# Patient Record
Sex: Female | Born: 1985
Health system: Southern US, Community
[De-identification: ages and names within clinical notes are randomized; demographics above are authoritative.]

## PROBLEM LIST (undated history)

## (undated) DIAGNOSIS — E039 Hypothyroidism, unspecified: Secondary | ICD-10-CM

## (undated) DIAGNOSIS — N946 Dysmenorrhea, unspecified: Secondary | ICD-10-CM

## (undated) DIAGNOSIS — D649 Anemia, unspecified: Secondary | ICD-10-CM

## (undated) DIAGNOSIS — F32A Depression, unspecified: Secondary | ICD-10-CM

## (undated) DIAGNOSIS — G43909 Migraine, unspecified, not intractable, without status migrainosus: Secondary | ICD-10-CM

## (undated) DIAGNOSIS — D069 Carcinoma in situ of cervix, unspecified: Secondary | ICD-10-CM

## (undated) DIAGNOSIS — N879 Dysplasia of cervix uteri, unspecified: Secondary | ICD-10-CM

## (undated) DIAGNOSIS — F419 Anxiety disorder, unspecified: Secondary | ICD-10-CM

## (undated) DIAGNOSIS — R87619 Unspecified abnormal cytological findings in specimens from cervix uteri: Secondary | ICD-10-CM

## (undated) HISTORY — PX: COLPOSCOPY: SHX161

## (undated) HISTORY — DX: Dysplasia of cervix uteri, unspecified: N87.9

## (undated) HISTORY — DX: Unspecified abnormal cytological findings in specimens from cervix uteri: R87.619

## (undated) HISTORY — DX: Dysmenorrhea, unspecified: N94.6

## (undated) HISTORY — DX: Carcinoma in situ of cervix, unspecified: D06.9

---
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
## 2005-02-05 ENCOUNTER — Emergency Department: Payer: Self-pay | Admitting: Unknown Physician Specialty

## 2005-04-19 ENCOUNTER — Observation Stay: Payer: Self-pay | Admitting: Obstetrics & Gynecology

## 2005-07-05 ENCOUNTER — Inpatient Hospital Stay: Payer: Self-pay

## 2009-06-06 ENCOUNTER — Ambulatory Visit: Payer: Self-pay

## 2010-01-01 ENCOUNTER — Emergency Department: Payer: Self-pay | Admitting: Unknown Physician Specialty

## 2010-12-20 DIAGNOSIS — D069 Carcinoma in situ of cervix, unspecified: Secondary | ICD-10-CM

## 2010-12-20 HISTORY — PX: LEEP: SHX91

## 2010-12-20 HISTORY — DX: Carcinoma in situ of cervix, unspecified: D06.9

## 2013-05-26 HISTORY — PX: CHOLECYSTECTOMY: SHX55

## 2014-07-18 ENCOUNTER — Ambulatory Visit: Payer: Self-pay | Admitting: Emergency Medicine

## 2014-07-18 IMAGING — US ABDOMEN ULTRASOUND LIMITED
1 series · 14 of 25 positions shown · non-contrast
Comparison: None.

CLINICAL DATA: Epigastric pain for 3 day

EXAM:
US ABDOMEN LIMITED - RIGHT UPPER QUADRANT

[Series 1: abdomen ultrasound limited · 0.20mm/px · 14 of 64 slices shown]
[im 1/64]
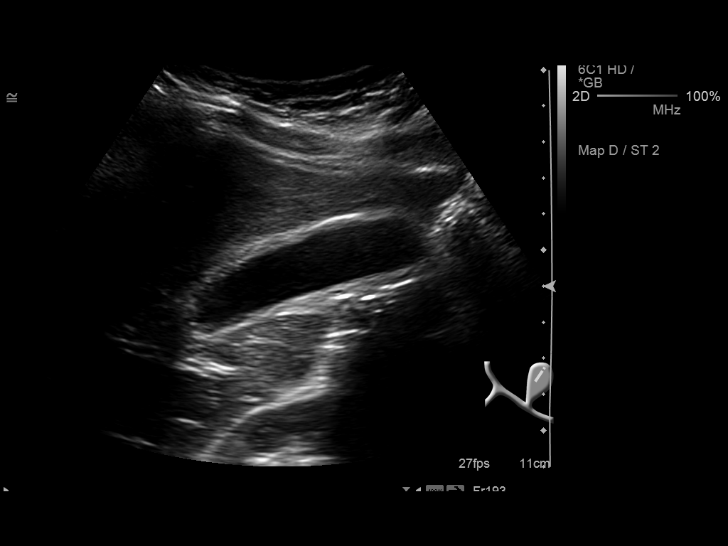
[im 6/64]
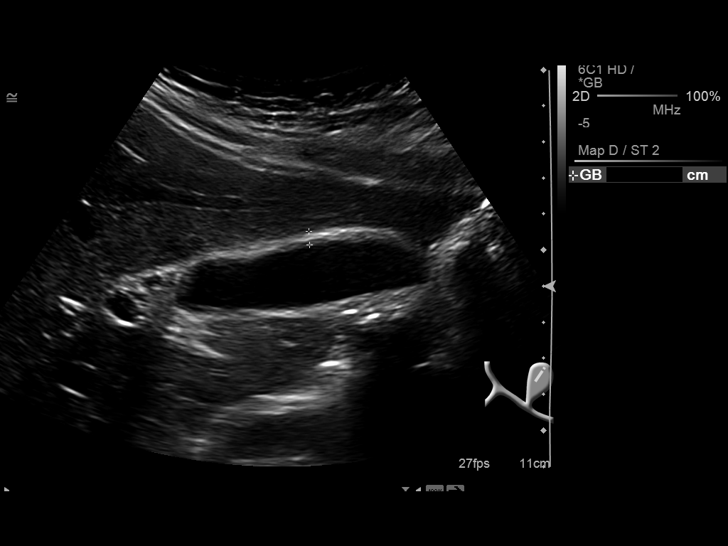
[im 11/64]
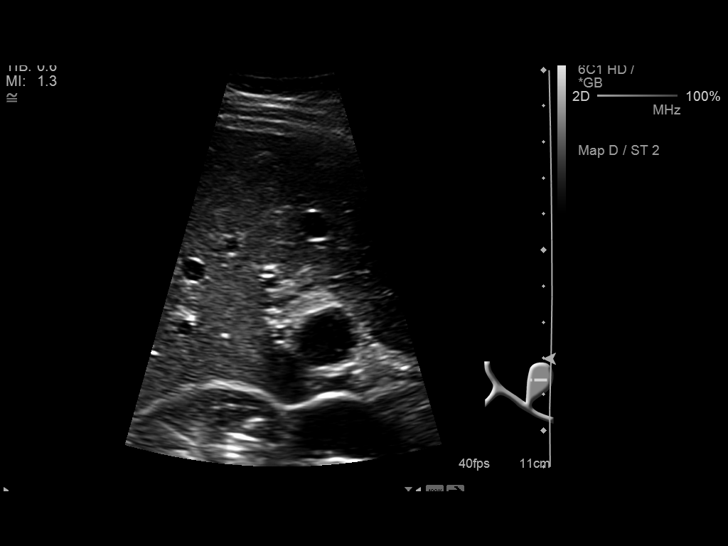
[im 16/64]
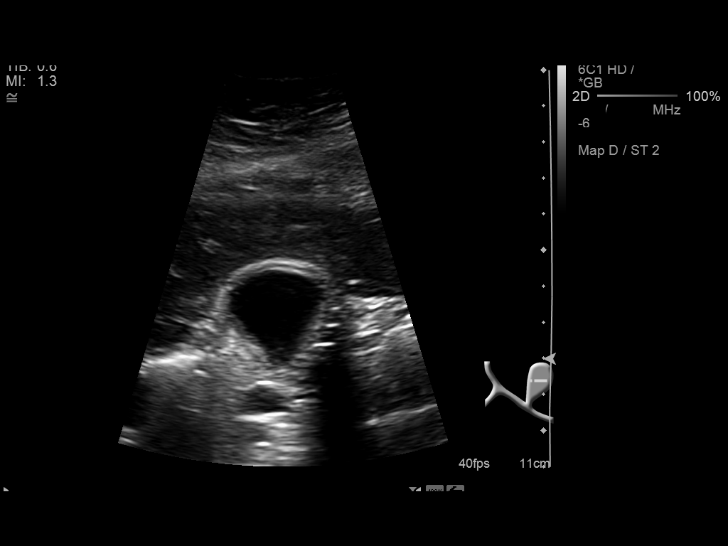
[im 22/64]
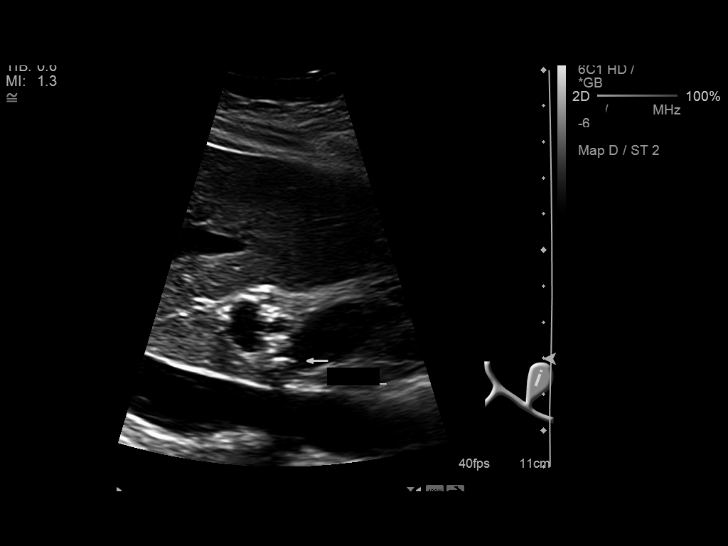
[im 24/64]
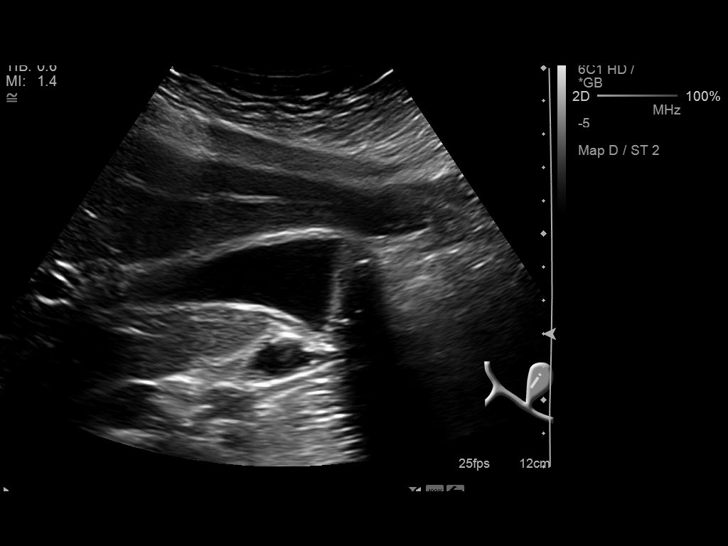
[im 29/64]
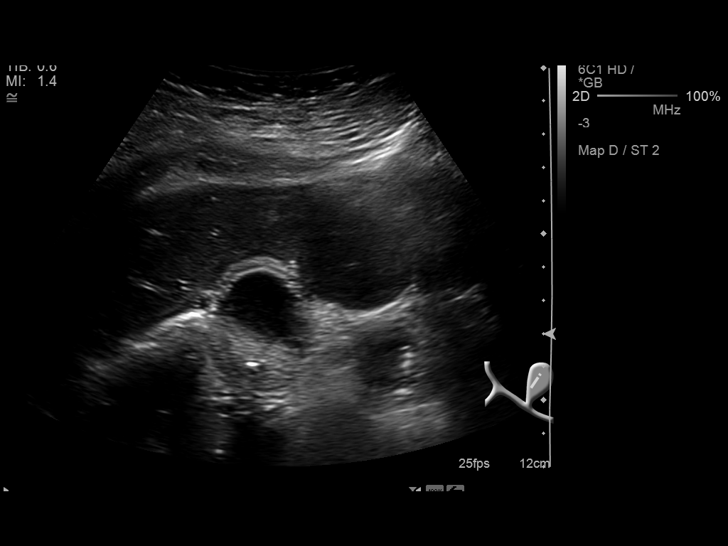
[im 35/64]
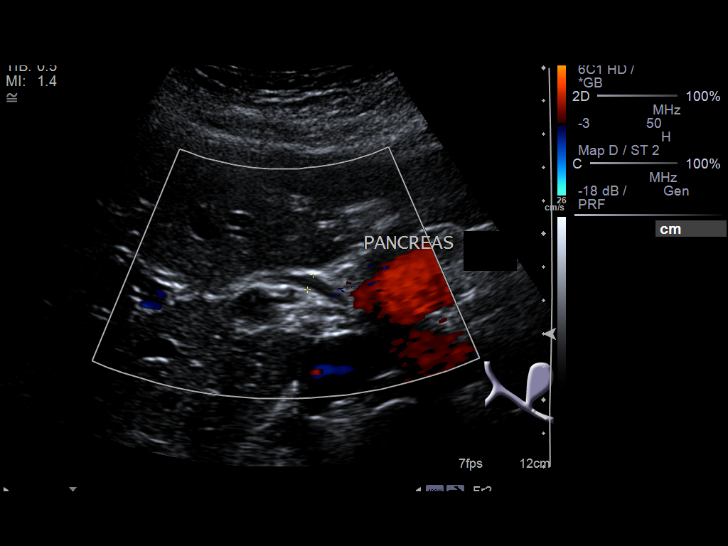
[im 40/64]
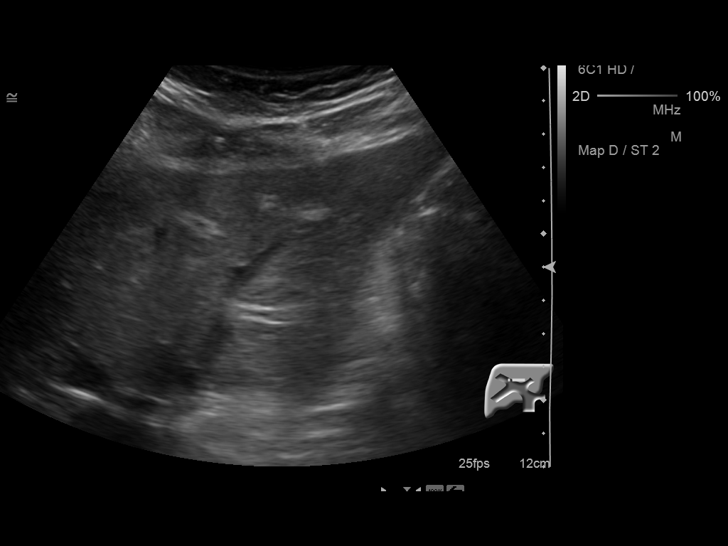
[im 43/64]
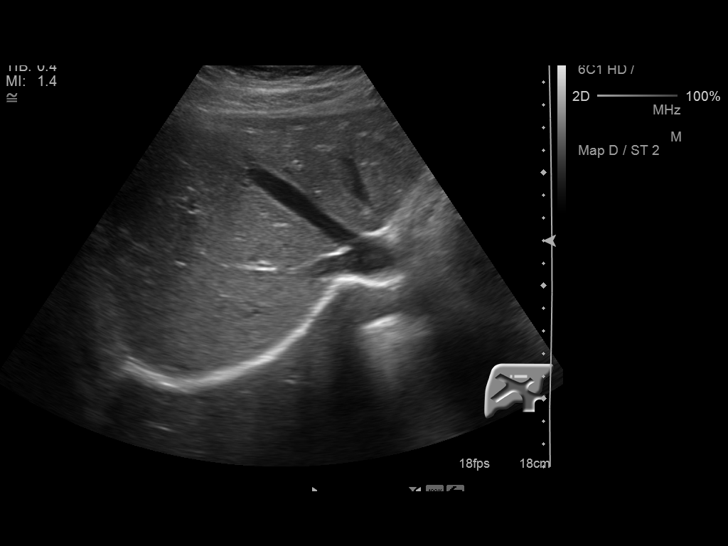
[im 48/64]
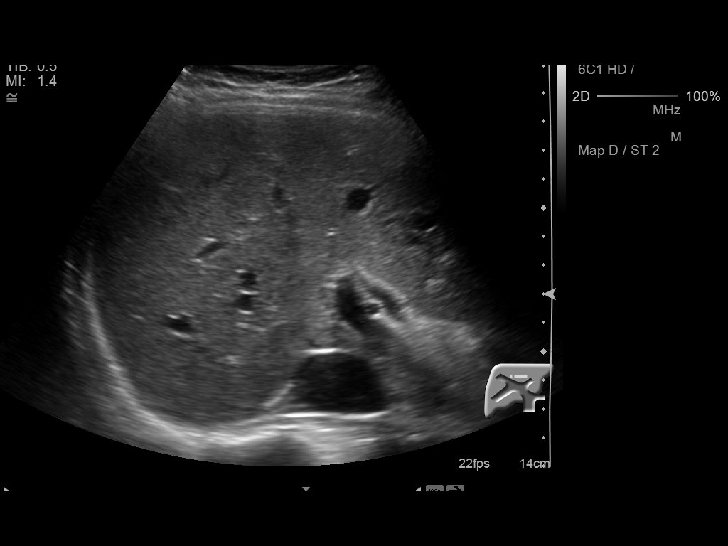
[im 53/64]
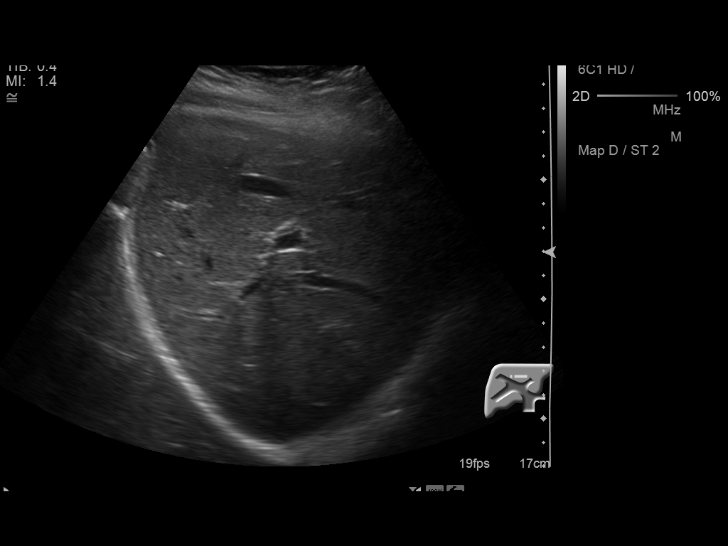
[im 58/64]
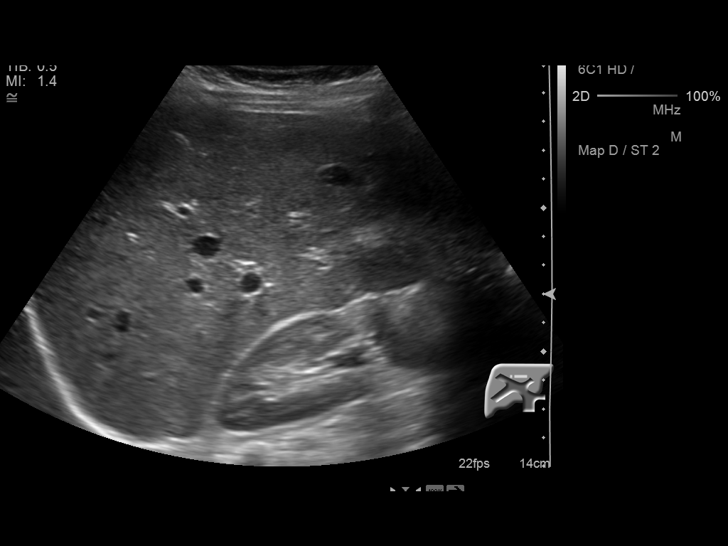
[im 64/64]
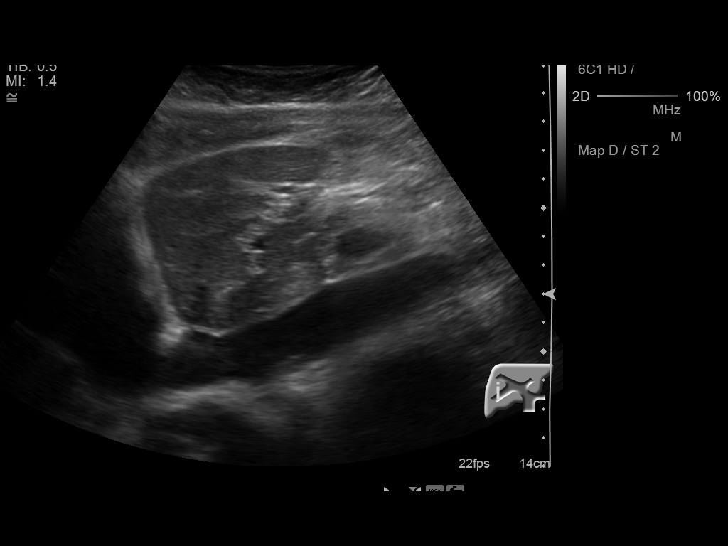

[14 of 25 positions shown; findings below may reference images not displayed]

FINDINGS: Gallbladder:

Several mobile small gallstones are present. There is a nonmobile
gallstone in the neck. The wall of the gallbladder is thickened
measuring 4 mm. A small amount of pericholecystic fluid is present.
Murphy sign was negative but the patient was given pain medication.

Common bile duct:

Diameter: 7 mm

Liver:

No focal lesion identified. Within normal limits in parenchymal
echogenicity.
IMPRESSION: Cholelithiasis.

The gallbladder is thickened with some pericholecystic fluid.
Correlate clinically as for the need for nuclear medicine imaging to
evaluate for cystic duct patency.

The common bile duct is mildly dilated. A common bile duct stone is
not excluded. Correlate with liver function tests as for the need
for MRCP.

## 2014-07-20 ENCOUNTER — Ambulatory Visit: Payer: Self-pay | Admitting: Surgery

## 2014-09-18 LAB — SURGICAL PATHOLOGY

## 2014-09-24 NOTE — Op Note (Signed)
PATIENT NAME:  Christy Whitehead, Trang M MR#:  [id number] DATE OF BIRTH:  Jul 01, 1985  DATE OF PROCEDURE:  07/20/2014  ATTENDING PHYSICIAN: Cristal Deerhristopher A. Hilman Kissling, MD   PREOPERATIVE DIAGNOSIS: Acute cholecystitis.   POSTOPERATIVE DIAGNOSIS: Acute cholecystitis.  PROCEDURE PERFORMED: Laparoscopic cholecystectomy.   ANESTHESIA: General.   ESTIMATED BLOOD LOSS: 25 mL.   COMPLICATIONS: None.   SPECIMENS: Gallbladder.   INDICATION FOR SURGERY: Ms. [name] is a 29 year old female who presents with 4 days of abdominal pain and ultrasound that shows an impacted stone at the neck of her gallbladder as well as an elevated white blood cell count. She was brought to the operating room for laparoscopic cholecystectomy.   DETAILS OF PROCEDURE: Informed consent was obtained. Ms. [name] was brought to the operating room suite. She was induced. Endotracheal tube was placed, general anesthesia was administered. Her abdomen was then prepped and draped in standard surgical fashion. A timeout was then performed correctly identifying the patient name, operative site and procedure to be performed.   A supraumbilical incision was made deepened down to the fascia. The fascia was incised. The peritoneum was entered. Two stay sutures were placed through the fasciotomy. Hassan trocar was placed in the abdomen. The abdomen was insufflated. An 11 mm epigastric and two 5 mm subcostal trocars were placed. The colon was then lifted up over the gallbladder fossa. It did have moderate inflammation. The cystic duct and cystic artery were dissected out. They were clipped and ligated, after the critical view was obtained. Gallbladder was then taken off the gallbladder fossa. It was inadvertently entered and there was creamy what looked like purulence that came out of the gallbladder. It was then brought out through an Endo Catch bag. The abdomen was then irrigated and hemostasis was obtained. Following this, the abdomen was  desufflated. All trocars were taken out under direct visualization. The supraumbilical fascia was closed with figure-of-eight 0 Vicryl. All skin sites were then closed with 4-0 Monocryl deep dermal. The wounds were dressed with Steri-Strips, Telfa gauze and Tegaderm. The patient was then awoken, extubated and brought to the postanesthesia care unit. There were no immediate complications. Needle, sponge, and instrument counts were correct at the end of the procedure.     ____________________________ Si Raiderhristopher A. Aryahna Spagna, MD cal:TM D: 07/20/2014 13:37:21 ET T: 07/21/2014 00:16:13 ET JOB#: [id number]  cc: Cristal Deerhristopher A. Neysha Criado, MD, <Dictator> Jarvis NewcomerHRISTOPHER A Angle Karel MD ELECTRONICALLY SIGNED 08/01/2014 11:25

## 2015-09-22 ENCOUNTER — Emergency Department (HOSPITAL_COMMUNITY)
Admission: EM | Admit: 2015-09-22 | Discharge: 2015-09-22 | Disposition: A | Discharge status: Home / Self Care | Payer: 59 | Attending: Emergency Medicine | Admitting: Emergency Medicine

## 2015-09-22 ENCOUNTER — Encounter (HOSPITAL_COMMUNITY): Payer: Self-pay | Admitting: *Deleted

## 2015-09-22 DIAGNOSIS — R21 Rash and other nonspecific skin eruption: Secondary | ICD-10-CM | POA: Diagnosis present

## 2015-09-22 DIAGNOSIS — L509 Urticaria, unspecified: Secondary | ICD-10-CM | POA: Diagnosis not present

## 2015-09-22 DIAGNOSIS — R110 Nausea: Secondary | ICD-10-CM | POA: Insufficient documentation

## 2015-09-22 DIAGNOSIS — G43909 Migraine, unspecified, not intractable, without status migrainosus: Secondary | ICD-10-CM | POA: Insufficient documentation

## 2015-09-22 HISTORY — DX: Migraine, unspecified, not intractable, without status migrainosus: G43.909

## 2015-09-22 MED ORDER — HYDROXYZINE HCL 25 MG PO TABS
25.0000 mg | ORAL_TABLET | Freq: Once | ORAL | Status: AC
  Administered 2015-09-22: 25 mg via ORAL
  Filled 2015-09-22: qty 1

## 2015-09-22 MED ORDER — HYDROXYZINE HCL 25 MG PO TABS: 25.0000 mg | ORAL_TABLET | Freq: Four times a day (QID) | ORAL | Status: DC | PRN

## 2015-09-22 MED ORDER — PREDNISONE 20 MG PO TABS
60.0000 mg | ORAL_TABLET | Freq: Once | ORAL | Status: AC
  Administered 2015-09-22: 60 mg via ORAL
  Filled 2015-09-22: qty 3

## 2015-09-22 MED ORDER — PREDNISONE 20 MG PO TABS: 40.0000 mg | ORAL_TABLET | Freq: Every day | ORAL | Status: DC

## 2015-09-22 NOTE — ED Notes (Signed)
PT reports she was spraying the yard with bug spray . Pt reports an allergic reaction to spray . Pt reports hives all over.

## 2015-09-22 NOTE — ED Notes (Signed)
Pt verbalized understanding of d/c instructions. Pt in NAD. Airway intact. Pt d/c home with family to follow up with pcp. Pt educated on prescription use.

## 2015-09-22 NOTE — ED Provider Notes (Signed)
CSN: [id number]     Arrival date & time 09/22/15  [id number] History  By signing my name below, I, Christy Whitehead, attest that this documentation has been prepared under the direction and in the presence of Roxy Horsemanobert Adah Stoneberg, PA-C. Electronically Signed: Angelene GiovanniEmmanuella Whitehead, ED Scribe. 09/22/2015. 7:12 PM.    No chief complaint on file.  The history is provided by the patient. No language interpreter was used.   HPI Comments: Christy Whitehead is a 30 y.o. female who presents to the Emergency Department complaining of gradually spreading itchy hives to her upper extremities s/p possible allergic reaction that occurred PTA. She reports associated nausea. She explains that she was working in the yard prior to onset. She states that she took Benadryl and Pepcid with mild relief. Pt denies any fever, chills, wheezing, SOB, or vomiting.    No past medical history on file. No past surgical history on file. No family history on file. Social History  Substance Use Topics  . Smoking status: Not on file  . Smokeless tobacco: Not on file  . Alcohol Use: Not on file   OB History    No data available     Review of Systems  Constitutional: Negative for fever and chills.  Respiratory: Negative for shortness of breath and wheezing.   Gastrointestinal: Positive for nausea. Negative for vomiting.  Skin: Positive for rash.      Allergies  Review of patient's allergies indicates not on file.  Home Medications   Prior to Admission medications   Not on File   BP 130/78 mmHg  Pulse 102  Temp(Src) 98 F (36.7 C) (Oral)  Resp 18  SpO2 100% Physical Exam  Constitutional: She is oriented to person, place, and time. She appears well-developed and well-nourished.  HENT:  Head: Normocephalic and atraumatic.  Oropharynx is clear, nonedematous, no stridor, airway intact  Eyes: Conjunctivae and EOM are normal. Pupils are equal, round, and reactive to light.  Neck: Normal range of motion. Neck  supple.  Cardiovascular: Normal rate and regular rhythm.  Exam reveals no gallop and no friction rub.   No murmur heard. Pulmonary/Chest: Effort normal and breath sounds normal. No respiratory distress. She has no wheezes. She has no rales. She exhibits no tenderness.  Clear to auscultation bilaterally  Abdominal: Soft. Bowel sounds are normal. She exhibits no distension and no mass. There is no tenderness. There is no rebound and no guarding.  Musculoskeletal: Normal range of motion. She exhibits no edema or tenderness.  Neurological: She is alert and oriented to person, place, and time.  Skin: Skin is warm and dry.  Diffuse urticaria on extremities and torso  Psychiatric: She has a normal mood and affect. Her behavior is normal. Judgment and thought content normal.  Nursing note and vitals reviewed.   ED Course  Procedures (including critical care time) DIAGNOSTIC STUDIES: Oxygen Saturation is 100% on RA, normal by my interpretation.    COORDINATION OF CARE: 7:10 PM- Pt advised of plan for treatment and pt agrees. Pt will receive Prednisone and Atarax for relief.   MDM   Final diagnoses:  Urticaria    Patient with diffuse urticaria.  No wheezing.  No v/d.  No throat swelling.  Patient took benadryl and pepcid at home.  Starting to get some relief.  Will give prednisone and atarax.  Patient seen by and discussed with Dr. Criss AlvineGoldston, who agrees with the plan.  7:51 PM Patient reassessed. Feeling improved. No wheezing. Airway is patent. Not in any  apparent distress.  I personally performed the services described in this documentation, which was scribed in my presence. The recorded information has been reviewed and is accurate.     Roxy Horseman, PA-C 09/22/15 1951  Pricilla Loveless, MD 09/26/15 [phone]

## 2015-09-22 NOTE — Discharge Instructions (Signed)
Hives Hives are itchy, red, swollen areas of the skin. They can vary in size and location on your body. Hives can come and go for hours or several days (acute hives) or for several weeks (chronic hives). Hives do not spread from person to person (noncontagious). They may get worse with scratching, exercise, and emotional stress. CAUSES   Allergic reaction to food, additives, or drugs.  Infections, including the common cold.  Illness, such as vasculitis, lupus, or thyroid disease.  Exposure to sunlight, heat, or cold.  Exercise.  Stress.  Contact with chemicals. SYMPTOMS   Red or white swollen patches on the skin. The patches may change size, shape, and location quickly and repeatedly.  Itching.  Swelling of the hands, feet, and face. This may occur if hives develop deeper in the skin. DIAGNOSIS  Your caregiver can usually tell what is wrong by performing a physical exam. Skin or blood tests may also be done to determine the cause of your hives. In some cases, the cause cannot be determined. TREATMENT  Mild cases usually get better with medicines such as antihistamines. Severe cases may require an emergency epinephrine injection. If the cause of your hives is known, treatment includes avoiding that trigger.  HOME CARE INSTRUCTIONS   Avoid causes that trigger your hives.  Take antihistamines as directed by your caregiver to reduce the severity of your hives. Non-sedating or low-sedating antihistamines are usually recommended. Do not drive while taking an antihistamine.  Take any other medicines prescribed for itching as directed by your caregiver.  Wear loose-fitting clothing.  Keep all follow-up appointments as directed by your caregiver. SEEK MEDICAL CARE IF:   You have persistent or severe itching that is not relieved with medicine.  You have painful or swollen joints. SEEK IMMEDIATE MEDICAL CARE IF:   You have a fever.  Your tongue or lips are swollen.  You have  trouble breathing or swallowing.  You feel tightness in the throat or chest.  You have abdominal pain. These problems may be the first sign of a life-threatening allergic reaction. Call your local emergency services (911 in [place] MAKE SURE YOU:   Understand these instructions.  Will watch your condition.  Will get help right away if you are not doing well or get worse.   This information is not intended to replace advice given to you by your health care provider. Make sure you discuss any questions you have with your health care provider.   Document Released: 05/12/2005 Document Revised: 05/17/2013 Document Reviewed: 08/05/2011 Elsevier Interactive Patient Education 2016 [employer] Inc.  

## 2015-10-02 DIAGNOSIS — G43719 Chronic migraine without aura, intractable, without status migrainosus: Secondary | ICD-10-CM | POA: Diagnosis not present

## 2016-03-20 DIAGNOSIS — H5213 Myopia, bilateral: Secondary | ICD-10-CM | POA: Diagnosis not present

## 2016-05-13 DIAGNOSIS — G43019 Migraine without aura, intractable, without status migrainosus: Secondary | ICD-10-CM | POA: Diagnosis not present

## 2016-05-13 DIAGNOSIS — G43719 Chronic migraine without aura, intractable, without status migrainosus: Secondary | ICD-10-CM | POA: Diagnosis not present

## 2016-06-06 DIAGNOSIS — Z3044 Encounter for surveillance of vaginal ring hormonal contraceptive device: Secondary | ICD-10-CM | POA: Diagnosis not present

## 2016-06-06 DIAGNOSIS — Z01419 Encounter for gynecological examination (general) (routine) without abnormal findings: Secondary | ICD-10-CM | POA: Diagnosis not present

## 2016-06-06 DIAGNOSIS — Z124 Encounter for screening for malignant neoplasm of cervix: Secondary | ICD-10-CM | POA: Diagnosis not present

## 2016-06-06 DIAGNOSIS — N393 Stress incontinence (female) (male): Secondary | ICD-10-CM | POA: Diagnosis not present

## 2016-06-06 DIAGNOSIS — Z8742 Personal history of other diseases of the female genital tract: Secondary | ICD-10-CM | POA: Diagnosis not present

## 2016-06-06 DIAGNOSIS — Z1151 Encounter for screening for human papillomavirus (HPV): Secondary | ICD-10-CM | POA: Diagnosis not present

## 2016-08-28 ENCOUNTER — Ambulatory Visit: Payer: 59 | Admitting: Physical Therapy

## 2016-09-04 ENCOUNTER — Ambulatory Visit: Payer: 59 | Attending: Obstetrics and Gynecology | Admitting: Physical Therapy

## 2016-09-11 ENCOUNTER — Encounter: Payer: 59 | Admitting: Physical Therapy

## 2016-09-16 ENCOUNTER — Encounter: Payer: 59 | Admitting: Physical Therapy

## 2016-09-25 ENCOUNTER — Encounter: Payer: 59 | Admitting: Physical Therapy

## 2016-10-07 ENCOUNTER — Encounter: Payer: 59 | Admitting: Physical Therapy

## 2016-10-09 ENCOUNTER — Encounter: Payer: 59 | Admitting: Physical Therapy

## 2016-10-23 ENCOUNTER — Encounter: Payer: 59 | Admitting: Physical Therapy

## 2016-11-06 ENCOUNTER — Encounter: Payer: 59 | Admitting: Physical Therapy

## 2017-02-26 DIAGNOSIS — G43719 Chronic migraine without aura, intractable, without status migrainosus: Secondary | ICD-10-CM | POA: Diagnosis not present

## 2017-02-26 DIAGNOSIS — G43019 Migraine without aura, intractable, without status migrainosus: Secondary | ICD-10-CM | POA: Diagnosis not present

## 2017-03-27 DIAGNOSIS — H5213 Myopia, bilateral: Secondary | ICD-10-CM | POA: Diagnosis not present

## 2017-04-23 DIAGNOSIS — G43019 Migraine without aura, intractable, without status migrainosus: Secondary | ICD-10-CM | POA: Diagnosis not present

## 2017-04-23 DIAGNOSIS — G43719 Chronic migraine without aura, intractable, without status migrainosus: Secondary | ICD-10-CM | POA: Diagnosis not present

## 2017-07-02 ENCOUNTER — Ambulatory Visit
Admission: EM | Admit: 2017-07-02 | Discharge: 2017-07-02 | Disposition: A | Discharge status: Home / Self Care | Payer: 59 | Attending: Family Medicine | Admitting: Family Medicine

## 2017-07-02 ENCOUNTER — Encounter: Payer: Self-pay | Admitting: *Deleted

## 2017-07-02 ENCOUNTER — Other Ambulatory Visit: Payer: Self-pay

## 2017-07-02 DIAGNOSIS — J029 Acute pharyngitis, unspecified: Secondary | ICD-10-CM | POA: Diagnosis not present

## 2017-07-02 DIAGNOSIS — B349 Viral infection, unspecified: Secondary | ICD-10-CM | POA: Diagnosis not present

## 2017-07-02 DIAGNOSIS — R0981 Nasal congestion: Secondary | ICD-10-CM | POA: Diagnosis not present

## 2017-07-02 DIAGNOSIS — R51 Headache: Secondary | ICD-10-CM

## 2017-07-02 LAB — RAPID STREP SCREEN (MED CTR MEBANE ONLY): STREPTOCOCCUS, GROUP A SCREEN (DIRECT): NEGATIVE

## 2017-07-02 NOTE — ED Provider Notes (Signed)
[facility]    CSN: [id number] Arrival date & time: 07/02/17  0818     History   Chief Complaint Chief Complaint  Patient presents with  . Sore Throat  . Nasal Congestion  . Headache    HPI [name] is a 32 y.o. female.   The history is provided by the patient. No language interpreter was used.  Sore Throat  This is a new problem. The current episode started 2 days ago. The problem occurs constantly. The problem has not changed since onset.Associated symptoms include headaches. Nothing aggravates the symptoms. Treatments tried: cold and flu OTC. The treatment provided mild relief.    Past Medical History:  Diagnosis Date  . Migraines     Patient Active Problem List   Diagnosis Date Noted  . Acute pharyngitis 07/02/2017  . Viral illness 07/02/2017  . Migraines     Past Surgical History:  Procedure Laterality Date  . CHOLECYSTECTOMY      OB History    No data available       Home Medications    Prior to Admission medications   Medication Sig Start Date End Date Taking? Authorizing Provider  hydrOXYzine (ATARAX/VISTARIL) 25 MG tablet Take 1 tablet (25 mg total) by mouth every 6 (six) hours as needed. 09/22/15   Roxy HorsemanBrowning, Robert, PA-C  predniSONE (DELTASONE) 20 MG tablet Take 2 tablets (40 mg total) by mouth daily. 09/22/15   Roxy HorsemanBrowning, Robert, PA-C    Family History Family History  Problem Relation Age of Onset  . Diabetes Father   . Hypertension Father     Social History Social History   Tobacco Use  . Smoking status: Never Smoker  . Smokeless tobacco: Never Used  Substance Use Topics  . Alcohol use: Yes    Comment: social  . Drug use: No     Allergies   Patient has no known allergies.   Review of Systems Review of Systems  Constitutional: Negative for fever.  HENT: Positive for congestion and sore throat.   Eyes: Negative.   Respiratory: Negative for cough and wheezing.   Cardiovascular: Negative.     Gastrointestinal: Negative.   Musculoskeletal: Positive for myalgias.  Skin: Negative.   Neurological: Positive for headaches.  Hematological: Negative.   Psychiatric/Behavioral: Negative.   All other systems reviewed and are negative.    Physical Exam Triage Vital Signs ED Triage Vitals  Enc Vitals Group     BP 07/02/17 0904 118/78     Pulse Rate 07/02/17 0904 73     Resp 07/02/17 0904 16     Temp 07/02/17 0904 98.1 F (36.7 C)     Temp Source 07/02/17 0904 Oral     SpO2 07/02/17 0904 99 %     Weight 07/02/17 0906 158 lb (71.7 kg)     Height 07/02/17 0906 5\' 4"  (1.626 m)     Head Circumference --      Peak Flow --      Pain Score 07/02/17 0906 7     Pain Loc --      Pain Edu? --      Excl. in GC? --    No data found.  Updated Vital Signs BP 118/78 (BP Location: Left Arm)   Pulse 73   Temp 98.1 F (36.7 C) (Oral)   Resp 16   Ht 5\' 4"  (1.626 m)   Wt 158 lb (71.7 kg)   LMP 06/03/2017   SpO2 99%   BMI 27.12 kg/m  Visual Acuity  Physical Exam  Constitutional: She is oriented to person, place, and time. She appears well-developed and well-nourished. She is active and cooperative. No distress.  HENT:  Head: Normocephalic.  Mouth/Throat: Oropharynx is clear and moist.  Eyes: Conjunctivae, EOM and lids are normal. Pupils are equal, round, and reactive to light.  Neck: Normal range of motion. No tracheal deviation present.  Cardiovascular: Regular rhythm, normal heart sounds and normal pulses.  No murmur heard. Pulmonary/Chest: Effort normal and breath sounds normal.  Abdominal: Soft. Bowel sounds are normal. There is no tenderness.  Musculoskeletal: Normal range of motion.  Lymphadenopathy:    She has no cervical adenopathy.  Neurological: She is alert and oriented to person, place, and time. GCS eye subscore is 4. GCS verbal subscore is 5. GCS motor subscore is 6.  Skin: Skin is warm and dry. No rash noted.  Psychiatric: She has a normal mood and affect. Her  speech is normal and behavior is normal.  Nursing note and vitals reviewed.    UC Treatments / Results  Labs (all labs ordered are listed, but only abnormal results are displayed) Labs Reviewed  RAPID STREP SCREEN (NOT AT [facility])  CULTURE, GROUP A STREP [facility])    EKG  EKG Interpretation None       Radiology No results found.  Procedures Procedures (including critical care time)  Medications Ordered in UC Medications - No data to display   Initial Impression / Assessment and Plan / UC Course  I have reviewed the triage vital signs and the nursing notes.  Pertinent labs & imaging results that were available during my care of the patient were reviewed by me and considered in my medical decision making (see chart for details).    Rest,push fluids, take meds as directed. Your strep test was negative in office today,culture pending. Return to UC as needed. Pt verbalized understanding to this provider. Work note given.   Final Clinical Impressions(s) / UC Diagnoses   Final diagnoses:  Acute pharyngitis, unspecified etiology  Viral illness    ED Discharge Orders    None       Controlled Substance Prescriptions    Denym Rahimi, NP 07/02/17 1052

## 2017-07-02 NOTE — ED Triage Notes (Signed)
Patient started having symptoms of sore throat, nasal congestion, and headache 2 days ago. Patient has a history of migraine headaches.

## 2017-07-02 NOTE — Discharge Instructions (Addendum)
Rest,push fluids, take meds as directed. Your strep test was negative in office today,culture pending. Return to UC as needed.

## 2017-07-04 LAB — CULTURE, GROUP A STREP (THRC)

## 2017-08-18 ENCOUNTER — Other Ambulatory Visit: Payer: Self-pay | Admitting: Obstetrics and Gynecology

## 2017-08-19 ENCOUNTER — Other Ambulatory Visit: Payer: Self-pay | Admitting: Obstetrics and Gynecology

## 2017-08-19 ENCOUNTER — Telehealth: Payer: Self-pay

## 2017-08-19 MED ORDER — ETONOGESTREL-ETHINYL ESTRADIOL 0.12-0.015 MG/24HR VA RING: VAGINAL_RING | VAGINAL | 0 refills | Status: DC

## 2017-08-19 NOTE — Telephone Encounter (Signed)
Rx eRxd. RN to notify pt

## 2017-08-19 NOTE — Progress Notes (Signed)
Rx RF till annual 

## 2017-08-19 NOTE — Telephone Encounter (Signed)
Pt called triage line this morning stating she has an annual scheduled for 4/9 with Helmut Musterlicia and needs a refill of the [place] called [facility].   I do not see it on her medication list. Please advise. KJ CMA

## 2017-08-19 NOTE — Telephone Encounter (Signed)
Pt aware Rx has been sent to pharmacy 

## 2017-08-25 DIAGNOSIS — G43719 Chronic migraine without aura, intractable, without status migrainosus: Secondary | ICD-10-CM | POA: Diagnosis not present

## 2017-08-25 DIAGNOSIS — G43019 Migraine without aura, intractable, without status migrainosus: Secondary | ICD-10-CM | POA: Diagnosis not present

## 2017-09-01 ENCOUNTER — Encounter: Payer: Self-pay | Admitting: Obstetrics and Gynecology

## 2017-09-01 ENCOUNTER — Ambulatory Visit (INDEPENDENT_AMBULATORY_CARE_PROVIDER_SITE_OTHER): Payer: 59 | Admitting: Obstetrics and Gynecology

## 2017-09-01 VITALS — BP 100/66 | HR 60 | Ht 64.0 in | Wt 161.0 lb

## 2017-09-01 DIAGNOSIS — Z01419 Encounter for gynecological examination (general) (routine) without abnormal findings: Secondary | ICD-10-CM | POA: Diagnosis not present

## 2017-09-01 DIAGNOSIS — Z1151 Encounter for screening for human papillomavirus (HPV): Secondary | ICD-10-CM

## 2017-09-01 DIAGNOSIS — Z124 Encounter for screening for malignant neoplasm of cervix: Secondary | ICD-10-CM

## 2017-09-01 DIAGNOSIS — Z3044 Encounter for surveillance of vaginal ring hormonal contraceptive device: Secondary | ICD-10-CM | POA: Diagnosis not present

## 2017-09-01 MED ORDER — ETONOGESTREL-ETHINYL ESTRADIOL 0.12-0.015 MG/24HR VA RING
VAGINAL_RING | VAGINAL | 11 refills | Status: DC
Start: 2017-09-01 — End: 2018-12-21

## 2017-09-01 NOTE — Patient Instructions (Signed)
I value your feedback and entrusting us with your care. If you get a [facility] patient survey, I would appreciate you taking the time to let us know about your experience today. Thank you! 

## 2017-09-01 NOTE — Progress Notes (Signed)
PCP:  Patient, No Pcp Per   Chief Complaint  Patient presents with  . Gynecologic Exam     HPI:      [name] is a 32 y.o. G1P1001 who LMP was Patient's last menstrual period was 08/14/2017 (exact date)., presents today for her annual examination.  Her menses are Q5 wks, lasting 5 days.  Dysmenorrhea none. She does not have intermenstrual bleeding.  Sex activity: single partner, contraception - NuvaRing vaginal inserts.  Last Pap: June 06, 2016  Results were: no abnormalities /neg HPV DNA  Hx of STDs: HPV--hx of CIN3 with LEEP 2012  There is a FH of breast cancer in her PGM, genetic testing not indicated. There is no FH of ovarian cancer. The patient does not do self-breast exams.  Tobacco use: The patient denies current or previous tobacco use. Alcohol use: social drinker No drug use.  Exercise: moderately active  She does get adequate calcium but not Vitamin D in her diet. SUI sx from last yr tolerable if pt voids regularly. Didn't see PT and is ok for now.    Past Medical History:  Diagnosis Date  . Abnormal Pap smear of cervix 12/19/2008, 02/20/2009   ASCUS  . Abnormal Pap smear of cervix 08/06/2010, 10/01/2010   HGSIL  . Cervical dysplasia CIN1 06/05/2004  . CIN III (cervical intraepithelial neoplasia grade III) with severe dysplasia 12/20/2010   LEEP Ecto Cervix  . Dysmenorrhea   . Migraines     Past Surgical History:  Procedure Laterality Date  . CHOLECYSTECTOMY  2015  . COLPOSCOPY  06/05/2004,02/20/2009,10/01/2010  . LEEP  12/20/2010   ECC &Endo Cx Negative, Ecto CIN 2-3    Family History  Problem Relation Age of Onset  . Diabetes Father   . Hypertension Father   . Breast cancer Paternal Grandmother 29    Social History   Socioeconomic History  . Marital status: Single    Spouse name: Not on file  . Number of children: Not on file  . Years of education: Not on file  . Highest education level: Not on file  Occupational History    . Not on file  Social Needs  . Financial resource strain: Not on file  . Food insecurity:    Worry: Not on file    Inability: Not on file  . Transportation needs:    Medical: Not on file    Non-medical: Not on file  Tobacco Use  . Smoking status: Never Smoker  . Smokeless tobacco: Never Used  Substance and Sexual Activity  . Alcohol use: Yes    Comment: social  . Drug use: No  . Sexual activity: Yes    Birth control/protection: Inserts  Lifestyle  . Physical activity:    Days per week: Not on file    Minutes per session: Not on file  . Stress: Not on file  Relationships  . Social connections:    Talks on phone: Not on file    Gets together: Not on file    Attends religious service: Not on file    Active member of club or organization: Not on file    Attends meetings of clubs or organizations: Not on file    Relationship status: Not on file  . Intimate partner violence:    Fear of current or ex partner: Not on file    Emotionally abused: Not on file    Physically abused: Not on file    Forced sexual activity: Not on  file  Other Topics Concern  . Not on file  Social History Narrative  . Not on file    Outpatient Medications Prior to Visit  Medication Sig Dispense Refill  . methocarbamol (ROBAXIN) 500 MG tablet   1  . topiramate (TOPAMAX) 25 MG tablet   2  . etonogestrel-ethinyl estradiol (NUVARING) 0.12-0.015 MG/24HR vaginal ring Insert vaginally and leave in place for 3 consecutive weeks, then remove for 1 week. 1 each 0  . hydrOXYzine (ATARAX/VISTARIL) 25 MG tablet Take 1 tablet (25 mg total) by mouth every 6 (six) hours as needed. 12 tablet 0  . predniSONE (DELTASONE) 20 MG tablet Take 2 tablets (40 mg total) by mouth daily. 10 tablet 0   No facility-administered medications prior to visit.       ROS:  Review of Systems  Constitutional: Negative for fatigue, fever and unexpected weight change.  Respiratory: Negative for cough, shortness of breath and  wheezing.   Cardiovascular: Negative for chest pain, palpitations and leg swelling.  Gastrointestinal: Negative for blood in stool, constipation, diarrhea, nausea and vomiting.  Endocrine: Negative for cold intolerance, heat intolerance and polyuria.  Genitourinary: Negative for dyspareunia, dysuria, flank pain, frequency, genital sores, hematuria, menstrual problem, pelvic pain, urgency, vaginal bleeding, vaginal discharge and vaginal pain.  Musculoskeletal: Negative for back pain, joint swelling and myalgias.  Skin: Negative for rash.  Neurological: Positive for headaches. Negative for dizziness, syncope, light-headedness and numbness.  Hematological: Negative for adenopathy.  Psychiatric/Behavioral: Negative for agitation, confusion, sleep disturbance and suicidal ideas. The patient is not nervous/anxious.    BREAST: No symptoms   Objective: BP 100/66 (BP Location: Left Arm, Patient Position: Sitting, Cuff Size: Normal)   Pulse 60   Ht 5\' 4"  (1.626 m)   Wt 161 lb (73 kg)   LMP 08/14/2017 (Exact Date)   BMI 27.64 kg/m    Physical Exam  Constitutional: She is oriented to person, place, and time. She appears well-developed and well-nourished.  Genitourinary: Vagina normal and uterus normal. There is no rash or tenderness on the right labia. There is no rash or tenderness on the left labia. No erythema or tenderness in the vagina. No vaginal discharge found. Right adnexum does not display mass and does not display tenderness. Left adnexum does not display mass and does not display tenderness. Cervix does not exhibit motion tenderness or polyp. Uterus is not enlarged or tender.  Neck: Normal range of motion. No thyromegaly present.  Cardiovascular: Normal rate, regular rhythm and normal heart sounds.  No murmur heard. Pulmonary/Chest: Effort normal and breath sounds normal. Right breast exhibits no mass, no nipple discharge, no skin change and no tenderness. Left breast exhibits no mass,  no nipple discharge, no skin change and no tenderness.  Abdominal: Soft. There is no tenderness. There is no guarding.  Musculoskeletal: Normal range of motion.  Neurological: She is alert and oriented to person, place, and time. No cranial nerve deficit.  Psychiatric: She has a normal mood and affect. Her behavior is normal.  Vitals reviewed.   Assessment/Plan: Encounter for annual routine gynecological examination  Cervical cancer screening - Plan: IGP, Aptima HPV  Screening for HPV (human papillomavirus) - Plan: IGP, Aptima HPV  Encounter for surveillance of vaginal ring hormonal contraceptive device - Nuvaring RF. - Plan: etonogestrel-ethinyl estradiol (NUVARING) 0.12-0.015 MG/24HR vaginal ring  Meds ordered this encounter  Medications  . etonogestrel-ethinyl estradiol (NUVARING) 0.12-0.015 MG/24HR vaginal ring    Sig: Insert vaginally and leave in place for 3 consecutive weeks,  then remove for 1 week.    Dispense:  1 each    Refill:  11    Order Specific Question:   Supervising Provider    Answer:   Nadara MustardHARRIS, ROBERT P [[id number]]             GYN counsel adequate intake of calcium and vitamin D, diet and exercise     F/U  Return in about 1 year (around 09/02/2018).  Margaret Staggs B. Matalyn Nawaz, PA-C 09/01/2017 9:17 AM

## 2017-09-04 LAB — IGP, APTIMA HPV
HPV Aptima: NEGATIVE
PAP SMEAR COMMENT: 0

## 2017-12-17 DIAGNOSIS — G43719 Chronic migraine without aura, intractable, without status migrainosus: Secondary | ICD-10-CM | POA: Diagnosis not present

## 2017-12-17 DIAGNOSIS — G43019 Migraine without aura, intractable, without status migrainosus: Secondary | ICD-10-CM | POA: Diagnosis not present

## 2018-04-20 DIAGNOSIS — H5213 Myopia, bilateral: Secondary | ICD-10-CM | POA: Diagnosis not present

## 2018-09-28 ENCOUNTER — Ambulatory Visit: Payer: No Typology Code available for payment source

## 2018-09-28 ENCOUNTER — Ambulatory Visit: Payer: 59 | Admitting: Family Medicine

## 2018-09-28 ENCOUNTER — Ambulatory Visit (INDEPENDENT_AMBULATORY_CARE_PROVIDER_SITE_OTHER): Payer: No Typology Code available for payment source | Admitting: Family Medicine

## 2018-09-28 ENCOUNTER — Other Ambulatory Visit: Payer: Self-pay

## 2018-09-28 ENCOUNTER — Encounter: Payer: Self-pay | Admitting: Family Medicine

## 2018-09-28 VITALS — BP 102/64 | HR 64 | Temp 97.6°F | Ht 64.0 in | Wt 164.0 lb

## 2018-09-28 DIAGNOSIS — G43709 Chronic migraine without aura, not intractable, without status migrainosus: Secondary | ICD-10-CM | POA: Diagnosis not present

## 2018-09-28 DIAGNOSIS — G514 Facial myokymia: Secondary | ICD-10-CM | POA: Diagnosis not present

## 2018-09-28 NOTE — Progress Notes (Signed)
I connected with Christy Whitehead on 09/28/18 at 10:00 AM EDT by video and verified that I am speaking with the correct person using two identifiers.   I discussed the limitations, risks, security and privacy concerns of performing an evaluation and management service by video and the availability of in person appointments. I also discussed with the patient that there may be a patient responsible charge related to this service. The patient expressed understanding and agreed to proceed.  Patient location: Home Provider Location: [facility] Participants: Christy ChildJessica R Nickey Whitehead and Christy SitterJessica Marie Whitehead   Subjective:     Christy Whitehead is a 33 y.o. female presenting for Establish Care (no previous PCP but does see a GYN at [facility]); Migraine (needs referral to a [facility] neurologist for migraines. She was going to [facility] 6 years but when [facility] this doctor is not covered anymore.); and Eye Twitching (right eye twitching off and on for 2 months. No pain and no visual issues just annoying.)     HPI   #Migraines - Currently taking topamax 100 mg daily - Acute treatment: Robaxin which works for mild HA/migraine - will get bad rebound migraines which are worse on her period - has been on nuvaring for 13 years and migraines worse over 8 years - has done trigger point which worked well - has not done botox  #Twitching - over the last few months - will notice twitching under the right eye - did it non-stop for a month - stopped for 1 week, then returned for 1 week - Caffeine: not really - sleep: normally goes to sleep after work,  - works night shifts as a Charity fundraiserN, but does not really transition to days when she if off - denies dry or itchy eye - no vision changes - does have corrected vision but does not routinely wear her glasses - no other weakness or neurological changes per patient  Review of Systems See HPI   Social History   Tobacco Use  Smoking Status Former Smoker  . Packs/day: 1.00  . Years: 13.00  . Pack years: 13.00  . Last attempt to quit: 2018  . Years since quitting: 2.3  Smokeless Tobacco Never Used        Objective:   BP Readings from Last 3 Encounters:  09/28/18 102/64  09/01/17 100/66  07/02/17 118/78   Wt Readings from Last 3 Encounters:  09/28/18 164 lb (74.4 kg)  09/01/17 161 lb (73 kg)  07/02/17 158 lb (71.7 kg)   BP 102/64   Pulse 64   Temp 97.6 F (36.4 C)   Ht 5\' 4"  (1.626 m)   Wt 164 lb (74.4 kg)   SpO2 98%   BMI 28.15 kg/m    Physical Exam Constitutional:      Appearance: Normal appearance. She is not ill-appearing.  HENT:     Head: Normocephalic and atraumatic.     Right Ear: External ear normal.     Left Ear: External ear normal.     Nose: Nose normal.  Eyes:     Conjunctiva/sclera: Conjunctivae normal.     Comments: Unable to visualize twitching of eyelid  Cardiovascular:     Rate and Rhythm: Normal rate.  Pulmonary:     Effort: Pulmonary effort is normal. No respiratory distress.  Neurological:     Mental Status: She is alert. Mental status is at baseline.  Psychiatric:  Mood and Affect: Mood normal.        Thought Content: Thought content normal.        Judgment: Judgment normal.           Assessment & Plan:   Problem List Items Addressed This Visit      Cardiovascular and Mediastinum   Migraines - Primary    Has been going to the [facility] for years, but insurance changed. Interested in neurology for continued following. No aura, but did mention that she discuss birth control with OB/GYN to see if choosing a non-estrogen option may improve HA symptoms.       Relevant Medications   topiramate (TOPAMAX) 100 MG tablet   Other Relevant Orders   Ambulatory referral to Neurology    Other Visit Diagnoses    Facial twitching         Suspect eye twitching is benign and related to stress or not wearing glasses.  Recommended wearing glasses, if persisting could consider further work-up   Return in about 1 year (around 09/28/2019).  Christy Child, MD

## 2018-09-28 NOTE — Assessment & Plan Note (Signed)
Has been going to the [facility] center for years, but insurance changed. Interested in neurology for continued following. No aura, but did mention that she discuss birth control with OB/GYN to see if choosing a non-estrogen option may improve HA symptoms.

## 2018-10-06 ENCOUNTER — Telehealth: Payer: Self-pay | Admitting: *Deleted

## 2018-10-06 ENCOUNTER — Encounter: Payer: Self-pay | Admitting: *Deleted

## 2018-10-06 NOTE — Telephone Encounter (Signed)
LVM requesting call back to update EMR.  

## 2018-10-06 NOTE — Telephone Encounter (Signed)
Spoke with patient and updated EMR. 

## 2018-10-07 ENCOUNTER — Encounter: Payer: Self-pay | Admitting: Diagnostic Neuroimaging

## 2018-10-07 ENCOUNTER — Ambulatory Visit (INDEPENDENT_AMBULATORY_CARE_PROVIDER_SITE_OTHER): Payer: No Typology Code available for payment source | Admitting: Diagnostic Neuroimaging

## 2018-10-07 ENCOUNTER — Other Ambulatory Visit: Payer: Self-pay

## 2018-10-07 ENCOUNTER — Telehealth: Payer: Self-pay | Admitting: *Deleted

## 2018-10-07 DIAGNOSIS — G43009 Migraine without aura, not intractable, without status migrainosus: Secondary | ICD-10-CM

## 2018-10-07 DIAGNOSIS — G44209 Tension-type headache, unspecified, not intractable: Secondary | ICD-10-CM

## 2018-10-07 MED ORDER — ERENUMAB-AOOE 70 MG/ML ~~LOC~~ SOAJ: 70.0000 mg | SUBCUTANEOUS | 4 refills | Status: DC

## 2018-10-07 MED ORDER — RIZATRIPTAN BENZOATE 10 MG PO TBDP: 10.0000 mg | ORAL_TABLET | ORAL | 11 refills | Status: DC | PRN

## 2018-10-07 MED ORDER — TOPIRAMATE 100 MG PO TABS
100.0000 mg | ORAL_TABLET | Freq: Every day | ORAL | 4 refills | Status: DC
Start: 2018-10-07 — End: 2019-05-10

## 2018-10-07 MED ORDER — ONDANSETRON 4 MG PO TBDP: 4.0000 mg | ORAL_TABLET | Freq: Three times a day (TID) | ORAL | 3 refills | Status: DC | PRN

## 2018-10-07 NOTE — Telephone Encounter (Signed)
Received fax from [facility] pharmacy: [employer]ig requires PA. Started PA on CMM, key: [id number]. Dx: G 43.009, G44.209, failed: topiramate, tofranil, imipramine. Clinical questions answered, submitted to [employer].

## 2018-10-07 NOTE — Telephone Encounter (Signed)
Received fax from [employer]: Aimovig approved 10/07/18 till 04/08/2019, 1 mL per 30 days. PA# [id number]. PA request form pharmacy faxed back with approval information.

## 2018-10-07 NOTE — Progress Notes (Signed)
[facility]  PATIENT: Berenis Cenac DOB: [birth year]  REFERRING CLINICIAN: Selena Batten, J HISTORY FROM: patient  REASON FOR VISIT: new consult    HISTORICAL  CHIEF COMPLAINT:  No chief complaint on file.   HISTORY OF PRESENT ILLNESS:   33 year old female here for evaluation migraine headaches.  Patient has had migraine headaches since teenage years.  She describes frontal, sometimes unilateral, throbbing severe headaches associated with phonophobia and nausea.  No visual aura.  Patient also has some lower back pain daily headaches.  Patient has previously tried topiramate, Tofranil and imipramine.  She has tried trigger point injections with mild relief.  She has tried sumatriptan and Fioricet with mild relief.  She is also used Tylenol, ibuprofen and Robaxin.  Patient has approximately 1 week of severe headaches per month in addition to daily headaches.  No specific triggering factors.  Patient has tried to improve her nutrition, sleep and caffeine intake.  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    REVIEW OF SYSTEMS: Full 14 system review of systems performed and negative with exception of: As per HPI.  ALLERGIES: No Known Allergies  HOME MEDICATIONS: Outpatient Medications Prior to Visit  Medication Sig Dispense Refill  . etonogestrel-ethinyl estradiol (NUVARING) 0.12-0.015 MG/24HR vaginal ring Insert vaginally and leave in place for 3 consecutive weeks, then remove for 1 week. 1 each 11  . methocarbamol (ROBAXIN) 500 MG tablet   1  . topiramate (TOPAMAX) 100 MG tablet      No facility-administered medications prior to visit.     PAST MEDICAL HISTORY: Past Medical History:  Diagnosis Date  . Abnormal Pap smear of cervix 12/19/2008, 02/20/2009   ASCUS  . Abnormal Pap smear of cervix 08/06/2010, 10/01/2010   HGSIL  . Cervical dysplasia CIN1 06/05/2004  . CIN III (cervical intraepithelial neoplasia grade III) with severe  dysplasia 12/20/2010   LEEP Ecto Cervix  . Dysmenorrhea   . Migraines     PAST SURGICAL HISTORY: Past Surgical History:  Procedure Laterality Date  . CHOLECYSTECTOMY  2015  . COLPOSCOPY  06/05/2004,02/20/2009,10/01/2010  . LEEP  12/20/2010   ECC &Endo Cx Negative, Ecto CIN 2-3    FAMILY HISTORY: Family History  Problem Relation Age of Onset  . COPD Mother   . Diabetes Mother   . Diabetes Father   . Hypertension Father   . Bipolar disorder Father   . Breast cancer Paternal Grandmother 19  . Diabetes Sister   . Obesity Sister        morbid  . Mental retardation Sister   . Healthy Daughter   . Suicidality Paternal Grandfather     SOCIAL HISTORY: Social History   Socioeconomic History  . Marital status: Single    Spouse name: Not on file  . Number of children: 1  . Years of education: College  . 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 Financial resource strain: Not hard at all  . Food insecurity:    Worry: Not on file    Inability: Not on file  . Transportation needs:    Medical: Not on file    Non-medical: Not on file  Tobacco Use  . Smoking status: Former Smoker    Packs/day: 1.00    Years: 13.00    Pack years: 13.00    Last attempt to quit: 2018    Years since quitting: 2.3  .  Smokeless tobacco: Never Used  Substance and Sexual Activity  . Alcohol use: Yes    Comment: once every 2 weeks, occasionally >4  . Drug use: No  . Sexual activity: Not Currently    Birth control/protection: Inserts    Comment: nuvaring  Lifestyle  . Physical activity:    Days per week: Not on file    Minutes per session: Not on file  . Stress: Not on file  Relationships  . Social connections:    Talks on phone: Not on file    Gets together: Not on file    Attends religious service: Not on file    Active member of club or organization: Not on file    Attends meetings of clubs or  organizations: Not on file    Relationship status: Not on file  . Intimate partner violence:    Fear of current or ex partner: Not on file    Emotionally abused: Not on file    Physically abused: Not on file    Forced sexual activity: Not on file  Other Topics Concern  . 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  There is no height or weight on file to calculate BMI. Wt Readings from Last 3 Encounters:  09/28/18 164 lb (74.4 kg)  09/01/17 161 lb (73 kg)  07/02/17 158 lb (71.7 kg)     Patient is in no distress; well developed, nourished and groomed; neck is supple   NEUROLOGIC: MENTAL STATUS:  No flowsheet data found.  awake, alert, oriented to person, place and time  recent and remote memory intact  normal attention and concentration  language fluent, comprehension intact, naming intact  fund of knowledge appropriate  CRANIAL NERVE:   2nd, 3rd, 4th, 6th - visual fields full to confrontation, extraocular muscles intact, no nystagmus  5th - facial sensation symmetric  7th - facial strength symmetric  8th - hearing intact  11th - shoulder shrug symmetric  12th - tongue protrusion midline  MOTOR:   NO TREMOR; NO DRIFT IN BUE  SENSORY:   normal and symmetric to light touch  COORDINATION:   fine finger movements normal       DIAGNOSTIC DATA (LABS, IMAGING, TESTING) - I reviewed patient records, labs, notes, testing and imaging myself where available.  No results found for: WBC, HGB, HCT, MCV, PLT No results found for: NA, K, CL, CO2,  GLUCOSE, BUN, CREATININE, CALCIUM, PROT, ALBUMIN, AST, ALT, ALKPHOS, BILITOT, GFRNONAA, GFRAA No results found for: CHOL, HDL, LDLCALC, LDLDIRECT, TRIG, CHOLHDL No results found for: GNFA2ZHGBA1C No results found for: VITAMINB12 No results found for: TSH     ASSESSMENT AND PLAN  33 y.o. year old female here with migraine headaches without aura and tension headaches.   Dx:  1. Migraine without aura and without status migrainosus, not intractable   2. Tension headache    Virtual Visit via Video Note  I connected with Dia SitterJessica Marie Niebla on 10/07/18 at  9:00 AM EDT by a  video enabled telemedicine application and verified that I am speaking with the correct person using two identifiers.  Location: Patient: home  Provider: office   I discussed the limitations of evaluation and management by telemedicine and the availability of in person appointments. The patient expressed understanding and agreed to proceed.  I discussed the assessment and treatment plan with the patient. The patient was provided an opportunity to ask questions and all were answered. The patient agreed with the plan and demonstrated an understanding of the instructions.   The patient was advised to call back or seek an in-person evaluation if the symptoms worsen or if the condition fails to improve as anticipated.  I provided 25 minutes of non-face-to-face time during this encounter.    PLAN:  - MIGRAINE PREVENTION --> continue topiramate  daily; drink plenty of water; add on aimovig injections  - MIGRAINE RESCUE --> rizatriptan  as needed for breakthrough headache; may repeat x 1 after 2 hours; max 2 tabs per day or 8 per month; may also use ibuprofen or tylenol  - may consider MRI brain if HA worsen or change in future  Meds ordered this encounter  Medications  . Erenumab-aooe (AIMOVIG) 70 MG/ML SOAJ    Sig: Inject 70 mg into the skin every 30 (thirty) days.    Dispense:  3 pen    Refill:  4  .  rizatriptan (MAXALT-MLT) 10 MG disintegrating tablet    Sig: Take 1 tablet (10 mg total) by mouth as needed for migraine. May repeat in 2 hours if needed    Dispense:  9 tablet    Refill:  11  . topiramate (TOPAMAX) 100 MG tablet    Sig: Take 1 tablet (100 mg total) by mouth daily.    Dispense:  90 tablet    Refill:  4  . ondansetron (ZOFRAN-ODT) 4 MG disintegrating tablet    Sig: Take 1 tablet (4 mg total) by mouth every 8 (eight) hours as needed for nausea or vomiting.    Dispense:  10 tablet    Refill:  3   Return in about 4 months (around 02/07/2019).    Suanne Marker, MD 10/07/2018, 9:02 AM Certified in Neurology, Neurophysiology and Neuroimaging  [facility] [address], [address] [place], [place] [address] [phone]

## 2018-12-17 ENCOUNTER — Other Ambulatory Visit: Payer: Self-pay | Admitting: Obstetrics and Gynecology

## 2018-12-17 DIAGNOSIS — Z3044 Encounter for surveillance of vaginal ring hormonal contraceptive device: Secondary | ICD-10-CM

## 2018-12-21 ENCOUNTER — Other Ambulatory Visit: Payer: Self-pay

## 2018-12-21 DIAGNOSIS — Z3044 Encounter for surveillance of vaginal ring hormonal contraceptive device: Secondary | ICD-10-CM

## 2018-12-21 MED ORDER — ETONOGESTREL-ETHINYL ESTRADIOL 0.12-0.015 MG/24HR VA RING: VAGINAL_RING | VAGINAL | 1 refills | Status: DC

## 2018-12-21 NOTE — Telephone Encounter (Signed)
Pt calling for refill of bc; has appt sched 02/03/19.  [phone]  Left detailed msg that rx has been sent to pharm and it was received.

## 2019-02-02 DIAGNOSIS — Z87410 Personal history of cervical dysplasia: Secondary | ICD-10-CM | POA: Insufficient documentation

## 2019-02-02 NOTE — Progress Notes (Signed)
PCP:  Lesleigh Noe, MD   Chief Complaint  Patient presents with  . Gynecologic Exam     HPI:      [name] is a 33 y.o. G1P1001 who LMP was Patient's last menstrual period was 01/18/2019 (exact date)., presents today for her annual examination.  Her menses are Q5 wks with nuvaring, lasting 5 days.  Dysmenorrhea none. She does not have intermenstrual bleeding. Hx of migraine without aura for many yrs. On topamax and just started aimovig with significant HA improvement. Only has headaches now with menses. Sx start a few days of 4th wk of nuvaring and last all ring-free wk. Pt wondered about changing to different BC that would stop cycles. Pt not interested in depo due to potential wt gain SE.  Sex activity: not sex active- NuvaRing vaginal inserts.  Last Pap: 09/01/17  Results were: no abnormalities /neg HPV DNA  Hx of STDs: HPV--hx of CIN3 with LEEP 2012  There is a FH of breast cancer in her PGM, genetic testing not indicated. There is no FH of ovarian cancer. The patient does not do self-breast exams.  Tobacco use: The patient denies current or previous tobacco use. Alcohol use: social drinker No drug use.  Exercise: moderately active  She does get adequate calcium but not Vitamin D in her diet.   Past Medical History:  Diagnosis Date  . Abnormal Pap smear of cervix 12/19/2008, 02/20/2009   ASCUS  . Abnormal Pap smear of cervix 08/06/2010, 10/01/2010   HGSIL  . Cervical dysplasia CIN1 06/05/2004  . CIN III (cervical intraepithelial neoplasia grade III) with severe dysplasia 12/20/2010   LEEP Ecto Cervix  . Dysmenorrhea   . Migraines     Past Surgical History:  Procedure Laterality Date  . CHOLECYSTECTOMY  2015  . COLPOSCOPY  06/05/2004,02/20/2009,10/01/2010  . LEEP  12/20/2010   ECC &Endo Cx Negative, Ecto CIN 2-3    Family History  Problem Relation Age of Onset  . COPD Mother   . Diabetes Mother   . Diabetes Father   . Hypertension Father   .  Bipolar disorder Father   . Breast cancer Paternal Grandmother 47  . Diabetes Sister   . Obesity Sister        morbid  . Mental retardation Sister   . Healthy Daughter   . Suicidality Paternal Grandfather     Social History   Socioeconomic History  . Marital status: Single    Spouse name: Not on file  . Number of children: 1  . Years of education: College  . 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 Financial resource strain: Not hard at all  . Food insecurity    Worry: Not on file    Inability: Not on file  . Transportation needs    Medical: Not on file    Non-medical: Not on file  Tobacco Use  . Smoking status: Former Smoker    Packs/day: 1.00    Years: 13.00    Pack years: 13.00    Quit date: 2018    Years since quitting: 2.6  . Smokeless tobacco: Never Used  Substance and Sexual Activity  . Alcohol use: Yes    Comment: once every 2 weeks, occasionally >4  . Drug use: No  . Sexual activity: Not Currently    Birth control/protection: Inserts    Comment: nuvaring  Lifestyle  .  Physical activity    Days per week: Not on file    Minutes per session: Not on file  . Stress: Not on file  Relationships  . Social Musician on phone: Not on file    Gets together: Not on file    Attends religious service: Not on file    Active member of club or organization: Not on file    Attends meetings of clubs or organizations: Not on file    Relationship status: Not on file  . Intimate partner violence    Fear of current or ex partner: Not on file    Emotionally abused: Not on file    Physically abused: Not on file    Forced sexual activity: Not on file  Other Topics Concern  . 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 Erenumab-aooe (AIMOVIG) 70 MG/ML SOAJ Inject 70 mg into the skin every 30 (thirty) days. 3 pen 4  . methocarbamol (ROBAXIN) 500 MG tablet   1  . ondansetron (ZOFRAN-ODT) 4 MG disintegrating tablet Take 1 tablet (4 mg total) by mouth every 8 (eight) hours as needed for nausea or vomiting. 10 tablet 3  . rizatriptan (MAXALT-MLT) 10 MG disintegrating tablet Take 1 tablet (10 mg total) by mouth as needed for migraine. May repeat in 2 hours if needed 9 tablet 11  . topiramate (TOPAMAX) 100 MG tablet Take 1 tablet (100 mg total) by mouth daily. 90 tablet 4  . etonogestrel-ethinyl estradiol (NUVARING) 0.12-0.015 MG/24HR vaginal ring Insert vaginally and leave in place for 3 consecutive weeks, then remove for 1 week. 1 each 1   No facility-administered medications prior to visit.       ROS:  Review of Systems  Constitutional: Negative for fatigue, fever and unexpected weight change.  Respiratory: Negative for cough, shortness of breath and wheezing.   Cardiovascular: Negative for chest pain, palpitations and leg swelling.  Gastrointestinal: Negative for blood in stool, constipation, diarrhea, nausea and vomiting.  Endocrine: Negative for cold intolerance, heat intolerance and polyuria.  Genitourinary: Negative for dyspareunia, dysuria, flank pain, frequency, genital sores, hematuria, menstrual problem, pelvic pain, urgency, vaginal bleeding, vaginal discharge and vaginal pain.  Musculoskeletal: Negative for back pain, joint swelling and myalgias.  Skin: Negative for rash.  Neurological: Positive for headaches. Negative for dizziness, syncope, light-headedness and numbness.  Hematological: Negative for adenopathy.  Psychiatric/Behavioral: Negative for agitation, confusion, sleep  disturbance and suicidal ideas. The patient is not nervous/anxious.    BREAST: No symptoms   Objective: BP 90/60   Ht 5\' 4"  (1.626 m)   Wt 161 lb (73 kg)   LMP 01/18/2019 (Exact Date)   BMI 27.64 kg/m    Physical Exam Constitutional:      Appearance: She is well-developed.  Genitourinary:     Vulva, vagina, uterus, right adnexa and left adnexa normal.     No vulval lesion or tenderness noted.     No vaginal discharge, erythema or tenderness.     No cervical motion tenderness or polyp.  Uterus is not enlarged or tender.     No right or left adnexal mass present.     Right adnexa not tender.     Left adnexa not tender.  Neck:     Musculoskeletal: Normal range of motion.     Thyroid: No thyromegaly.  Cardiovascular:     Rate and Rhythm: Normal rate and regular rhythm.     Heart sounds: Normal heart sounds. No murmur.  Pulmonary:     Effort: Pulmonary effort is normal.     Breath sounds: Normal breath sounds.  Chest:     Breasts:        Right: No mass, nipple discharge, skin change or tenderness.        Left: No mass, nipple discharge, skin change or tenderness.  Abdominal:     Palpations: Abdomen is soft.     Tenderness: There is no abdominal tenderness. There is no guarding.  Musculoskeletal: Normal range of motion.  Neurological:     General: No focal deficit present.     Mental Status: She is alert and oriented to person, place, and time.     Cranial Nerves: No cranial nerve deficit.  Skin:    General: Skin is warm and dry.  Psychiatric:        Mood and Affect: Mood normal.        Behavior: Behavior normal.        Thought Content: Thought content normal.        Judgment: Judgment normal.  Vitals signs reviewed.     Assessment/Plan: Encounter for annual routine gynecological examination  Cervical cancer screening - Plan: Cytology - PAP  History of cervical dysplasia - Plan: Cytology - PAP  Encounter for surveillance of vaginal ring hormonal  contraceptive device - Plan: etonogestrel-ethinyl estradiol (NUVARING) 0.12-0.015 MG/24HR vaginal ring; Rx RF for now. Wear ring for 3 wks only and replace for cont dosing for headaches.   Intractable menstrual migraine without status migrainosus--Discussed depo and nexplanon to stop cycles to see if helps HAs, but pt not interested in depo. Discussed irreg bleeding with nexplanon. Discussed trying camila POPs to see if estrogen-free makes headache difference. Also discussed trying cont dosing of nuvaring and replacing after 3rd wk instead of 4th wk due to slight hormone decrease 3rd wk of nuvaring. Will try this first. If still sx, will try camila (don't want placebo pills of slynd).  Meds ordered this encounter  Medications  . etonogestrel-ethinyl estradiol (NUVARING) 0.12-0.015 MG/24HR vaginal ring    Sig: Insert vaginally and leave in place for 3 consecutive weeks, then replace for CONTINUOUS DOSING due to headaches    Dispense:  3 each    Refill:  0    Order Specific Question:   Supervising Provider    Answer:   Nadara MustardHARRIS, ROBERT P [[id number]]             GYN counsel adequate intake of calcium and vitamin D, diet and exercise     F/U  Return in about 1 year (around 02/03/2020).  Amiri Tritch B. Maritssa Haughton, PA-C 02/03/2019 9:37 AM

## 2019-02-03 ENCOUNTER — Ambulatory Visit (INDEPENDENT_AMBULATORY_CARE_PROVIDER_SITE_OTHER): Payer: No Typology Code available for payment source | Admitting: Obstetrics and Gynecology

## 2019-02-03 ENCOUNTER — Other Ambulatory Visit: Payer: Self-pay

## 2019-02-03 ENCOUNTER — Other Ambulatory Visit (HOSPITAL_COMMUNITY)
Admission: RE | Admit: 2019-02-03 | Discharge: 2019-02-03 | Disposition: A | Discharge status: Home / Self Care | Payer: No Typology Code available for payment source | Source: Ambulatory Visit | Attending: Obstetrics and Gynecology | Admitting: Obstetrics and Gynecology

## 2019-02-03 ENCOUNTER — Encounter: Payer: Self-pay | Admitting: Obstetrics and Gynecology

## 2019-02-03 VITALS — BP 90/60 | Ht 64.0 in | Wt 161.0 lb

## 2019-02-03 DIAGNOSIS — Z124 Encounter for screening for malignant neoplasm of cervix: Secondary | ICD-10-CM | POA: Insufficient documentation

## 2019-02-03 DIAGNOSIS — Z01419 Encounter for gynecological examination (general) (routine) without abnormal findings: Secondary | ICD-10-CM

## 2019-02-03 DIAGNOSIS — Z87410 Personal history of cervical dysplasia: Secondary | ICD-10-CM | POA: Diagnosis present

## 2019-02-03 DIAGNOSIS — Z3044 Encounter for surveillance of vaginal ring hormonal contraceptive device: Secondary | ICD-10-CM

## 2019-02-03 DIAGNOSIS — G43839 Menstrual migraine, intractable, without status migrainosus: Secondary | ICD-10-CM

## 2019-02-03 MED ORDER — ETONOGESTREL-ETHINYL ESTRADIOL 0.12-0.015 MG/24HR VA RING: VAGINAL_RING | VAGINAL | 0 refills | Status: DC

## 2019-02-03 NOTE — Patient Instructions (Signed)
I value your feedback and entrusting us with your care. If you get a [facility] patient survey, I would appreciate you taking the time to let us know about your experience today. Thank you! 

## 2019-02-04 LAB — CYTOLOGY - PAP: Diagnosis: NEGATIVE

## 2019-03-31 ENCOUNTER — Telehealth: Payer: Self-pay | Admitting: *Deleted

## 2019-03-31 NOTE — Telephone Encounter (Signed)
LVM requesting call back. She needs a follow up scheduled. Advised her that her insurance is requiring another PA on Aimovig. I need to know if she has had a reduction in migraine since starting Aimovig. Left office number.

## 2019-04-06 ENCOUNTER — Encounter: Payer: Self-pay | Admitting: *Deleted

## 2019-04-06 NOTE — Telephone Encounter (Signed)
Received call form Tess/ CMM asking if there was issue with Aimovig PA. I advised her I had LVM for patient because I need to know if she's had reduction in migraines since starting AImovig.Patient hasn't returned my call. Tess  verbalized understanding, appreciation. Sent my chart message asking patient about migraines and to advise she needs follow up.

## 2019-04-07 ENCOUNTER — Other Ambulatory Visit: Payer: Self-pay | Admitting: Obstetrics and Gynecology

## 2019-04-07 ENCOUNTER — Encounter: Payer: Self-pay | Admitting: Obstetrics and Gynecology

## 2019-04-07 MED ORDER — NORETHINDRONE 0.35 MG PO TABS: 1.0000 | ORAL_TABLET | Freq: Every day | ORAL | 2 refills | Status: DC

## 2019-04-07 NOTE — Telephone Encounter (Addendum)
Received from patient via my chart: I have had a great reduction in my migraines since starting the Amovig. PA started on [facility], key:  [id number].  Dx: G23.009 [employer] is reviewing your PA request.  To check for an update later, open this request again from your dashboard. If [employer] has not replied within 24 hours for urgent requests or within 48 hours for standard requests, please contact [employer] at [phone].

## 2019-04-07 NOTE — Progress Notes (Signed)
BC change to POPs due to menstrual migraines. Not improved with nuvaring.

## 2019-04-11 ENCOUNTER — Encounter: Payer: Self-pay | Admitting: *Deleted

## 2019-04-11 NOTE — Telephone Encounter (Addendum)
Received fax from [employer]: Aimovig 70 mg approved for max 12 refills 04/09/2019 to 04/07/2020. Notified patient via my chart and faxed approval letter to [place], f [phone].

## 2019-05-10 ENCOUNTER — Other Ambulatory Visit: Payer: Self-pay

## 2019-05-10 ENCOUNTER — Ambulatory Visit: Payer: No Typology Code available for payment source | Admitting: Diagnostic Neuroimaging

## 2019-05-10 ENCOUNTER — Telehealth: Payer: Self-pay | Admitting: *Deleted

## 2019-05-10 ENCOUNTER — Encounter: Payer: Self-pay | Admitting: *Deleted

## 2019-05-10 ENCOUNTER — Encounter: Payer: Self-pay | Admitting: Diagnostic Neuroimaging

## 2019-05-10 VITALS — BP 102/65 | HR 61 | Temp 97.5°F | Ht 64.0 in | Wt 160.2 lb

## 2019-05-10 DIAGNOSIS — G43009 Migraine without aura, not intractable, without status migrainosus: Secondary | ICD-10-CM | POA: Diagnosis not present

## 2019-05-10 DIAGNOSIS — G44209 Tension-type headache, unspecified, not intractable: Secondary | ICD-10-CM | POA: Diagnosis not present

## 2019-05-10 DIAGNOSIS — R519 Headache, unspecified: Secondary | ICD-10-CM | POA: Diagnosis not present

## 2019-05-10 MED ORDER — EMGALITY 120 MG/ML ~~LOC~~ SOAJ: 120.0000 mg | SUBCUTANEOUS | 4 refills | Status: DC

## 2019-05-10 MED ORDER — TOPIRAMATE 100 MG PO TABS: 100.0000 mg | ORAL_TABLET | Freq: Two times a day (BID) | ORAL | 4 refills | Status: DC

## 2019-05-10 MED ORDER — ALPRAZOLAM 0.5 MG PO TABS: ORAL_TABLET | ORAL | 0 refills | Status: DC

## 2019-05-10 NOTE — Telephone Encounter (Signed)
Started Wing PA on [facility], key: [id number]. Dx: G43.009, G44.209 Patient has previously tried topiramate, Tofranil and imipramine.  She has tried trigger point injections with mild relief.  She has tried sumatriptan and Fioricet with mild relief. Has failed Aimovig.  [employer] is reviewing your PA request. You may close this dialog, return to your dashboard, and perform other tasks.  To check for an update later, open this request again from your dashboard. If [employer] has not replied within 24 hours for urgent requests or within 48 hours for standard requests, please contact [employer] at [phone]

## 2019-05-10 NOTE — Progress Notes (Signed)
[facility]  PATIENT: Christy Whitehead DOB: 1986-04-10  REFERRING CLINICIAN: Selena Batten, J HISTORY FROM: patient  REASON FOR VISIT: follow up    HISTORICAL  CHIEF COMPLAINT:  Chief Complaint  Patient presents with  . Migraine    rm 7,  FU "Migraines got much better for a while; over past month or so my headaches are getting worse again; switched birth control 1 month ago; now with daily headaches- not so much migraines; OTC usually relieves it, but taking OTC daily"    HISTORY OF PRESENT ILLNESS:   UPDATE (05/10/19, VRP): Since last visit, was doing well for a few months, with aimovig, then in last 2 months slightly worse. Now with daily tension headaches. Also 1-2 severe migraine per month. Symptoms are increased. Stress, N95 masks, may be aggravating factors. Tried adjusting birth control hormones with ob/gyn, but did not help. Some injection site reaction with aimovig. TPX and rizatriptan working.    PRIOR HPI: 33 year old female here for evaluation migraine headaches.  Patient has had migraine headaches since teenage years.  She describes frontal, sometimes unilateral, throbbing severe headaches associated with phonophobia and nausea.  No visual aura.  Patient also has some lower back pain daily headaches.  Patient has previously tried topiramate, Tofranil and imipramine.  She has tried trigger point injections with mild relief.  She has tried sumatriptan and Fioricet with mild relief.  She is also used Tylenol, ibuprofen and Robaxin.  Patient has approximately 1 week of severe headaches per month in addition to daily headaches.  No specific triggering factors.  Patient has tried to improve her nutrition, sleep and caffeine intake.  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    REVIEW OF SYSTEMS: Full 14 system review of systems performed and negative with exception of: as per HPI.   ALLERGIES: No Known Allergies  HOME  MEDICATIONS: Outpatient Medications Prior to Visit  Medication Sig Dispense Refill  . acetaminophen (TYLENOL) 500 MG tablet Take 500 mg by mouth every 6 (six) hours as needed.    . Aspirin-Acetaminophen-Caffeine (EXCEDRIN PO) Take by mouth as needed.    Dorise Hiss (AIMOVIG) 70 MG/ML SOAJ Inject 70 mg into the skin every 30 (thirty) days. 3 pen 4  . methocarbamol (ROBAXIN) 500 MG tablet   1  . norethindrone (MICRONOR) 0.35 MG tablet Take 1 tablet (0.35 mg total) by mouth daily. 3 Package 2  . ondansetron (ZOFRAN-ODT) 4 MG disintegrating tablet Take 1 tablet (4 mg total) by mouth every 8 (eight) hours as needed for nausea or vomiting. 10 tablet 3  . rizatriptan (MAXALT-MLT) 10 MG disintegrating tablet Take 1 tablet (10 mg total) by mouth as needed for migraine. May repeat in 2 hours if needed 9 tablet 11  . topiramate (TOPAMAX) 100 MG tablet Take 1 tablet (100 mg total) by mouth daily. 90 tablet 4   No facility-administered medications prior to visit.    PAST MEDICAL HISTORY: Past Medical History:  Diagnosis Date  . Abnormal Pap smear of cervix 12/19/2008, 02/20/2009   ASCUS  . Abnormal Pap smear of cervix 08/06/2010, 10/01/2010   HGSIL  . Cervical dysplasia CIN1 06/05/2004  . CIN III (cervical intraepithelial neoplasia grade III) with severe dysplasia 12/20/2010   LEEP Ecto Cervix  . Dysmenorrhea   . Migraines     PAST SURGICAL HISTORY: Past Surgical History:  Procedure Laterality Date  . CHOLECYSTECTOMY  2015  . COLPOSCOPY  06/05/2004,02/20/2009,10/01/2010  . LEEP  12/20/2010   ECC &  Endo Cx Negative, Ecto CIN 2-3    FAMILY HISTORY: Family History  Problem Relation Age of Onset  . COPD Mother   . Diabetes Mother   . Diabetes Father   . Hypertension Father   . Bipolar disorder Father   . Breast cancer Paternal Grandmother [id number]  . Diabetes Sister   . Obesity Sister        morbid  . Mental retardation Sister   . Healthy Daughter   . Suicidality Paternal Grandfather      SOCIAL HISTORY: Social History   Socioeconomic History  . Marital status: Single    Spouse name: Not on file  . Number of children: 1  . Years of education: College  . Highest education level: Bachelor's degree (e.g., BA, AB, BS)  Occupational History    Comment: RN in ED  Tobacco Use  . Smoking status: Former Smoker    Packs/day: 1.00    Years: 13.00    Pack years: 13.00    Quit date: 2018    Years since quitting: 2.9  . Smokeless tobacco: Never Used  Substance and Sexual Activity  . Alcohol use: Yes    Comment: once every 2 weeks, occasionally >4  . Drug use: No  . Sexual activity: Not Currently    Birth control/protection: Inserts    Comment: nuvaring  Other Topics Concern  . 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 Difficulty of Paying Living Expenses: Not hard at all  Food Insecurity:   . Worried About Programme researcher, broadcasting/film/videounning Out of Food in the Last Year: Not on file  . Ran Out of Food in the Last Year: Not on file  Transportation Needs:   . Lack of Transportation (Medical): Not on file  . Lack of Transportation (Non-Medical): Not on file  Physical Activity:   . Days of Exercise per Week: Not on file  . Minutes of Exercise per Session: Not on file  Stress:   . Feeling of Stress : Not on file  Social Connections:   . Frequency of Communication with Friends and Family: Not on file  . Frequency of Social Gatherings with Friends and Family: Not on file  . Attends Religious Services: Not on file  . Active Member of Clubs or Organizations: Not on file  . Attends BankerClub or Organization Meetings: Not on file  .  Marital Status: Not on file  Intimate Partner Violence:   . Fear of Current or Ex-Partner: Not on file  . Emotionally Abused: Not on file  . Physically Abused: Not on file  . Sexually Abused: Not on file     PHYSICAL EXAM  GENERAL EXAM/CONSTITUTIONAL: Vitals:  Vitals:   05/10/19 0811  BP: 102/65  Pulse: 61  Temp: (!) 97.5 F (36.4 C)  Weight: 160 lb 3.2 oz (72.7 kg)  Height: 5\' 4"  (1.626 m)     Body mass index is 27.5 kg/m. Wt Readings from Last 3 Encounters:  05/10/19 160 lb 3.2 oz (72.7 kg)  02/03/19 161 lb (73 kg)  09/28/18 164  lb (74.4 kg)     Patient is in no distress; well developed, nourished and groomed; neck is supple  CARDIOVASCULAR:  Examination of carotid arteries is normal; no carotid bruits  Regular rate and rhythm, no murmurs  Examination of peripheral vascular system by observation and palpation is normal  EYES:  Ophthalmoscopic exam of optic discs and posterior segments is normal; no papilledema or hemorrhages  No exam data present  MUSCULOSKELETAL:  Gait, strength, tone, movements noted in Neurologic exam below  NEUROLOGIC: MENTAL STATUS:  No flowsheet data found.  awake, alert, oriented to person, place and time  recent and remote memory intact  normal attention and concentration  language fluent, comprehension intact, naming intact  fund of knowledge appropriate  CRANIAL NERVE:   2nd - no papilledema on fundoscopic exam  2nd, 3rd, 4th, 6th - pupils equal and reactive to light, visual fields full to confrontation, extraocular muscles intact, no nystagmus  5th - facial sensation symmetric  7th - facial strength symmetric  8th - hearing intact  9th - palate elevates symmetrically, uvula midline  11th - shoulder shrug symmetric  12th - tongue protrusion midline  MOTOR:   normal bulk and tone, full strength in the BUE, BLE  SENSORY:   normal and symmetric to light touch, pinprick, temperature,  vibration  COORDINATION:   finger-nose-finger, fine finger movements normal  REFLEXES:   deep tendon reflexes present and symmetric  GAIT/STATION:   narrow based gait; able to walk on toes, heels and tandem; romberg is negative    DIAGNOSTIC DATA (LABS, IMAGING, TESTING) - I reviewed patient records, labs, notes, testing and imaging myself where available.  No results found for: WBC, HGB, HCT, MCV, PLT No results found for: NA, K, CL, CO2, GLUCOSE, BUN, CREATININE, CALCIUM, PROT, ALBUMIN, AST, ALT, ALKPHOS, BILITOT, GFRNONAA, GFRAA No results found for: CHOL, HDL, LDLCALC, LDLDIRECT, TRIG, CHOLHDL No results found for: HGBA1C No results found for: VITAMINB12 No results found for: TSH     ASSESSMENT AND PLAN  33 y.o. year old female here with migraine headaches without aura and tension headaches.   Dx:  1. New onset headache   2. Migraine without aura and without status migrainosus, not intractable   3. Tension headache     PLAN:  MIGRAINE WITHOUT AURA + TENSION HEADACHES  - MIGRAINE PREVENTION  --> increase topiramate to 100mg  twice a day; drink plenty of water --> change aimovig to emgality --> may consider botox in future  - MIGRAINE RESCUE  --> rizatriptan 10mg  as needed for breakthrough headache; may repeat x 1 after 2 hours; max 2 tabs per day or 8 per month; may also use ibuprofen or tylenol  NEW / WORSENING HEADACHES - check MRI brain due to worsening headaches; not responding to medications  Orders Placed This Encounter  Procedures  . MR BRAIN W WO CONTRAST   Meds ordered this encounter  Medications  . topiramate (TOPAMAX) 100 MG tablet    Sig: Take 1 tablet (100 mg total) by mouth 2 (two) times daily.    Dispense:  180 tablet    Refill:  4  . Galcanezumab-gnlm (EMGALITY) 120 MG/ML SOAJ    Sig: Inject 120 mg into the skin every 30 (thirty) days.    Dispense:  3 pen    Refill:  4   Return in about 4 months (around 09/08/2019) for with NP  (Amy Lomax).    Penni Bombard, MD 64/33/2951, 8:84 AM Certified in Neurology, Neurophysiology and Neuroimaging  [facility] [address] [place], [place] [address] [phone]

## 2019-05-10 NOTE — Patient Instructions (Signed)
MIGRAINE WITHOUT AURA + TENSION HEADACHES  - MIGRAINE PREVENTION  --> increase topiramate to 100mg  twice a day; drink plenty of water --> change aimovig to emgality  - MIGRAINE RESCUE  --> rizatriptan 10mg  as needed for breakthrough headache; may repeat x 1 after 2 hours; max 2 tabs per day or 8 per month; may also use ibuprofen or tylenol  NEW / WORSENING HEADACHES - check MRI brain due to worsening headaches; not responding to medications

## 2019-05-10 NOTE — Addendum Note (Signed)
Addended by: Andrey Spearman R on: 05/10/2019 09:14 AM   Modules accepted: Orders

## 2019-05-11 ENCOUNTER — Encounter: Payer: Self-pay | Admitting: *Deleted

## 2019-05-11 NOTE — Telephone Encounter (Addendum)
The request has been approved. The authorization is effective for a maximum of 1 fills from 05/11/2019 to 06/10/2019, as long as the member is enrolled in their current health plan. The request was reviewed and approved by a licensed clinical pharmacist. This request is approved for up to 40mL per fill. An additional authorization has been entered for [facility] allowing 24mL per 30 days, effective 06/03/2019 through 10/31/2019; please reference authorization [id number]. Renewal requires that the patient has experienced a reduction in migraine or headache frequency of at least 2 days per month OR the patient has experienced a reduction in migraine severity OR migraine duration with Emgality therapy. Copy of approval letter faxed to pharmacy.  My chart sent to patient.

## 2019-05-15 ENCOUNTER — Encounter: Payer: Self-pay | Admitting: Obstetrics and Gynecology

## 2019-05-16 ENCOUNTER — Other Ambulatory Visit: Payer: Self-pay | Admitting: Obstetrics and Gynecology

## 2019-05-16 DIAGNOSIS — Z3044 Encounter for surveillance of vaginal ring hormonal contraceptive device: Secondary | ICD-10-CM

## 2019-05-16 MED ORDER — ETONOGESTREL-ETHINYL ESTRADIOL 0.12-0.015 MG/24HR VA RING: VAGINAL_RING | VAGINAL | 3 refills | Status: DC

## 2019-05-16 NOTE — Progress Notes (Signed)
Rx POP change back to nuvaring since no improvement with headaches.

## 2019-05-17 NOTE — Telephone Encounter (Signed)
Can you look into this prior auth for me? Thx

## 2019-05-17 NOTE — Telephone Encounter (Addendum)
Received message from patient stating pharmacy needs Rx to state patient needs two pens for loading dose in first month. [facility] pharmacy, spoke with pharmacist, Cornelius Moras and gave her new verbal Rx per Dr Leta Baptist. She verbalized understanding, appreciation.

## 2019-05-25 ENCOUNTER — Ambulatory Visit: Payer: No Typology Code available for payment source

## 2019-05-25 ENCOUNTER — Other Ambulatory Visit: Payer: Self-pay

## 2019-05-25 DIAGNOSIS — R519 Headache, unspecified: Secondary | ICD-10-CM

## 2019-05-25 MED ORDER — GADOBENATE DIMEGLUMINE 529 MG/ML IV SOLN
15.0000 mL | Freq: Once | INTRAVENOUS | Status: AC | PRN
  Administered 2019-05-25: 15 mL via INTRAVENOUS

## 2019-05-30 ENCOUNTER — Telehealth: Payer: Self-pay | Admitting: *Deleted

## 2019-05-30 NOTE — Telephone Encounter (Signed)
LVM informing patient her MRI brain result is normal. Advised she continue with medications as prescribed, reminded her of FU. Left # for questions.

## 2019-06-27 ENCOUNTER — Telehealth: Payer: Self-pay | Admitting: Obstetrics and Gynecology

## 2019-06-27 NOTE — Telephone Encounter (Signed)
LM for pt to leave nuvaring in place for 28 days for cont dosing due to headaches. If fails this regimen, will further pursue prior auth with insurance co.

## 2019-09-07 NOTE — Patient Instructions (Addendum)
We will continue topiramate 100mg  twice daily. Continue Emgality monthly. Use rizatriptan or other OTC medications sparingly.   Stay well hydrated. Well balanced diet and regular exercise advised.    Follow up in 1 year, sooner if needed    Migraine Headache A migraine headache is a very strong throbbing pain on one side or both sides of your head. This type of headache can also cause other symptoms. It can last from 4 hours to 3 days. Talk with your doctor about what things may bring on (trigger) this condition. What are the causes? The exact cause of this condition is not known. This condition may be triggered or caused by:  Drinking alcohol.  Smoking.  Taking medicines, such as: ? Medicine used to treat chest pain (nitroglycerin). ? Birth control pills. ? Estrogen. ? Some blood pressure medicines.  Eating or drinking certain products.  Doing physical activity. Other things that may trigger a migraine headache include:  Having a menstrual period.  Pregnancy.  Hunger.  Stress.  Not getting enough sleep or getting too much sleep.  Weather changes.  Tiredness (fatigue). What increases the risk?  Being 63-62 years old.  Being female.  Having a family history of migraine headaches.  Being Caucasian.  Having depression or anxiety.  Being very overweight. What are the signs or symptoms?  A throbbing pain. This pain may: ? Happen in any area of the head, such as on one side or both sides. ? Make it hard to do daily activities. ? Get worse with physical activity. ? Get worse around bright lights or loud noises.  Other symptoms may include: ? Feeling sick to your stomach (nauseous). ? Vomiting. ? Dizziness. ? Being sensitive to bright lights, loud noises, or smells.  Before you get a migraine headache, you may get warning signs (an aura). An aura may include: ? Seeing flashing lights or having blind spots. ? Seeing bright spots, halos, or zigzag  lines. ? Having tunnel vision or blurred vision. ? Having numbness or a tingling feeling. ? Having trouble talking. ? Having weak muscles.  Some people have symptoms after a migraine headache (postdromal phase), such as: ? Tiredness. ? Trouble thinking (concentrating). How is this treated?  Taking medicines that: ? Relieve pain. ? Relieve the feeling of being sick to your stomach. ? Prevent migraine headaches.  Treatment may also include: ? Having acupuncture. ? Avoiding foods that bring on migraine headaches. ? Learning ways to control your body functions (biofeedback). ? Therapy to help you know and deal with negative thoughts (cognitive behavioral therapy). Follow these instructions at home: Medicines  Take over-the-counter and prescription medicines only as told by your doctor.  Ask your doctor if the medicine prescribed to you: ? Requires you to avoid driving or using heavy machinery. ? Can cause trouble pooping (constipation). You may need to take these steps to prevent or treat trouble pooping:  Drink enough fluid to keep your pee (urine) pale yellow.  Take over-the-counter or prescription medicines.  Eat foods that are high in fiber. These include beans, whole grains, and fresh fruits and vegetables.  Limit foods that are high in fat and sugar. These include fried or sweet foods. Lifestyle  Do not drink alcohol.  Do not use any products that contain nicotine or tobacco, such as cigarettes, e-cigarettes, and chewing tobacco. If you need help quitting, ask your doctor.  Get at least 8 hours of sleep every night.  Limit and deal with stress. General instructions  Keep a journal to find out what may bring on your migraine headaches. For example, write down: ? What you eat and drink. ? How much sleep you get. ? Any change in what you eat or drink. ? Any change in your medicines.  If you have a migraine headache: ? Avoid things that make your symptoms  worse, such as bright lights. ? It may help to lie down in a dark, quiet room. ? Do not drive or use heavy machinery. ? Ask your doctor what activities are safe for you.  Keep all follow-up visits as told by your doctor. This is important. Contact a doctor if:  You get a migraine headache that is different or worse than others you have had.  You have more than 15 headache days in one month. Get help right away if:  Your migraine headache gets very bad.  Your migraine headache lasts longer than 72 hours.  You have a fever.  You have a stiff neck.  You have trouble seeing.  Your muscles feel weak or like you cannot control them.  You start to lose your balance a lot.  You start to have trouble walking.  You pass out (faint).  You have a seizure. Summary  A migraine headache is a very strong throbbing pain on one side or both sides of your head. These headaches can also cause other symptoms.  This condition may be treated with medicines and changes to your lifestyle.  Keep a journal to find out what may bring on your migraine headaches.  Contact a doctor if you get a migraine headache that is different or worse than others you have had.  Contact your doctor if you have more than 15 headache days in a month. This information is not intended to replace advice given to you by your health care provider. Make sure you discuss any questions you have with your health care provider. Document Revised: 09/03/2018 Document Reviewed: 06/24/2018 Elsevier Patient Education  [place].

## 2019-09-07 NOTE — Progress Notes (Signed)
PATIENT: Christy Whitehead DOB: 1985/12/15  REASON FOR VISIT: follow up HISTORY FROM: patient  Chief Complaint  Patient presents with  . Headache    rm 7, 4 month FU "Emgality doing well, switched birth control and working more hours, headaches have increased some but not horrible, no increase in migraines, take OTC at times"     HISTORY OF PRESENT ILLNESS: Today 09/08/19 Christy Whitehead is a 34 y.o. female here today for follow up for migraines. She continues topiramate 100mg  BID and Emgality monthly. Rizatriptan continued for abortive therapy. She continues to have about 10-12 times a month. 4-5 are migrainous. Rizatriptan helps some. She also takes Tylenol, Excedrin or Ibuprofen a couple of times a week. Intensity of headaches is significantly improved. Previously having 10/10 pain with migraines, now maybe 2-3/10. She switched to Nuvaring and feels that this has helped. She is an 02-02-2001. She works nights.    HISTORY: (copied from Dr Nutritional therapist note on 05/10/2019)  UPDATE (05/10/19, VRP): Since last visit, was doing well for a few months, with aimovig, then in last 2 months slightly worse. Now with daily tension headaches. Also 1-2 severe migraine per month. Symptoms are increased. Stress, N95 masks, may be aggravating factors. Tried adjusting birth control hormones with ob/gyn, but did not help. Some injection site reaction with aimovig. TPX and rizatriptan working.    PRIOR HPI: 34 year old female here for evaluation migraine headaches.  Patient has had migraine headaches since teenage years.  She describes frontal, sometimes unilateral, throbbing severe headaches associated with phonophobia and nausea.  No visual aura.  Patient also has some lower back pain daily headaches.  Patient has previously tried topiramate, Tofranil and imipramine.  She has tried trigger point injections with mild relief.  She has tried sumatriptan and Fioricet with mild relief.  She is  also used Tylenol, ibuprofen and Robaxin.  Patient has approximately 1 week of severe headaches per month in addition to daily headaches.  No specific triggering factors.  Patient has tried to improve her nutrition, sleep and caffeine intake.  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   REVIEW OF SYSTEMS: Out of a complete 14 system review of symptoms, the patient complains only of the following symptoms, headaches and all other reviewed systems are negative.  ALLERGIES: Allergies  Allergen Reactions  . Aimovig [Erenumab-Aooe] Hives    Site reaction    HOME MEDICATIONS: Outpatient Medications Prior to Visit  Medication Sig Dispense Refill  . acetaminophen (TYLENOL) 500 MG tablet Take 500 mg by mouth every 6 (six) hours as needed.    . ALPRAZolam (XANAX) 0.5 MG tablet for sedation before MRI scan; take 1 tab 1 hour before scan; may repeat 1 tab 15 min before scan 3 tablet 0  . Aspirin-Acetaminophen-Caffeine (EXCEDRIN PO) Take by mouth as needed.    . etonogestrel-ethinyl estradiol (NUVARING) 0.12-0.015 MG/24HR vaginal ring Insert vaginally and leave in place for 3 consecutive weeks, then replace for CONTINUOUS DOSING due to headaches 3 each 3  . Galcanezumab-gnlm (EMGALITY) 120 MG/ML SOAJ Inject 120 mg into the skin every 30 (thirty) days. 3 pen 4  . methocarbamol (ROBAXIN) 500 MG tablet   1  . ondansetron (ZOFRAN-ODT) 4 MG disintegrating tablet Take 1 tablet (4 mg total) by mouth every 8 (eight) hours as needed for nausea or vomiting. 10 tablet 3  . topiramate (TOPAMAX) 100 MG tablet Take 1 tablet (100 mg total) by mouth 2 (two) times daily. 180 tablet  4  . rizatriptan (MAXALT-MLT) 10 MG disintegrating tablet Take 1 tablet (10 mg total) by mouth as needed for migraine. May repeat in 2 hours if needed 9 tablet 11   No facility-administered medications prior to visit.    PAST MEDICAL HISTORY: Past Medical History:  Diagnosis Date  . Abnormal Pap smear of cervix  12/19/2008, 02/20/2009   ASCUS  . Abnormal Pap smear of cervix 08/06/2010, 10/01/2010   HGSIL  . Cervical dysplasia CIN1 06/05/2004  . CIN III (cervical intraepithelial neoplasia grade III) with severe dysplasia 12/20/2010   LEEP Ecto Cervix  . Dysmenorrhea   . Migraines     PAST SURGICAL HISTORY: Past Surgical History:  Procedure Laterality Date  . CHOLECYSTECTOMY  2015  . COLPOSCOPY  06/05/2004,02/20/2009,10/01/2010  . LEEP  12/20/2010   ECC &Endo Cx Negative, Ecto CIN 2-3    FAMILY HISTORY: Family History  Problem Relation Age of Onset  . COPD Mother   . Diabetes Mother   . Diabetes Father   . Hypertension Father   . Bipolar disorder Father   . Breast cancer Paternal Grandmother 35  . Diabetes Sister   . Obesity Sister        morbid  . Mental retardation Sister   . Healthy Daughter   . Suicidality Paternal Grandfather     SOCIAL HISTORY: Social History   Socioeconomic History  . Marital status: Single    Spouse name: Not on file  . Number of children: 1  . Years of education: College  . Highest education level: Bachelor's degree (e.g., BA, AB, BS)  Occupational History    Comment: RN in ED, night shift  Tobacco Use  . Smoking status: Former Smoker    Packs/day: 1.00    Years: 13.00    Pack years: 13.00    Quit date: 2018    Years since quitting: 3.2  . Smokeless tobacco: Never Used  Substance and Sexual Activity  . Alcohol use: Yes    Comment: once every 2 weeks, occasionally >4  . Drug use: No  . Sexual activity: Not Currently    Birth control/protection: Inserts    Comment: nuvaring  Other Topics Concern  . 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 Difficulty of Paying Living Expenses: Not hard at all  Food Insecurity:   . Worried About Programme researcher, broadcasting/film/video in the Last Year:   . Barista in the Last Year:   Transportation Needs:   . Freight forwarder (Medical):   Marland Kitchen Lack of Transportation (Non-Medical):   Physical Activity:   . Days of Exercise per Week:   . Minutes of Exercise per Session:   Stress:   . Feeling of Stress :   Social Connections:   . Frequency of Communication with Friends and Family:   . Frequency of Social Gatherings with Friends and Family:   . Attends Religious Services:   . Active Member of Clubs or Organizations:   . Attends Banker Meetings:   Marland Kitchen Marital  Status:   Intimate Partner Violence:   . Fear of Current or Ex-Partner:   . Emotionally Abused:   Marland Kitchen Physically Abused:   . Sexually Abused:       PHYSICAL EXAM  Vitals:   09/08/19 0755  BP: 105/68  Pulse: (!) 55  Temp: 97.6 F (36.4 C)  Weight: 152 lb 12.8 oz (69.3 kg)  Height: 5\' 4"  (1.626 m)   Body mass index is 26.23 kg/m.  Generalized: Well developed, in no acute distress  Cardiology: normal rate and rhythm, no murmur noted Respiratory: clear to auscultation bilaterally  Neurological examination  Mentation: Alert oriented to time, place, history taking. Follows all commands speech and language fluent Cranial nerve II-XII: Pupils were equal round reactive to light. Extraocular movements were full, visual field were full on confrontational test. Facial sensation and strength were normal. Head turning and shoulder shrug  were normal and symmetric. Motor: The motor testing reveals 5 over 5 strength of all 4 extremities. Good symmetric motor tone is noted throughout.  Sensory: Sensory testing is intact to soft touch on all 4 extremities. No evidence of extinction is noted.  Coordination: Cerebellar testing reveals good finger-nose-finger and  heel-to-shin bilaterally.  Gait and station: Gait is normal.     DIAGNOSTIC DATA (LABS, IMAGING, TESTING) - I reviewed patient records, labs, notes, testing and imaging myself where available.  No flowsheet data found.   No results found for: WBC, HGB, HCT, MCV, PLT No results found for: NA, K, CL, CO2, GLUCOSE, BUN, CREATININE, CALCIUM, PROT, ALBUMIN, AST, ALT, ALKPHOS, BILITOT, GFRNONAA, GFRAA No results found for: CHOL, HDL, LDLCALC, LDLDIRECT, TRIG, CHOLHDL No results found for: HGBA1C No results found for: VITAMINB12 No results found for: TSH     ASSESSMENT AND PLAN 34 y.o. year old female  has a past medical history of Abnormal Pap smear of cervix (12/19/2008, 02/20/2009), Abnormal Pap smear of cervix (08/06/2010, 10/01/2010), Cervical dysplasia CIN1 (06/05/2004), CIN III (cervical intraepithelial neoplasia grade III) with severe dysplasia (12/20/2010), Dysmenorrhea, and Migraines. here with     ICD-10-CM   1. Migraine without aura and without status migrainosus, not intractable  G43.009     Nakima is doing fairly well on current treatment regimen. We will continue topiramate 100mg  twice daily and Emgality monthly. She will use rizatriptan as needed for abortive therapy. She may use OTC analgesics but was advised against regular use to avoid concerns of rebound headaches. Adequate hydration, well balanced diet and regular exercise advised. She will follow up in 1 year, sooner if needed. She verbalizes understanding and agreement with this plan.    No orders of the defined types were placed in this encounter.    Meds ordered this encounter  Medications  . rizatriptan (MAXALT-MLT) 10 MG disintegrating tablet    Sig: Take 1 tablet (10 mg total) by mouth as needed for migraine. May repeat in 2 hours if needed    Dispense:  9 tablet    Refill:  11    Order Specific Question:   Supervising Provider    Answer:   Melvenia Beam [id number]      I spent 15 minutes with the patient.  50% of this time was spent counseling and educating patient on plan of care and medications.    Debbora Presto, FNP-C 09/08/2019, 8:37 AM [facility] [address], [place], [place] [address] [phone]

## 2019-09-08 ENCOUNTER — Encounter: Payer: Self-pay | Admitting: Family Medicine

## 2019-09-08 ENCOUNTER — Other Ambulatory Visit: Payer: Self-pay

## 2019-09-08 ENCOUNTER — Ambulatory Visit: Payer: No Typology Code available for payment source | Admitting: Family Medicine

## 2019-09-08 VITALS — BP 105/68 | HR 55 | Temp 97.6°F | Ht 64.0 in | Wt 152.8 lb

## 2019-09-08 DIAGNOSIS — G43009 Migraine without aura, not intractable, without status migrainosus: Secondary | ICD-10-CM

## 2019-09-08 MED ORDER — RIZATRIPTAN BENZOATE 10 MG PO TBDP: 10.0000 mg | ORAL_TABLET | ORAL | 11 refills | Status: DC | PRN

## 2019-09-20 NOTE — Progress Notes (Signed)
I reviewed note and agree with plan.   Suanne Marker, MD 09/20/2019, 9:25 AM Certified in Neurology, Neurophysiology and Neuroimaging  [facility] [address] [place], [place] [address] [phone]

## 2019-11-07 ENCOUNTER — Encounter: Payer: Self-pay | Admitting: Family Medicine

## 2019-11-07 NOTE — Telephone Encounter (Signed)
Stop emgality and aimovig. Consider botox. -VRP

## 2019-11-10 ENCOUNTER — Encounter: Payer: Self-pay | Admitting: *Deleted

## 2019-11-15 ENCOUNTER — Telehealth: Payer: Self-pay | Admitting: *Deleted

## 2019-11-15 NOTE — Telephone Encounter (Signed)
Per Amy, NP she wants follow up with patient to discuss migraine management options. Called patient, LVM advising her of this and that Amy has 3 openings tomorrow. No other openings until Sept. I asked that she call in morning or send my chart to let me know if she can come.

## 2020-03-27 NOTE — Progress Notes (Signed)
PA submitted for Aimovig 70mg  on Cover my Meds. There is a 24/72hr turn around. Key: Status: PENDING   [employer] is processing your PA request and will respond shortly with next steps. You may close this dialog, return to your dashboard, and perform other tasks. To check for an update later, open this request again from your dashboard.  If you need assistance, please chat with CoverMyMeds or call [email or web address] at [phone].

## 2020-03-28 NOTE — Progress Notes (Signed)
PA cancelled.  Pt was instructed to stop Aimovig and consider Botox per Dr.Penumalli's note PA request came through because of the prior approval.

## 2020-05-10 ENCOUNTER — Other Ambulatory Visit: Payer: Self-pay | Admitting: Family Medicine

## 2020-05-10 ENCOUNTER — Other Ambulatory Visit: Payer: Self-pay | Admitting: Diagnostic Neuroimaging

## 2020-05-16 ENCOUNTER — Other Ambulatory Visit: Payer: Self-pay | Admitting: Obstetrics and Gynecology

## 2020-05-16 DIAGNOSIS — Z3044 Encounter for surveillance of vaginal ring hormonal contraceptive device: Secondary | ICD-10-CM

## 2020-05-17 ENCOUNTER — Other Ambulatory Visit: Payer: Self-pay | Admitting: Obstetrics and Gynecology

## 2020-05-17 MED ORDER — ETONOGESTREL-ETHINYL ESTRADIOL 0.12-0.015 MG/24HR VA RING: VAGINAL_RING | VAGINAL | 3 refills | Status: DC

## 2020-05-17 NOTE — Telephone Encounter (Signed)
Pt calling; has sched appt for 2/3rd; needs bc refill.  [phone] Pt aware ref eRx'd.

## 2020-05-17 NOTE — Addendum Note (Signed)
Addended by: Loran Senters D on: 05/17/2020 11:00 AM   Modules accepted: Orders

## 2020-06-27 NOTE — Progress Notes (Signed)
PCP:  [name], MD   Chief Complaint  Patient presents with  . Gynecologic Exam    No concerns     HPI:      [name] is a 35 y.o. G1P1001 who LMP was Patient's last menstrual period was 04/01/2020 (exact date)., presents today for her annual examination.  Her menses are irregular with cont dosing nuvaring for migraine headache prevention, lasting 5 days.  Dysmenorrhea none. She does not have intermenstrual bleeding. Hx of migraine without aura for many yrs. On topamax, did aimovig last yr with significant HA improvement but had to stop due to injection site reaction. Couldn't tolerate other meds, headaches really bad now. Has f/u with neuro 4/22. May do botox for sx.   Sex activity: not sex active- NuvaRing vaginal inserts.  Last Pap: 02/03/19  Results were: no abnormalities /neg HPV DNA 2019 Hx of STDs: HPV--hx of CIN3 with LEEP 2012  There is a FH of breast cancer in her PGM, genetic testing not indicated. There is no FH of ovarian cancer. The patient does not do self-breast exams.  Tobacco use: The patient denies current or previous tobacco use. Alcohol use: social drinker No drug use.  Exercise: moderately active  She does get adequate calcium but not Vitamin D in her diet.  Labs with PCP  Past Medical History:  Diagnosis Date  . Abnormal Pap smear of cervix 12/19/2008, 02/20/2009   ASCUS  . Abnormal Pap smear of cervix 08/06/2010, 10/01/2010   HGSIL  . Cervical dysplasia CIN1 06/05/2004  . CIN III (cervical intraepithelial neoplasia grade III) with severe dysplasia 12/20/2010   LEEP Ecto Cervix  . Dysmenorrhea   . Migraines     Past Surgical History:  Procedure Laterality Date  . CHOLECYSTECTOMY  2015  . COLPOSCOPY  06/05/2004,02/20/2009,10/01/2010  . LEEP  12/20/2010   ECC &Endo Cx Negative, Ecto CIN 2-3    Family History  Problem Relation Age of Onset  . COPD Mother   . Diabetes Mother   . Diabetes Father   . Hypertension Father   .  Bipolar disorder Father   . Breast cancer Paternal Grandmother 83  . Diabetes Sister   . Obesity Sister        morbid  . Mental retardation Sister   . Healthy Daughter   . Suicidality Paternal Grandfather     Social History   Socioeconomic History  . Marital status: Single    Spouse name: Not on file  . Number of children: 1  . Years of education: College  . Highest education level: Bachelor's degree (e.g., BA, AB, BS)  Occupational History    Comment: RN in ED, night shift  Tobacco Use  . Smoking status: Former Smoker    Packs/day: 1.00    Years: 13.00    Pack years: 13.00    Quit date: 2018    Years since quitting: 4.0  . Smokeless tobacco: Never Used  Vaping Use  . Vaping Use: Never used  Substance and Sexual Activity  . Alcohol use: Yes    Comment: once every 2 weeks, occasionally >4  . Drug use: No  . Sexual activity: Not Currently    Birth control/protection: Inserts    Comment: nuvaring  Other Topics Concern  . 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[employer]   One Daughter - Sherol Dade - 53 years old now   Family support: dad  is nearby and watches daughter   Enjoys: works a lot, spend time with friends, hiking, corn-hole, beach   Exercise: gym regularly - 1-2 since the gym closed   Diet: pretty healthy overall   Social Determinants of Corporate investment banker Strain: Not on file  Food Insecurity: Not on file  Transportation Needs: Not on file  Physical Activity: Not on file  Stress: Not on file  Social Connections: Not on file  Intimate Partner Violence: Not on file    Outpatient Medications Prior to Visit  Medication Sig Dispense Refill  . acetaminophen (TYLENOL) 500 MG tablet Take 500 mg by mouth every 6 (six) hours as needed.    . Aspirin-Acetaminophen-Caffeine (EXCEDRIN PO) Take by mouth as needed.    . ondansetron (ZOFRAN-ODT) 4 MG disintegrating tablet Take 1 tablet (4 mg total) by mouth every 8 (eight)  hours as needed for nausea or vomiting. 10 tablet 3  . rizatriptan (MAXALT-MLT) 10 MG disintegrating tablet Take 1 tablet (10 mg total) by mouth as needed for migraine. May repeat in 2 hours if needed 9 tablet 11  . SF 5000 PLUS 1.1 % CREA dental cream Take by mouth.    . topiramate (TOPAMAX) 100 MG tablet TAKE 1 TABLET BY MOUTH TWICE DAILY 180 tablet 4  . etonogestrel-ethinyl estradiol (NUVARING) 0.12-0.015 MG/24HR vaginal ring Insert vaginally and leave in place for 3 consecutive weeks, then replace for CONTINUOUS DOSING due to headaches 3 each 3  . ALPRAZolam (XANAX) 0.5 MG tablet for sedation before MRI scan; take 1 tab 1 hour before scan; may repeat 1 tab 15 min before scan 3 tablet 0  . Galcanezumab-gnlm (EMGALITY) 120 MG/ML SOAJ Inject 120 mg into the skin every 30 (thirty) days. 3 pen 4  . methocarbamol (ROBAXIN) 500 MG tablet   1   No facility-administered medications prior to visit.      ROS:  Review of Systems  Constitutional: Negative for fatigue, fever and unexpected weight change.  Respiratory: Negative for cough, shortness of breath and wheezing.   Cardiovascular: Negative for chest pain, palpitations and leg swelling.  Gastrointestinal: Negative for blood in stool, constipation, diarrhea, nausea and vomiting.  Endocrine: Negative for cold intolerance, heat intolerance and polyuria.  Genitourinary: Negative for dyspareunia, dysuria, flank pain, frequency, genital sores, hematuria, menstrual problem, pelvic pain, urgency, vaginal bleeding, vaginal discharge and vaginal pain.  Musculoskeletal: Negative for back pain, joint swelling and myalgias.  Skin: Negative for rash.  Neurological: Positive for headaches. Negative for dizziness, syncope, light-headedness and numbness.  Hematological: Negative for adenopathy.  Psychiatric/Behavioral: Negative for agitation, confusion, sleep disturbance and suicidal ideas. The patient is not nervous/anxious.    BREAST: No  symptoms   Objective: BP 100/70   Ht 5\' 4"  (1.626 m)   Wt 165 lb (74.8 kg)   LMP 04/01/2020 (Exact Date)   BMI 28.32 kg/m    Physical Exam Constitutional:      Appearance: She is well-developed.  Genitourinary:     Vulva normal.     Right Labia: No rash, tenderness or lesions.    Left Labia: No tenderness, lesions or rash.    No vaginal discharge, erythema or tenderness.      Right Adnexa: not tender and no mass present.    Left Adnexa: not tender and no mass present.    No cervical motion tenderness, friability or polyp.     Uterus is not enlarged or tender.  Breasts:     Right: No mass, nipple discharge,  skin change or tenderness.     Left: No mass, nipple discharge, skin change or tenderness.    Neck:     Thyroid: No thyromegaly.  Cardiovascular:     Rate and Rhythm: Normal rate and regular rhythm.     Heart sounds: Normal heart sounds. No murmur heard.   Pulmonary:     Effort: Pulmonary effort is normal.     Breath sounds: Normal breath sounds.  Abdominal:     Palpations: Abdomen is soft.     Tenderness: There is no abdominal tenderness. There is no guarding or rebound.  Musculoskeletal:        General: Normal range of motion.     Cervical back: Normal range of motion.  Lymphadenopathy:     Cervical: No cervical adenopathy.  Neurological:     General: No focal deficit present.     Mental Status: She is alert and oriented to person, place, and time.     Cranial Nerves: No cranial nerve deficit.  Skin:    General: Skin is warm and dry.  Psychiatric:        Mood and Affect: Mood normal.        Behavior: Behavior normal.        Thought Content: Thought content normal.        Judgment: Judgment normal.  Vitals reviewed.     Assessment/Plan: Encounter for annual routine gynecological examination  Cervical cancer screening - Plan: Cytology - PAP  History of cervical dysplasia - Plan: Cytology - PAP  Encounter for surveillance of vaginal ring hormonal  contraceptive device - Plan: etonogestrel-ethinyl estradiol (NUVARING) 0.12-0.015 MG/24HR vaginal ring    Meds ordered this encounter  Medications  . etonogestrel-ethinyl estradiol (NUVARING) 0.12-0.015 MG/24HR vaginal ring    Sig: Insert vaginally and leave in place for 3 consecutive weeks, then replace for CONTINUOUS DOSING due to headaches    Dispense:  3 each    Refill:  4    Order Specific Question:   Supervising Provider    Answer:   Nadara Mustard [[id number]]             GYN counsel adequate intake of calcium and vitamin D, diet and exercise     F/U  Return in about 1 year (around 06/28/2021).  Kariyah Baugh B. Jamaine Quintin, PA-C 06/28/2020 9:04 AM

## 2020-06-28 ENCOUNTER — Other Ambulatory Visit: Payer: Self-pay

## 2020-06-28 ENCOUNTER — Other Ambulatory Visit: Payer: Self-pay | Admitting: Obstetrics and Gynecology

## 2020-06-28 ENCOUNTER — Encounter: Payer: Self-pay | Admitting: Obstetrics and Gynecology

## 2020-06-28 ENCOUNTER — Ambulatory Visit (INDEPENDENT_AMBULATORY_CARE_PROVIDER_SITE_OTHER): Payer: No Typology Code available for payment source | Admitting: Obstetrics and Gynecology

## 2020-06-28 ENCOUNTER — Other Ambulatory Visit (HOSPITAL_COMMUNITY)
Admission: RE | Admit: 2020-06-28 | Discharge: 2020-06-28 | Disposition: A | Discharge status: Home / Self Care | Payer: No Typology Code available for payment source | Source: Ambulatory Visit | Attending: Obstetrics and Gynecology | Admitting: Obstetrics and Gynecology

## 2020-06-28 VITALS — BP 100/70 | Ht 64.0 in | Wt 165.0 lb

## 2020-06-28 DIAGNOSIS — Z124 Encounter for screening for malignant neoplasm of cervix: Secondary | ICD-10-CM | POA: Diagnosis not present

## 2020-06-28 DIAGNOSIS — Z87410 Personal history of cervical dysplasia: Secondary | ICD-10-CM | POA: Insufficient documentation

## 2020-06-28 DIAGNOSIS — Z3044 Encounter for surveillance of vaginal ring hormonal contraceptive device: Secondary | ICD-10-CM | POA: Diagnosis not present

## 2020-06-28 DIAGNOSIS — Z01419 Encounter for gynecological examination (general) (routine) without abnormal findings: Secondary | ICD-10-CM | POA: Diagnosis not present

## 2020-06-28 MED ORDER — ETONOGESTREL-ETHINYL ESTRADIOL 0.12-0.015 MG/24HR VA RING: VAGINAL_RING | VAGINAL | 4 refills | Status: DC

## 2020-06-28 NOTE — Patient Instructions (Signed)
I value your feedback and you entrusting us with your care. If you get a [facility] patient survey, I would appreciate you taking the time to let us know about your experience today. Thank you! ? ? ?

## 2020-07-03 LAB — CYTOLOGY - PAP
Diagnosis: NEGATIVE
Diagnosis: REACTIVE

## 2020-08-25 ENCOUNTER — Other Ambulatory Visit: Payer: Self-pay

## 2020-08-25 MED FILL — Topiramate Tab 100 MG: ORAL | 90 days supply | Qty: 180 | Fill #0 | Status: AC

## 2020-08-28 ENCOUNTER — Other Ambulatory Visit: Payer: Self-pay

## 2020-09-05 ENCOUNTER — Other Ambulatory Visit (HOSPITAL_COMMUNITY): Payer: Self-pay

## 2020-09-11 ENCOUNTER — Encounter: Payer: Self-pay | Admitting: Family Medicine

## 2020-09-11 ENCOUNTER — Other Ambulatory Visit: Payer: Self-pay

## 2020-09-11 ENCOUNTER — Ambulatory Visit (INDEPENDENT_AMBULATORY_CARE_PROVIDER_SITE_OTHER): Payer: No Typology Code available for payment source | Admitting: Family Medicine

## 2020-09-11 VITALS — BP 103/57 | HR 62 | Ht 64.0 in | Wt 165.0 lb

## 2020-09-11 DIAGNOSIS — G43009 Migraine without aura, not intractable, without status migrainosus: Secondary | ICD-10-CM | POA: Diagnosis not present

## 2020-09-11 MED ORDER — QULIPTA 60 MG PO TABS
60.0000 mg | ORAL_TABLET | Freq: Every day | ORAL | 3 refills | Status: DC
  Filled 2020-09-11 – 2020-09-18 (×2): qty 90, 90d supply, fill #0
  Filled 2020-12-14: qty 90, 90d supply, fill #1
  Filled 2021-03-05: qty 90, 90d supply, fill #2
  Filled 2021-06-06 – 2021-06-13 (×3): qty 90, 90d supply, fill #3

## 2020-09-11 MED ORDER — SUMATRIPTAN SUCCINATE 100 MG PO TABS
100.0000 mg | ORAL_TABLET | Freq: Once | ORAL | 2 refills | Status: DC | PRN
  Filled 2020-09-11: qty 9, 30d supply, fill #0

## 2020-09-11 NOTE — Progress Notes (Signed)
PATIENT: Artice Holohan DOB: 1986/03/19  REASON FOR VISIT: follow up HISTORY FROM: patient  Chief Complaint  Patient presents with  . Follow-up    Rm 1 alone Pt isn't well, migraines have increased since stopping injections, has about 3-4 a week.      HISTORY OF PRESENT ILLNESS: 09/11/20 ALL: She returns for migraine follow up. She continues topiramate 100mg  BID. She was switched from Amovig to [facility] due to local reaction but reports reaction was worse with Emgality. She stopped CGRP about a year ago. Migraines have worsened over past year. Now having daily headaches. She has about 4 bad migraines per month. Some last several days. Usually takes Tylenol or ibuprofen. She takes rizatriptan about 2-3 times a month. It does not usually help.   09/08/2019 ALL:  Terrisa Curfman is a 35 y.o. female here today for follow up for migraines. She continues topiramate 100mg  BID and Emgality monthly. Rizatriptan continued for abortive therapy. She continues to have about 10-12 times a month. 4-5 are migrainous. Rizatriptan helps some. She also takes Tylenol, Excedrin or Ibuprofen a couple of times a week. Intensity of headaches is significantly improved. Previously having 10/10 pain with migraines, now maybe 2-3/10. She switched to Nuvaring and feels that this has helped. She is an . She works nights.    HISTORY: (copied from Dr 02-02-2001 note on 05/10/2019)  UPDATE (05/10/19, VRP): Since last visit, was doing well for a few months, with aimovig, then in last 2 months slightly worse. Now with daily tension headaches. Also 1-2 severe migraine per month. Symptoms are increased. Stress, N95 masks, may be aggravating factors. Tried adjusting birth control hormones with ob/gyn, but did not help. Some injection site reaction with aimovig. TPX and rizatriptan working.    PRIOR HPI: 35 year old female here for evaluation migraine headaches.  Patient has had migraine  headaches since teenage years.  She describes frontal, sometimes unilateral, throbbing severe headaches associated with phonophobia and nausea.  No visual aura.  Patient also has some lower back pain daily headaches.  Patient has previously tried topiramate, Tofranil and imipramine.  She has tried trigger point injections with mild relief.  She has tried sumatriptan and Fioricet with mild relief.  She is also used Tylenol, ibuprofen and Robaxin.  Patient has approximately 1 week of severe headaches per month in addition to daily headaches.  No specific triggering factors.  Patient has tried to improve her nutrition, sleep and caffeine intake.  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  REVIEW OF SYSTEMS: Out of a complete 14 system review of symptoms, the patient complains only of the following symptoms, headaches and all other reviewed systems are negative.  ALLERGIES: Allergies  Allergen Reactions  . Aimovig [Erenumab-Aooe] Hives    Site reaction  . Emgality [Galcanezumab-Gnlm] Hives    Site reaction    HOME MEDICATIONS: Outpatient Medications Prior to Visit  Medication Sig Dispense Refill  . acetaminophen (TYLENOL) 500 MG tablet Take 500 mg by mouth every 6 (six) hours as needed.    . Aspirin-Acetaminophen-Caffeine (EXCEDRIN PO) Take by mouth as needed.    . etonogestrel-ethinyl estradiol (NUVARING) 0.12-0.015 MG/24HR vaginal ring INSERT VAGINALLY AND LEAVE IN PLACE FOR 3 CONSECUTIVE WEEKS, THEN REPLACE FOR CONTINUOUS DOSING DUE TO HEADACHES (Patient taking differently: INSERT VAGINALLY AND LEAVE IN PLACE FOR 3 CONSECUTIVE WEEKS, THEN REPLACE FOR CONTINUOUS DOSING DUE TO HEADACHES) 3 each 4  . etonogestrel-ethinyl estradiol (NUVARING) 0.12-0.015 MG/24HR vaginal ring INSERT VAGINALLY  AND LEAVE IN PLACE FOR 3 CONSECUTIVE WEEKS, THEN REPLACE FOR CONTINUOUS DOSING DUE TO HEADACHES (Patient taking differently: INSERT VAGINALLY AND LEAVE IN PLACE FOR 3 CONSECUTIVE WEEKS, THEN  REPLACE FOR CONTINUOUS DOSING DUE TO HEADACHES) 3 each 3  . ondansetron (ZOFRAN-ODT) 4 MG disintegrating tablet Take 1 tablet (4 mg total) by mouth every 8 (eight) hours as needed for nausea or vomiting. 10 tablet 3  . SF 5000 PLUS 1.1 % CREA dental cream Take by mouth.    . topiramate (TOPAMAX) 100 MG tablet TAKE 1 TABLET BY MOUTH TWICE DAILY 180 tablet 4  . rizatriptan (MAXALT-MLT) 10 MG disintegrating tablet Take 1 tablet (10 mg total) by mouth as needed for migraine. May repeat in 2 hours if needed 9 tablet 11   No facility-administered medications prior to visit.    PAST MEDICAL HISTORY: Past Medical History:  Diagnosis Date  . Abnormal Pap smear of cervix 12/19/2008, 02/20/2009   ASCUS  . Abnormal Pap smear of cervix 08/06/2010, 10/01/2010   HGSIL  . Cervical dysplasia CIN1 06/05/2004  . CIN III (cervical intraepithelial neoplasia grade III) with severe dysplasia 12/20/2010   LEEP Ecto Cervix  . Dysmenorrhea   . Migraines     PAST SURGICAL HISTORY: Past Surgical History:  Procedure Laterality Date  . CHOLECYSTECTOMY  2015  . COLPOSCOPY  06/05/2004,02/20/2009,10/01/2010  . LEEP  12/20/2010   ECC &Endo Cx Negative, Ecto CIN 2-3    FAMILY HISTORY: Family History  Problem Relation Age of Onset  . COPD Mother   . Diabetes Mother   . Diabetes Father   . Hypertension Father   . Bipolar disorder Father   . Breast cancer Paternal Grandmother 60  . Diabetes Sister   . Obesity Sister        morbid  . Mental retardation Sister   . Healthy Daughter   . Suicidality Paternal Grandfather     SOCIAL HISTORY: Social History   Socioeconomic History  . Marital status: Single    Spouse name: Not on file  . Number of children: 1  . Years of education: College  . Highest education level: Bachelor's degree (e.g., BA, AB, BS)  Occupational History    Comment: RN in ED, night shift  Tobacco Use  . Smoking status: Former Smoker    Packs/day: 1.00    Years: 13.00    Pack years: 13.00     Quit date: 2018    Years since quitting: 4.2  . Smokeless tobacco: Never Used  Vaping Use  . Vaping Use: Never used  Substance and Sexual Activity  . Alcohol use: Yes    Comment: once every 2 weeks, occasionally >4  . Drug use: No  . Sexual activity: Not Currently    Birth control/protection: Inserts    Comment: nuvaring  Other Topics Concern  . 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[employer]   One Daughter - Sherol Dade - 37 years old now   Family support: dad is nearby and watches daughter   Enjoys: works a lot, spend time with friends, hiking, corn-hole, beach   Exercise: gym regularly - 1-2 since the gym closed   Diet: pretty healthy overall   Social Determinants of Corporate investment banker Strain: Not on file  Food Insecurity: Not on file  Transportation Needs: Not on file  Physical Activity: Not on file  Stress: Not on file  Social Connections: Not on file  Intimate Partner Violence: Not on file      PHYSICAL EXAM  Vitals:   09/11/20 0733  BP: (!) 103/57  Pulse: 62  Weight: 165 lb (74.8 kg)  Height: 5\' 4"  (1.626 m)   Body mass index is 28.32 kg/m.  Generalized: Well developed, in no acute distress  Cardiology: normal rate and rhythm, no murmur noted Respiratory: clear to auscultation bilaterally  Neurological examination  Mentation: Alert oriented to time, place, history taking. Follows all commands speech and language fluent Cranial nerve II-XII: Pupils were equal round reactive to light. Extraocular movements were full, visual field were full on confrontational test. Facial sensation and strength were normal. Head turning and shoulder shrug  were normal and symmetric. Motor: The motor testing reveals 5 over 5 strength of all 4 extremities. Good symmetric motor tone is noted throughout.  Gait and station: Gait is normal.     DIAGNOSTIC DATA (LABS, IMAGING, TESTING) - I reviewed patient records, labs, notes, testing  and imaging myself where available.  No flowsheet data found.   No results found for: WBC, HGB, HCT, MCV, PLT No results found for: NA, K, CL, CO2, GLUCOSE, BUN, CREATININE, CALCIUM, PROT, ALBUMIN, AST, ALT, ALKPHOS, BILITOT, GFRNONAA, GFRAA No results found for: CHOL, HDL, LDLCALC, LDLDIRECT, TRIG, CHOLHDL No results found for: No results found for: VITAMINB12 No results found for: TSH     ASSESSMENT AND PLAN 35 y.o. year old female  has a past medical history of Abnormal Pap smear of cervix (12/19/2008, 02/20/2009), Abnormal Pap smear of cervix (08/06/2010, 10/01/2010), Cervical dysplasia CIN1 (06/05/2004), CIN III (cervical intraepithelial neoplasia grade III) with severe dysplasia (12/20/2010), Dysmenorrhea, and Migraines. here with     ICD-10-CM   1. Migraine without aura and without status migrainosus, not intractable  G43.009      Ilanna has had worsening headaches/migraines over the past year. She has not been able to tolerate Emgality due to local skin reaction. We will continue topiramate 100mg  twice daily. I will add Qulipta 60mg  daily. We will switch abortive therapy to sumatriptan as needed. She may use OTC analgesics but was advised against regular use to avoid concerns of rebound headaches. Adequate hydration, well balanced diet and regular exercise advised. She will follow up in 3 months, sooner if needed. She verbalizes understanding and agreement with this plan.    No orders of the defined types were placed in this encounter.    Meds ordered this encounter  Medications  . Atogepant (QULIPTA) 60 MG TABS    Sig: Take 1 tablet (60 mg) by mouth daily.    Dispense:  90 tablet    Refill:  3    Order Specific Question:   Supervising Provider    Answer:   Shanda Bumps  . SUMAtriptan (IMITREX) 100 MG tablet    Sig: Take 1 tablet (100 mg total) by mouth once as needed for up to 1 dose for migraine. May repeat in 2 hours if headache persists or recurs.     Dispense:  10 tablet    Refill:  2    Order Specific Question:   Supervising Provider    Answer:   Anson Fret      I spent 15 minutes with the patient. 50% of this time was spent counseling and educating patient on plan of care and medications.    [id number], FNP-C 09/11/2020, 8:35 AM [facility] [address], [address] [place], [place] [address] [phone]

## 2020-09-11 NOTE — Patient Instructions (Signed)
Below is our plan:  We will continue topiramate  twice daily. We will start Qulipta. You will take  daily. I will call this into your pharmacy. You will get a call from Qulipta [facility]. We can try sumatriptan for abortive therapy. Please take 1 tablet at onset of headache. May take 1 additional tablet in 2 hours if needed. Do not take more than 2 tablets in 24 hours or more than 10 in a month.    Please make sure you are staying well hydrated. I recommend 50-60 ounces daily. Well balanced diet and regular exercise encouraged. Consistent sleep schedule with 6-8 hours recommended.   Please continue follow up with care team as directed.   Follow up in 3 months   You may receive a survey regarding today's visit. I encourage you to leave honest feed back as I do use this information to improve patient care. Thank you for seeing me today!    Sumatriptan tablets What is this medicine? SUMATRIPTAN (soo ma TRIP tan) is used to treat migraines with or without aura. An aura is a strange feeling or visual disturbance that warns you of an attack. It is not used to prevent migraines. This medicine may be used for other purposes; ask your health care provider or pharmacist if you have questions. COMMON BRAND NAME(S): Imitrex, Migraine Pack What should I tell my health care provider before I take this medicine? They need to know if you have any of these conditions:  cigarette smoker  circulation problems in fingers and toes  diabetes  heart disease  high blood pressure  high cholesterol  history of irregular heartbeat  history of stroke  kidney disease  liver disease  stomach or intestine problems  an unusual or allergic reaction to sumatriptan, other medicines, foods, dyes, or preservatives  pregnant or trying to get pregnant  breast-feeding How should I use this medicine? Take this medicine by mouth with a glass of water. Follow the directions on the prescription  label. Do not take it more often than directed. Talk to your pediatrician regarding the use of this medicine in children. Special care may be needed. Overdosage: If you think you have taken too much of this medicine contact a poison control center or emergency room at once. NOTE: This medicine is only for you. Do not share this medicine with others. What if I miss a dose? This does not apply. This medicine is not for regular use. What may interact with this medicine? Do not take this medicine with any of the following medicines:  certain medicines for migraine headache like almotriptan, eletriptan, frovatriptan, naratriptan, rizatriptan, sumatriptan, zolmitriptan  ergot alkaloids like dihydroergotamine, ergonovine, ergotamine, methylergonovine  MAOIs like Carbex, Eldepryl, Marplan, Nardil, and Parnate This medicine may also interact with the following medications:  certain medicines for depression, anxiety, or psychotic disorders This list may not describe all possible interactions. Give your health care provider a list of all the medicines, herbs, non-prescription drugs, or dietary supplements you use. Also tell them if you smoke, drink alcohol, or use illegal drugs. Some items may interact with your medicine. What should I watch for while using this medicine? Visit your healthcare professional for regular checks on your progress. Tell your healthcare professional if your symptoms do not start to get better or if they get worse. You may get drowsy or dizzy. Do not drive, use machinery, or do anything that needs mental alertness until you know how this medicine affects you. Do not stand up  or sit up quickly, especially if you are an older patient. This reduces the risk of dizzy or fainting spells. Alcohol may interfere with the effect of this medicine. Tell your healthcare professional right away if you have any change in your eyesight. If you take migraine medicines for 10 or more days a  month, your migraines may get worse. Keep a diary of headache days and medicine use. Contact your healthcare professional if your migraine attacks occur more frequently. What side effects may I notice from receiving this medicine? Side effects that you should report to your doctor or health care professional as soon as possible:  allergic reactions like skin rash, itching or hives, swelling of the face, lips, or tongue  changes in vision  chest pain or chest tightness  signs and symptoms of a dangerous change in heartbeat or heart rhythm like chest pain; dizziness; fast, irregular heartbeat; palpitations; feeling faint or lightheaded; falls; breathing problems  signs and symptoms of a stroke like changes in vision; confusion; trouble speaking or understanding; severe headaches; sudden numbness or weakness of the face, arm or leg; trouble walking; dizziness; loss of balance or coordination  signs and symptoms of serotonin syndrome like irritable; confusion; diarrhea; fast or irregular heartbeat; muscle twitching; stiff muscles; trouble walking; sweating; high fever; seizures; chills; vomiting Side effects that usually do not require medical attention (report to your doctor or health care professional if they continue or are bothersome):  diarrhea  dizziness  drowsiness  dry mouth  headache  nausea, vomiting  pain, tingling, numbness in the hands or feet  stomach pain This list may not describe all possible side effects. Call your doctor for medical advice about side effects. You may report side effects to FDA at 1-800-FDA-1088. Where should I keep my medicine? Keep out of the reach of children. Store at room temperature between 2 and 30 degrees C (36 and 86 degrees F). Throw away any unused medicine after the expiration date. NOTE: This sheet is a summary. It may not cover all possible information. If you have questions about this medicine, talk to your doctor, pharmacist, or  health care provider.  2021 Elsevier/Gold Standard (2017-11-24 15:05:37)   Migraine Headache A migraine headache is a very strong throbbing pain on one side or both sides of your head. This type of headache can also cause other symptoms. It can last from 4 hours to 3 days. Talk with your doctor about what things may bring on (trigger) this condition. What are the causes? The exact cause of this condition is not known. This condition may be triggered or caused by:  Drinking alcohol.  Smoking.  Taking medicines, such as: ? Medicine used to treat chest pain (nitroglycerin). ? Birth control pills. ? Estrogen. ? Some blood pressure medicines.  Eating or drinking certain products.  Doing physical activity. Other things that may trigger a migraine headache include:  Having a menstrual period.  Pregnancy.  Hunger.  Stress.  Not getting enough sleep or getting too much sleep.  Weather changes.  Tiredness (fatigue). What increases the risk?  Being 42-37 years old.  Being female.  Having a family history of migraine headaches.  Being Caucasian.  Having depression or anxiety.  Being very overweight. What are the signs or symptoms?  A throbbing pain. This pain may: ? Happen in any area of the head, such as on one side or both sides. ? Make it hard to do daily activities. ? Get worse with physical activity. ? Get  worse around bright lights or loud noises.  Other symptoms may include: ? Feeling sick to your stomach (nauseous). ? Vomiting. ? Dizziness. ? Being sensitive to bright lights, loud noises, or smells.  Before you get a migraine headache, you may get warning signs (an aura). An aura may include: ? Seeing flashing lights or having blind spots. ? Seeing bright spots, halos, or zigzag lines. ? Having tunnel vision or blurred vision. ? Having numbness or a tingling feeling. ? Having trouble talking. ? Having weak muscles.  Some people have symptoms after  a migraine headache (postdromal phase), such as: ? Tiredness. ? Trouble thinking (concentrating). How is this treated?  Taking medicines that: ? Relieve pain. ? Relieve the feeling of being sick to your stomach. ? Prevent migraine headaches.  Treatment may also include: ? Having acupuncture. ? Avoiding foods that bring on migraine headaches. ? Learning ways to control your body functions (biofeedback). ? Therapy to help you know and deal with negative thoughts (cognitive behavioral therapy). Follow these instructions at home: Medicines  Take over-the-counter and prescription medicines only as told by your doctor.  Ask your doctor if the medicine prescribed to you: ? Requires you to avoid driving or using heavy machinery. ? Can cause trouble pooping (constipation). You may need to take these steps to prevent or treat trouble pooping:  Drink enough fluid to keep your pee (urine) pale yellow.  Take over-the-counter or prescription medicines.  Eat foods that are high in fiber. These include beans, whole grains, and fresh fruits and vegetables.  Limit foods that are high in fat and sugar. These include fried or sweet foods. Lifestyle  Do not drink alcohol.  Do not use any products that contain nicotine or tobacco, such as cigarettes, e-cigarettes, and chewing tobacco. If you need help quitting, ask your doctor.  Get at least 8 hours of sleep every night.  Limit and deal with stress. General instructions  Keep a journal to find out what may bring on your migraine headaches. For example, write down: ? What you eat and drink. ? How much sleep you get. ? Any change in what you eat or drink. ? Any change in your medicines.  If you have a migraine headache: ? Avoid things that make your symptoms worse, such as bright lights. ? It may help to lie down in a dark, quiet room. ? Do not drive or use heavy machinery. ? Ask your doctor what activities are safe for you.  Keep all  follow-up visits as told by your doctor. This is important.      Contact a doctor if:  You get a migraine headache that is different or worse than others you have had.  You have more than 15 headache days in one month. Get help right away if:  Your migraine headache gets very bad.  Your migraine headache lasts longer than 72 hours.  You have a fever.  You have a stiff neck.  You have trouble seeing.  Your muscles feel weak or like you cannot control them.  You start to lose your balance a lot.  You start to have trouble walking.  You pass out (faint).  You have a seizure. Summary  A migraine headache is a very strong throbbing pain on one side or both sides of your head. These headaches can also cause other symptoms.  This condition may be treated with medicines and changes to your lifestyle.  Keep a journal to find out what may bring on  your migraine headaches.  Contact a doctor if you get a migraine headache that is different or worse than others you have had.  Contact your doctor if you have more than 15 headache days in a month. This information is not intended to replace advice given to you by your health care provider. Make sure you discuss any questions you have with your health care provider. Document Revised: 09/03/2018 Document Reviewed: 06/24/2018 Elsevier Patient Education  2021 [employer].

## 2020-09-13 ENCOUNTER — Other Ambulatory Visit: Payer: Self-pay

## 2020-09-14 ENCOUNTER — Other Ambulatory Visit: Payer: Self-pay

## 2020-09-14 MED FILL — Etonogestrel-Ethinyl Estradiol VA Ring 0.12-0.015 MG/24HR: VAGINAL | 63 days supply | Qty: 3 | Fill #0 | Status: CN

## 2020-09-17 ENCOUNTER — Telehealth: Payer: Self-pay | Admitting: *Deleted

## 2020-09-17 NOTE — Telephone Encounter (Signed)
Received Qulipta enrollment form.  Completed and did PA approval thru [employer].  REF # [id number] good 09-14-2020 to 03-15-2021. Fax confirmation received [phone], [phone].

## 2020-09-18 ENCOUNTER — Other Ambulatory Visit: Payer: Self-pay

## 2020-09-18 NOTE — Telephone Encounter (Signed)
I called pt let her know that qulipta was approved (PA).  Copay card available is needed.  I called [facility] pharmacy and prescription did go thru $0 cost.  I faxed copay voucher from [place] if needed.

## 2020-10-16 ENCOUNTER — Other Ambulatory Visit: Payer: Self-pay

## 2020-10-16 MED FILL — Etonogestrel-Ethinyl Estradiol VA Ring 0.12-0.015 MG/24HR: VAGINAL | 84 days supply | Qty: 3 | Fill #0 | Status: AC

## 2020-10-16 NOTE — Progress Notes (Signed)
I reviewed note and agree with plan.   Nam Vossler R. Melecio Cueto, MD 10/16/2020, 9:23 PM Certified in Neurology, Neurophysiology and Neuroimaging  [facility] [address], [address] [place], [place] [address] [phone]  

## 2020-10-17 ENCOUNTER — Other Ambulatory Visit: Payer: Self-pay

## 2020-11-22 ENCOUNTER — Other Ambulatory Visit: Payer: Self-pay

## 2020-11-22 MED FILL — Etonogestrel-Ethinyl Estradiol VA Ring 0.12-0.015 MG/24HR: VAGINAL | 84 days supply | Qty: 3 | Fill #1 | Status: CN

## 2020-11-22 MED FILL — Topiramate Tab 100 MG: ORAL | 90 days supply | Qty: 180 | Fill #1 | Status: AC

## 2020-12-03 ENCOUNTER — Other Ambulatory Visit: Payer: Self-pay

## 2020-12-03 MED FILL — Etonogestrel-Ethinyl Estradiol VA Ring 0.12-0.015 MG/24HR: VAGINAL | 84 days supply | Qty: 3 | Fill #1 | Status: CN

## 2020-12-10 ENCOUNTER — Other Ambulatory Visit: Payer: Self-pay

## 2020-12-10 MED FILL — Etonogestrel-Ethinyl Estradiol VA Ring 0.12-0.015 MG/24HR: VAGINAL | 63 days supply | Qty: 3 | Fill #1 | Status: CN

## 2020-12-11 ENCOUNTER — Other Ambulatory Visit: Payer: Self-pay

## 2020-12-13 ENCOUNTER — Ambulatory Visit: Payer: No Typology Code available for payment source | Admitting: Family Medicine

## 2020-12-13 ENCOUNTER — Encounter: Payer: Self-pay | Admitting: Family Medicine

## 2020-12-13 NOTE — Progress Notes (Deleted)
PATIENT: Christy Whitehead DOB: November 05, 1985  REASON FOR VISIT: follow up HISTORY FROM: patient  No chief complaint on file.    HISTORY OF PRESENT ILLNESS: 12/13/20 ALL: We started Qulipta at last visit. She continued topirmate and we switched abortive therapy to sumatriptan.   09/11/2020 ALL:  She returns for migraine follow up. She continues topiramate 100mg  BID. She was switched from Amovig to [facility] due to local reaction but reports reaction was worse with Emgality. She stopped CGRP about a year ago. Migraines have worsened over past year. Now having daily headaches. She has about 4 bad migraines per month. Some last several days. Usually takes Tylenol or ibuprofen. She takes rizatriptan about 2-3 times a month. It does not usually help.   09/08/2019 ALL:  Christy Whitehead is a 35 y.o. female here today for follow up for migraines. She continues topiramate 100mg  BID and Emgality monthly. Rizatriptan continued for abortive therapy. She continues to have about 10-12 times a month. 4-5 are migrainous. Rizatriptan helps some. She also takes Tylenol, Excedrin or Ibuprofen a couple of times a week. Intensity of headaches is significantly improved. Previously having 10/10 pain with migraines, now maybe 2-3/10. She switched to Nuvaring and feels that this has helped. She is an . She works nights.    HISTORY: (copied from Dr 02-02-2001 note on 05/10/2019)  UPDATE (05/10/19, VRP): Since last visit, was doing well for a few months, with aimovig, then in last 2 months slightly worse. Now with daily tension headaches. Also 1-2 severe migraine per month. Symptoms are increased. Stress, N95 masks, may be aggravating factors. Tried adjusting birth control hormones with ob/gyn, but did not help. Some injection site reaction with aimovig. TPX and rizatriptan working.     PRIOR HPI: 35 year old female here for evaluation migraine headaches.   Patient has had migraine headaches  since teenage years.  She describes frontal, sometimes unilateral, throbbing severe headaches associated with phonophobia and nausea.  No visual aura.  Patient also has some lower back pain daily headaches.  Patient has previously tried topiramate, Tofranil and imipramine.  She has tried trigger point injections with mild relief.  She has tried sumatriptan and Fioricet with mild relief.  She is also used Tylenol, ibuprofen and Robaxin.   Patient has approximately 1 week of severe headaches per month in addition to daily headaches.  No specific triggering factors.  Patient has tried to improve her nutrition, sleep and caffeine intake.  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  REVIEW OF SYSTEMS: Out of a complete 14 system review of symptoms, the patient complains only of the following symptoms, headaches and all other reviewed systems are negative.  ALLERGIES: Allergies  Allergen Reactions   Aimovig [Erenumab-Aooe] Hives    Site reaction   Emgality [Galcanezumab-Gnlm] Hives    Site reaction    HOME MEDICATIONS: Outpatient Medications Prior to Visit  Medication Sig Dispense Refill   acetaminophen (TYLENOL) 500 MG tablet Take 500 mg by mouth every 6 (six) hours as needed.     Aspirin-Acetaminophen-Caffeine (EXCEDRIN PO) Take by mouth as needed.     Atogepant (QULIPTA) 60 MG TABS Take 1 tablet (60 mg) by mouth daily. 90 tablet 3   etonogestrel-ethinyl estradiol (NUVARING) 0.12-0.015 MG/24HR vaginal ring INSERT VAGINALLY AND LEAVE IN PLACE FOR 3 CONSECUTIVE WEEKS, THEN REPLACE FOR CONTINUOUS DOSING DUE TO HEADACHES (Patient taking differently: INSERT VAGINALLY AND LEAVE IN PLACE FOR 3 CONSECUTIVE WEEKS, THEN REPLACE FOR CONTINUOUS DOSING DUE  TO HEADACHES) 3 each 4   etonogestrel-ethinyl estradiol (NUVARING) 0.12-0.015 MG/24HR vaginal ring INSERT VAGINALLY AND LEAVE IN PLACE FOR 3 CONSECUTIVE WEEKS, THEN REPLACE FOR CONTINUOUS DOSING DUE TO HEADACHES (Patient taking differently:  INSERT VAGINALLY AND LEAVE IN PLACE FOR 3 CONSECUTIVE WEEKS, THEN REPLACE FOR CONTINUOUS DOSING DUE TO HEADACHES) 3 each 3   ondansetron (ZOFRAN-ODT) 4 MG disintegrating tablet Take 1 tablet (4 mg total) by mouth every 8 (eight) hours as needed for nausea or vomiting. 10 tablet 3   SF 5000 PLUS 1.1 % CREA dental cream Take by mouth.     SUMAtriptan (IMITREX) 100 MG tablet Take 1 tablet (100 mg total) by mouth once as needed for up to 1 dose for migraine. May repeat in 2 hours if headache persists or recurs. 10 tablet 2   topiramate (TOPAMAX) 100 MG tablet TAKE 1 TABLET BY MOUTH TWICE DAILY 180 tablet 4   No facility-administered medications prior to visit.    PAST MEDICAL HISTORY: Past Medical History:  Diagnosis Date   Abnormal Pap smear of cervix 12/19/2008, 02/20/2009   ASCUS   Abnormal Pap smear of cervix 08/06/2010, 10/01/2010   HGSIL   Cervical dysplasia CIN1 06/05/2004   CIN III (cervical intraepithelial neoplasia grade III) with severe dysplasia 12/20/2010   LEEP Ecto Cervix   Dysmenorrhea    Migraines     PAST SURGICAL HISTORY: Past Surgical History:  Procedure Laterality Date   CHOLECYSTECTOMY  2015   COLPOSCOPY  06/05/2004,02/20/2009,10/01/2010   LEEP  12/20/2010   ECC &Endo Cx Negative, Ecto CIN 2-3    FAMILY HISTORY: Family History  Problem Relation Age of Onset   COPD Mother    Diabetes Mother    Diabetes Father    Hypertension Father    Bipolar disorder Father    Breast cancer Paternal Grandmother 58   Diabetes Sister    Obesity Sister        morbid   Mental retardation Sister    Healthy Daughter    Suicidality Paternal Grandfather     SOCIAL HISTORY: Social History   Socioeconomic History   Marital status: Single    Spouse name: Not on file   Number of children: 1   Years of education: College   Highest education level: Bachelor's degree (e.g., BA, AB, BS)  Occupational History    Comment: RN in ED, night shift  Tobacco Use   Smoking status: Former     Packs/day: 1.00    Years: 13.00    Pack years: 13.00    Types: Cigarettes    Quit date: 2018    Years since quitting: 4.5   Smokeless tobacco: Never  Vaping Use   Vaping Use: Never used  Substance and Sexual Activity   Alcohol use: Yes    Comment: once every 2 weeks, occasionally >4   Drug use: No   Sexual activity: Not Currently    Birth control/protection: Inserts    Comment: nuvaring  Other Topics Concern   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[employer]   One Daughter - Christy Whitehead - 19 years old now   Family support: dad is nearby and watches daughter   Enjoys: works a lot, spend time with friends, hiking, corn-hole, beach   Exercise: gym regularly - 1-2 since the gym closed   Diet: pretty healthy overall   Social Determinants of Corporate investment banker Strain: Not on [employer]  Insecurity: Not on file  Transportation Needs: Not on file  Physical Activity: Not on file  Stress: Not on file  Social Connections: Not on file  Intimate Partner Violence: Not on file      PHYSICAL EXAM  There were no vitals filed for this visit.  There is no height or weight on file to calculate BMI.  Generalized: Well developed, in no acute distress  Cardiology: normal rate and rhythm, no murmur noted Respiratory: clear to auscultation bilaterally  Neurological examination  Mentation: Alert oriented to time, place, history taking. Follows all commands speech and language fluent Cranial nerve II-XII: Pupils were equal round reactive to light. Extraocular movements were full, visual field were full on confrontational test. Facial sensation and strength were normal. Head turning and shoulder shrug  were normal and symmetric. Motor: The motor testing reveals 5 over 5 strength of all 4 extremities. Good symmetric motor tone is noted throughout.  Gait and station: Gait is normal.     DIAGNOSTIC DATA (LABS, IMAGING, TESTING) - I reviewed patient  records, labs, notes, testing and imaging myself where available.  No flowsheet data found.   No results found for: WBC, HGB, HCT, MCV, PLT No results found for: NA, K, CL, CO2, GLUCOSE, BUN, CREATININE, CALCIUM, PROT, ALBUMIN, AST, ALT, ALKPHOS, BILITOT, GFRNONAA, GFRAA No results found for: CHOL, HDL, LDLCALC, LDLDIRECT, TRIG, CHOLHDL No results found for: HQIO9G No results found for: VITAMINB12 No results found for: TSH     ASSESSMENT AND PLAN 35 y.o. year old female  has a past medical history of Abnormal Pap smear of cervix (12/19/2008, 02/20/2009), Abnormal Pap smear of cervix (08/06/2010, 10/01/2010), Cervical dysplasia CIN1 (06/05/2004), CIN III (cervical intraepithelial neoplasia grade III) with severe dysplasia (12/20/2010), Dysmenorrhea, and Migraines. here with     ICD-10-CM   1. Migraine without aura and without status migrainosus, not intractable  G43.009         Tinisha has had worsening headaches/migraines over the past year. She has not been able to tolerate Emgality due to local skin reaction. We will continue topiramate 100mg  twice daily. I will add Qulipta 60mg  daily. We will switch abortive therapy to sumatriptan as needed. She may use OTC analgesics but was advised against regular use to avoid concerns of rebound headaches. Adequate hydration, well balanced diet and regular exercise advised. She will follow up in 3 months, sooner if needed. She verbalizes understanding and agreement with this plan.    No orders of the defined types were placed in this encounter.    No orders of the defined types were placed in this encounter.     , FNP-C 12/13/2020, 7:24 AM [facility] [address], [address] [place], [address] [place] [phone]

## 2020-12-17 ENCOUNTER — Other Ambulatory Visit: Payer: Self-pay

## 2020-12-17 MED FILL — Etonogestrel-Ethinyl Estradiol VA Ring 0.12-0.015 MG/24HR: VAGINAL | 84 days supply | Qty: 3 | Fill #1 | Status: CN

## 2020-12-18 ENCOUNTER — Other Ambulatory Visit: Payer: Self-pay

## 2020-12-21 ENCOUNTER — Other Ambulatory Visit: Payer: Self-pay

## 2020-12-21 MED FILL — Etonogestrel-Ethinyl Estradiol VA Ring 0.12-0.015 MG/24HR: VAGINAL | 84 days supply | Qty: 3 | Fill #1 | Status: CN

## 2020-12-21 MED FILL — Etonogestrel-Ethinyl Estradiol VA Ring 0.12-0.015 MG/24HR: VAGINAL | 63 days supply | Qty: 3 | Fill #1 | Status: AC

## 2020-12-28 ENCOUNTER — Other Ambulatory Visit: Payer: Self-pay

## 2021-03-05 ENCOUNTER — Other Ambulatory Visit: Payer: Self-pay

## 2021-03-05 MED FILL — Topiramate Tab 100 MG: ORAL | 90 days supply | Qty: 180 | Fill #2 | Status: AC

## 2021-03-06 ENCOUNTER — Other Ambulatory Visit: Payer: Self-pay

## 2021-04-03 ENCOUNTER — Other Ambulatory Visit: Payer: Self-pay

## 2021-04-03 MED FILL — Etonogestrel-Ethinyl Estradiol VA Ring 0.12-0.015 MG/24HR: VAGINAL | 63 days supply | Qty: 3 | Fill #2 | Status: AC

## 2021-06-06 ENCOUNTER — Other Ambulatory Visit: Payer: Self-pay

## 2021-06-06 ENCOUNTER — Telehealth: Payer: Self-pay | Admitting: *Deleted

## 2021-06-06 MED ORDER — TOPIRAMATE 100 MG PO TABS
100.0000 mg | ORAL_TABLET | Freq: Two times a day (BID) | ORAL | 0 refills | Status: DC
  Filled 2021-06-06: qty 60, 30d supply, fill #0

## 2021-06-06 MED FILL — Etonogestrel-Ethinyl Estradiol VA Ring 0.12-0.015 MG/24HR: VAGINAL | 63 days supply | Qty: 3 | Fill #3 | Status: AC

## 2021-06-06 NOTE — Telephone Encounter (Signed)
Submitted PA Qulipta on CMM. Key: [id number]. Waiting on determination from [employer].

## 2021-06-06 NOTE — Telephone Encounter (Signed)
The request has been approved. The authorization is effective for a maximum of 12 fills from 06/06/2021 to 06/05/2022, as long as the member is enrolled in their current health plan. The request was approved with a quantity restriction. This has been approved for a max daily dosage of 1. A written notification letter will follow with additional details.

## 2021-06-14 ENCOUNTER — Other Ambulatory Visit: Payer: Self-pay

## 2021-06-18 ENCOUNTER — Other Ambulatory Visit: Payer: Self-pay

## 2021-06-18 ENCOUNTER — Ambulatory Visit (INDEPENDENT_AMBULATORY_CARE_PROVIDER_SITE_OTHER): Payer: No Typology Code available for payment source | Admitting: Obstetrics and Gynecology

## 2021-06-18 ENCOUNTER — Encounter: Payer: Self-pay | Admitting: Obstetrics and Gynecology

## 2021-06-18 VITALS — BP 100/70 | Ht 64.0 in | Wt 165.0 lb

## 2021-06-18 DIAGNOSIS — B3731 Acute candidiasis of vulva and vagina: Secondary | ICD-10-CM

## 2021-06-18 LAB — POCT WET PREP WITH KOH
Clue Cells Wet Prep HPF POC: NEGATIVE
KOH Prep POC: NEGATIVE
Trichomonas, UA: NEGATIVE
Yeast Wet Prep HPF POC: POSITIVE

## 2021-06-18 MED ORDER — FLUCONAZOLE 150 MG PO TABS
150.0000 mg | ORAL_TABLET | Freq: Once | ORAL | 0 refills | Status: AC
  Filled 2021-06-18: qty 1, 1d supply, fill #0

## 2021-06-18 NOTE — Progress Notes (Signed)
Christy Whitehead, Christy R, MD   Chief Complaint  Patient presents with   Vaginal Discharge    Itchiness, sour odor x 2 weeks    HPI:      [name] is a 36 y.o. G1P1001 whose LMP was No LMP recorded. (Menstrual status: Other)., presents today for increased vag d/c with irritation/itching, no fishy odor for 2 wks. Sx started after changing soap. No urin sx, no pelvic pain/fevers. No recent abx use. No meds to treat. She is not sex active.   Patient Active Problem List   Diagnosis Date Noted   History of cervical dysplasia 02/02/2019   Migraines     Past Surgical History:  Procedure Laterality Date   CHOLECYSTECTOMY  2015   COLPOSCOPY  06/05/2004,02/20/2009,10/01/2010   LEEP  12/20/2010   ECC &Endo Cx Negative, Ecto CIN 2-3    Family History  Problem Relation Age of Onset   COPD Mother    Diabetes Mother    Diabetes Father    Hypertension Father    Bipolar disorder Father    Breast cancer Paternal Grandmother [id number]   Diabetes Sister    Obesity Sister        morbid   Mental retardation Sister    Healthy Daughter    Suicidality Paternal Grandfather     Social History   Socioeconomic History   Marital status: Single    Spouse name: Not on file   Number of children: 1   Years of education: College   Highest education level: Bachelor's degree (e.g., BA, AB, BS)  Occupational History    Comment: RN in ED, night shift  Tobacco Use   Smoking status: Former    Packs/day: 1.00    Years: 13.00    Pack years: 13.00    Types: Cigarettes    Quit date: 2018    Years since quitting: 5.0   Smokeless tobacco: Never  Vaping Use   Vaping Use: Never used  Substance and Sexual Activity   Alcohol use: Yes    Comment: once every 2 weeks, occasionally >4   Drug use: No   Sexual activity: Not Currently    Birth control/protection: Inserts    Comment: nuvaring  Other Topics Concern   Not on file  Social History Narrative   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  [employer]   One Daughter - Sherol Dadeeveah - 36 years old now   Family support: dad is nearby and watches daughter   Enjoys: works a lot, spend time with friends, hiking, corn-hole, beach   Exercise: gym regularly - 1-2 since the gym closed   Diet: pretty healthy overall   Social Determinants of Corporate investment bankerHealth   Financial Resource Strain: Not on file  Food Insecurity: Not on file  Transportation Needs: Not on file  Physical Activity: Not on file  Stress: Not on file  Social Connections: Not on file  Intimate Partner Violence: Not on file    Outpatient Medications Prior to Visit  Medication Sig Dispense Refill   acetaminophen (TYLENOL) 500 MG tablet Take 500 mg by mouth every 6 (six) hours as needed.     Aspirin-Acetaminophen-Caffeine (EXCEDRIN PO) Take by mouth as needed.     Atogepant (QULIPTA) 60 MG TABS Take 1 tablet (60 mg) by mouth daily. 90 tablet 3   etonogestrel-ethinyl estradiol (NUVARING) 0.12-0.015 MG/24HR vaginal ring INSERT VAGINALLY AND LEAVE IN PLACE FOR 3 CONSECUTIVE WEEKS, THEN REPLACE FOR CONTINUOUS DOSING DUE TO HEADACHES (Patient  taking differently: INSERT VAGINALLY AND LEAVE IN PLACE FOR 3 CONSECUTIVE WEEKS, THEN REPLACE FOR CONTINUOUS DOSING DUE TO HEADACHES) 3 each 4   ondansetron (ZOFRAN-ODT) 4 MG disintegrating tablet Take 1 tablet (4 mg total) by mouth every 8 (eight) hours as needed for nausea or vomiting. 10 tablet 3   SF 5000 PLUS 1.1 % CREA dental cream Take by mouth.     SUMAtriptan (IMITREX) 100 MG tablet Take 1 tablet (100 mg total) by mouth once as needed for up to 1 dose for migraine. May repeat in 2 hours if headache persists or recurs. 10 tablet 2   topiramate (TOPAMAX) 100 MG tablet Take 1 tablet (100 mg total) by mouth 2 (two) times daily. (Pt is overdue for f/u visit. Please remind pt an appt is needed for continued refills. 1st attempt.) 60 tablet 0   etonogestrel-ethinyl estradiol (NUVARING) 0.12-0.015 MG/24HR vaginal ring INSERT VAGINALLY AND LEAVE IN PLACE FOR 3  CONSECUTIVE WEEKS, THEN REPLACE FOR CONTINUOUS DOSING DUE TO HEADACHES (Patient taking differently: INSERT VAGINALLY AND LEAVE IN PLACE FOR 3 CONSECUTIVE WEEKS, THEN REPLACE FOR CONTINUOUS DOSING DUE TO HEADACHES) 3 each 3   No facility-administered medications prior to visit.      ROS:  Review of Systems  Constitutional:  Negative for fever.  Gastrointestinal:  Negative for blood in stool, constipation, diarrhea, nausea and vomiting.  Genitourinary:  Positive for vaginal discharge. Negative for dyspareunia, dysuria, flank pain, frequency, hematuria, urgency, vaginal bleeding and vaginal pain.  Musculoskeletal:  Negative for back pain.  Skin:  Negative for rash.  BREAST: No symptoms   OBJECTIVE:   Vitals:  BP 100/70    Ht 5\' 4"  (1.626 m)    Wt 165 lb (74.8 kg)    BMI 28.32 kg/m   Physical Exam Vitals reviewed.  Constitutional:      Appearance: She is well-developed.  Pulmonary:     Effort: Pulmonary effort is normal.  Genitourinary:    General: Normal vulva.     Pubic Area: No rash.      Labia:        Right: No rash, tenderness or lesion.        Left: No rash, tenderness or lesion.      Vagina: Vaginal discharge present. No erythema or tenderness.     Cervix: Normal.     Uterus: Normal. Not enlarged and not tender.      Adnexa: Right adnexa normal and left adnexa normal.       Right: No mass or tenderness.         Left: No mass or tenderness.    Musculoskeletal:        General: Normal range of motion.     Cervical back: Normal range of motion.  Skin:    General: Skin is warm and dry.  Neurological:     General: No focal deficit present.     Mental Status: She is alert and oriented to person, place, and time.  Psychiatric:        Mood and Affect: Mood normal.        Behavior: Behavior normal.        Thought Content: Thought content normal.        Judgment: Judgment normal.    Results: Results for orders placed or performed in visit on 06/18/21 (from the past  24 hour(s))  POCT Wet Prep with KOH     Status: Abnormal   Collection Time: 06/18/21  3:52 PM  Result Value Ref  Range   Trichomonas, UA Negative    Clue Cells Wet Prep HPF POC neg    Epithelial Wet Prep HPF POC     Yeast Wet Prep HPF POC pos    Bacteria Wet Prep HPF POC     RBC Wet Prep HPF POC     WBC Wet Prep HPF POC     KOH Prep POC Negative Negative     Assessment/Plan: Candidal vaginitis - Plan: fluconazole (DIFLUCAN) 150 MG tablet, POCT Wet Prep with KOH; pos sx and wet prep. Rx diflucan. F/u prn. Dove sens skin soap.    Meds ordered this encounter  Medications   fluconazole (DIFLUCAN) 150 MG tablet    Sig: Take 1 tablet (150 mg total) by mouth once for 1 dose.    Dispense:  1 tablet    Refill:  0    Order Specific Question:   Supervising Provider    Answer:   Gae Dry [id number]      Return if symptoms worsen or fail to improve.  Tayvia Faughnan B. Shelma Eiben, PA-C 06/18/2021 3:53 PM

## 2021-07-09 ENCOUNTER — Other Ambulatory Visit (HOSPITAL_COMMUNITY)
Admission: RE | Admit: 2021-07-09 | Discharge: 2021-07-09 | Disposition: A | Discharge status: Home / Self Care | Payer: No Typology Code available for payment source | Source: Ambulatory Visit | Attending: Obstetrics and Gynecology | Admitting: Obstetrics and Gynecology

## 2021-07-09 ENCOUNTER — Encounter: Payer: Self-pay | Admitting: Obstetrics and Gynecology

## 2021-07-09 ENCOUNTER — Other Ambulatory Visit: Payer: Self-pay

## 2021-07-09 ENCOUNTER — Ambulatory Visit (INDEPENDENT_AMBULATORY_CARE_PROVIDER_SITE_OTHER): Payer: No Typology Code available for payment source | Admitting: Obstetrics and Gynecology

## 2021-07-09 VITALS — BP 100/70 | Ht 64.0 in | Wt 164.0 lb

## 2021-07-09 DIAGNOSIS — Z1151 Encounter for screening for human papillomavirus (HPV): Secondary | ICD-10-CM | POA: Diagnosis not present

## 2021-07-09 DIAGNOSIS — D069 Carcinoma in situ of cervix, unspecified: Secondary | ICD-10-CM | POA: Insufficient documentation

## 2021-07-09 DIAGNOSIS — Z124 Encounter for screening for malignant neoplasm of cervix: Secondary | ICD-10-CM

## 2021-07-09 DIAGNOSIS — Z01419 Encounter for gynecological examination (general) (routine) without abnormal findings: Secondary | ICD-10-CM

## 2021-07-09 DIAGNOSIS — Z3044 Encounter for surveillance of vaginal ring hormonal contraceptive device: Secondary | ICD-10-CM

## 2021-07-09 HISTORY — DX: Carcinoma in situ of cervix, unspecified: D06.9

## 2021-07-09 MED ORDER — ETONOGESTREL-ETHINYL ESTRADIOL 0.12-0.015 MG/24HR VA RING
VAGINAL_RING | VAGINAL | 4 refills | Status: DC
  Filled 2021-07-09: qty 3, 63d supply, fill #0
  Filled 2021-10-09: qty 3, 84d supply, fill #0
  Filled 2021-12-14: qty 3, 84d supply, fill #1
  Filled 2022-04-03: qty 3, 84d supply, fill #2
  Filled 2022-05-09: qty 3, 63d supply, fill #3
  Filled 2022-05-31: qty 3, 84d supply, fill #3

## 2021-07-09 NOTE — Progress Notes (Signed)
PCP:  Lesleigh Noe, MD   Chief Complaint  Patient presents with   Gynecologic Exam    No concerns     HPI:      [name] is a 36 y.o. G1P1001 who LMP was Patient's last menstrual period was 07/05/2021 (exact date)., presents today for her annual examination.  Her menses are infrequent with cont dosing nuvaring for migraine headache prevention, lasting 3 days.  Dysmenorrhea none. She does not have intermenstrual bleeding. Hx of migraine without aura for many yrs. On Qulipta now with sx HA improvement. Did well on aimovig but had injection site rxn and couldn't continue.   Sex activity: not sex active- NuvaRing vaginal inserts.  Last Pap: 06/28/20  Results were: no abnormalities /neg HPV DNA 2019 Hx of STDs: HPV--hx of CIN3 with LEEP 2012  There is a FH of breast cancer in her PGM, genetic testing not indicated. There is no FH of ovarian cancer. The patient does not do self-breast exams.  Tobacco use: The patient denies current or previous tobacco use. Alcohol use: social drinker No drug use.  Exercise: moderately active  She does get adequate calcium but not Vitamin D in her diet.  Labs with PCP  Past Medical History:  Diagnosis Date   Abnormal Pap smear of cervix 12/19/2008, 02/20/2009   ASCUS   Abnormal Pap smear of cervix 08/06/2010, 10/01/2010   HGSIL   Cervical dysplasia CIN1 06/05/2004   CIN III (cervical intraepithelial neoplasia grade III) with severe dysplasia 12/20/2010   LEEP Ecto Cervix   Dysmenorrhea    Migraines     Past Surgical History:  Procedure Laterality Date   CHOLECYSTECTOMY  2015   COLPOSCOPY  06/05/2004,02/20/2009,10/01/2010   LEEP  12/20/2010   ECC &Endo Cx Negative, Ecto CIN 2-3    Family History  Problem Relation Age of Onset   COPD Mother    Diabetes Mother    Diabetes Father    Hypertension Father    Bipolar disorder Father    Breast cancer Paternal Grandmother [phone]   Diabetes Sister    Obesity Sister        morbid    Mental retardation Sister    Healthy Daughter    Suicidality Paternal Grandfather     Social History   Socioeconomic History   Marital status: Single    Spouse name: Not on file   Number of children: 1   Years of education: College   Highest education level: Bachelor's degree (e.g., BA, AB, BS)  Occupational History    Comment: RN in ED, night shift  Tobacco Use   Smoking status: Former    Packs/day: 1.00    Years: 13.00    Pack years: 13.00    Types: Cigarettes    Quit date: 2018    Years since quitting: 5.1   Smokeless tobacco: Never  Vaping Use   Vaping Use: Never used  Substance and Sexual Activity   Alcohol use: Yes    Comment: once every 2 weeks, occasionally >4   Drug use: No   Sexual activity: Not Currently    Birth control/protection: Inserts    Comment: nuvaring  Other Topics Concern   Not on file  Social History Narrative   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 [employer]   One Daughter - [name] - 47 years old now   Family support: dad is nearby and watches daughter   Enjoys: works a lot, spend time with friends,  hiking, corn-hole, beach   Exercise: gym regularly - 1-2 since the gym closed   Diet: pretty healthy overall   Social Determinants of Health   Financial Resource Strain: Not on file  Food Insecurity: Not on file  Transportation Needs: Not on file  Physical Activity: Not on file  Stress: Not on file  Social Connections: Not on file  Intimate Partner Violence: Not on file    Outpatient Medications Prior to Visit  Medication Sig Dispense Refill   acetaminophen (TYLENOL) 500 MG tablet Take 500 mg by mouth every 6 (six) hours as needed.     Aspirin-Acetaminophen-Caffeine (EXCEDRIN PO) Take by mouth as needed.     Atogepant (QULIPTA) 60 MG TABS Take 1 tablet (60 mg) by mouth daily. 90 tablet 3   ondansetron (ZOFRAN-ODT) 4 MG disintegrating tablet Take 1 tablet (4 mg total) by mouth every 8 (eight) hours as needed for nausea or vomiting. 10 tablet  3   SF 5000 PLUS 1.1 % CREA dental cream Take by mouth.     SUMAtriptan (IMITREX) 100 MG tablet Take 1 tablet (100 mg total) by mouth once as needed for up to 1 dose for migraine. May repeat in 2 hours if headache persists or recurs. 10 tablet 2   topiramate (TOPAMAX) 100 MG tablet Take 1 tablet (100 mg total) by mouth 2 (two) times daily. (Pt is overdue for f/u visit. Please remind pt an appt is needed for continued refills. 1st attempt.) 60 tablet 0   etonogestrel-ethinyl estradiol (NUVARING) 0.12-0.015 MG/24HR vaginal ring INSERT VAGINALLY AND LEAVE IN PLACE FOR 3 CONSECUTIVE WEEKS, THEN REPLACE FOR CONTINUOUS DOSING DUE TO HEADACHES (Patient taking differently: INSERT VAGINALLY AND LEAVE IN PLACE FOR 3 CONSECUTIVE WEEKS, THEN REPLACE FOR CONTINUOUS DOSING DUE TO HEADACHES) 3 each 4   etonogestrel-ethinyl estradiol (NUVARING) 0.12-0.015 MG/24HR vaginal ring INSERT VAGINALLY AND LEAVE IN PLACE FOR 3 CONSECUTIVE WEEKS, THEN REPLACE FOR CONTINUOUS DOSING DUE TO HEADACHES (Patient taking differently: INSERT VAGINALLY AND LEAVE IN PLACE FOR 3 CONSECUTIVE WEEKS, THEN REPLACE FOR CONTINUOUS DOSING DUE TO HEADACHES) 3 each 3   No facility-administered medications prior to visit.      ROS:  Review of Systems  Constitutional:  Negative for fatigue, fever and unexpected weight change.  Respiratory:  Negative for cough, shortness of breath and wheezing.   Cardiovascular:  Negative for chest pain, palpitations and leg swelling.  Gastrointestinal:  Negative for blood in stool, constipation, diarrhea, nausea and vomiting.  Endocrine: Negative for cold intolerance, heat intolerance and polyuria.  Genitourinary:  Negative for dyspareunia, dysuria, flank pain, frequency, genital sores, hematuria, menstrual problem, pelvic pain, urgency, vaginal bleeding, vaginal discharge and vaginal pain.  Musculoskeletal:  Negative for back pain, joint swelling and myalgias.  Skin:  Negative for rash.  Neurological:   Negative for dizziness, syncope, light-headedness, numbness and headaches.  Hematological:  Negative for adenopathy.  Psychiatric/Behavioral:  Negative for agitation, confusion, sleep disturbance and suicidal ideas. The patient is not nervous/anxious.   BREAST: No symptoms   Objective: BP 100/70    Ht 5\' 4"  (1.626 m)    Wt 164 lb (74.4 kg)    LMP 07/05/2021 (Exact Date)    BMI 28.15 kg/m    Physical Exam Constitutional:      Appearance: She is well-developed.  Genitourinary:     Vulva normal.     Right Labia: No rash, tenderness or lesions.    Left Labia: No tenderness, lesions or rash.    No vaginal discharge, erythema  or tenderness.      Right Adnexa: not tender and no mass present.    Left Adnexa: not tender and no mass present.    No cervical motion tenderness, friability or polyp.     Uterus is not enlarged or tender.  Breasts:    Right: No mass, nipple discharge, skin change or tenderness.     Left: No mass, nipple discharge, skin change or tenderness.  Neck:     Thyroid: No thyromegaly.  Cardiovascular:     Rate and Rhythm: Normal rate and regular rhythm.     Heart sounds: Normal heart sounds. No murmur heard. Pulmonary:     Effort: Pulmonary effort is normal.     Breath sounds: Normal breath sounds.  Abdominal:     Palpations: Abdomen is soft.     Tenderness: There is no abdominal tenderness. There is no guarding or rebound.  Musculoskeletal:        General: Normal range of motion.     Cervical back: Normal range of motion.  Lymphadenopathy:     Cervical: No cervical adenopathy.  Neurological:     General: No focal deficit present.     Mental Status: She is alert and oriented to person, place, and time.     Cranial Nerves: No cranial nerve deficit.  Skin:    General: Skin is warm and dry.  Psychiatric:        Mood and Affect: Mood normal.        Behavior: Behavior normal.        Thought Content: Thought content normal.        Judgment: Judgment normal.   Vitals reviewed.    Assessment/Plan: Encounter for annual routine gynecological examination  Cervical cancer screening - Plan: Cytology - PAP  Screening for HPV (human papillomavirus) - Plan: Cytology - PAP  High grade squamous intraepithelial lesion (HGSIL), grade 3 CIN, on biopsy of cervix - Plan: Cytology - PAP; repeat pap today  Encounter for surveillance of vaginal ring hormonal contraceptive device - Plan: etonogestrel-ethinyl estradiol (NUVARING) 0.12-0.015 MG/24HR vaginal ring; Rx RF.    Meds ordered this encounter  Medications   etonogestrel-ethinyl estradiol (NUVARING) 0.12-0.015 MG/24HR vaginal ring    Sig: INSERT VAGINALLY AND LEAVE IN PLACE FOR 3 CONSECUTIVE WEEKS, THEN REPLACE FOR CONTINUOUS DOSING DUE TO HEADACHES    Dispense:  3 each    Refill:  4    Order Specific Question:   Supervising Provider    Answer:   Gae Dry [id number]             GYN counsel adequate intake of calcium and vitamin D, diet and exercise     F/U  Return in about 1 year (around 07/09/2022).  Brayten Komar B. Damyah Gugel, PA-C 07/09/2021 4:40 PM

## 2021-07-09 NOTE — Patient Instructions (Signed)
I value your feedback and you entrusting us with your care. If you get a [facility] patient survey, I would appreciate you taking the time to let us know about your experience today. Thank you! ? ? ?

## 2021-07-11 LAB — CYTOLOGY - PAP
Comment: NEGATIVE
Diagnosis: NEGATIVE
High risk HPV: NEGATIVE

## 2021-07-15 ENCOUNTER — Other Ambulatory Visit: Payer: Self-pay

## 2021-07-15 MED ORDER — TOPIRAMATE 100 MG PO TABS
100.0000 mg | ORAL_TABLET | Freq: Two times a day (BID) | ORAL | 0 refills | Status: DC
Start: 2021-07-15 — End: 2021-10-23
  Filled 2021-07-15: qty 180, 90d supply, fill #0

## 2021-08-01 ENCOUNTER — Ambulatory Visit: Payer: No Typology Code available for payment source | Admitting: Family Medicine

## 2021-09-03 ENCOUNTER — Other Ambulatory Visit: Payer: Self-pay

## 2021-09-03 ENCOUNTER — Other Ambulatory Visit: Payer: Self-pay | Admitting: Family Medicine

## 2021-09-03 ENCOUNTER — Other Ambulatory Visit (INDEPENDENT_AMBULATORY_CARE_PROVIDER_SITE_OTHER): Payer: No Typology Code available for payment source

## 2021-09-03 ENCOUNTER — Ambulatory Visit (INDEPENDENT_AMBULATORY_CARE_PROVIDER_SITE_OTHER): Payer: No Typology Code available for payment source | Admitting: Family Medicine

## 2021-09-03 VITALS — BP 102/64 | HR 52 | Temp 98.1°F | Ht 64.0 in | Wt 166.1 lb

## 2021-09-03 DIAGNOSIS — N920 Excessive and frequent menstruation with regular cycle: Secondary | ICD-10-CM | POA: Diagnosis not present

## 2021-09-03 DIAGNOSIS — Z13220 Encounter for screening for lipoid disorders: Secondary | ICD-10-CM | POA: Diagnosis not present

## 2021-09-03 DIAGNOSIS — F321 Major depressive disorder, single episode, moderate: Secondary | ICD-10-CM | POA: Insufficient documentation

## 2021-09-03 DIAGNOSIS — G43709 Chronic migraine without aura, not intractable, without status migrainosus: Secondary | ICD-10-CM | POA: Diagnosis not present

## 2021-09-03 DIAGNOSIS — E559 Vitamin D deficiency, unspecified: Secondary | ICD-10-CM

## 2021-09-03 DIAGNOSIS — F411 Generalized anxiety disorder: Secondary | ICD-10-CM | POA: Insufficient documentation

## 2021-09-03 DIAGNOSIS — R7989 Other specified abnormal findings of blood chemistry: Secondary | ICD-10-CM

## 2021-09-03 LAB — COMPREHENSIVE METABOLIC PANEL
ALT: 18 U/L (ref 0–35)
AST: 15 U/L (ref 0–37)
Albumin: 3.8 g/dL (ref 3.5–5.2)
Alkaline Phosphatase: 42 U/L (ref 39–117)
BUN: 13 mg/dL (ref 6–23)
CO2: 23 mEq/L (ref 19–32)
Calcium: 9.3 mg/dL (ref 8.4–10.5)
Chloride: 106 mEq/L (ref 96–112)
Creatinine, Ser: 0.81 mg/dL (ref 0.40–1.20)
GFR: 94.1 mL/min (ref >60.00)
Glucose, Bld: 93 mg/dL (ref 70–99)
Potassium: 3.9 mEq/L (ref 3.5–5.1)
Sodium: 137 mEq/L (ref 135–145)
Total Bilirubin: 0.3 mg/dL (ref 0.2–1.2)
Total Protein: 6.3 g/dL (ref 6.0–8.3)

## 2021-09-03 LAB — CBC
HCT: 37.2 % (ref 36.0–46.0)
Hemoglobin: 12.4 g/dL (ref 12.0–15.0)
MCHC: 33.4 g/dL (ref 30.0–36.0)
MCV: 88.1 fl (ref 78.0–100.0)
Platelets: 272 10*3/uL (ref 150.0–400.0)
RBC: 4.23 Mil/uL (ref 3.87–5.11)
RDW: 12.5 % (ref 11.5–15.5)
WBC: 7.5 10*3/uL (ref 4.0–10.5)

## 2021-09-03 LAB — TSH: TSH: 11.93 u[IU]/mL — ABNORMAL HIGH (ref 0.35–5.50)

## 2021-09-03 LAB — LIPID PANEL
Cholesterol: 193 mg/dL (ref 0–200)
HDL: 73.8 mg/dL (ref >39.00)
LDL Cholesterol: 97 mg/dL (ref 0–99)
NonHDL: 119.34
Total CHOL/HDL Ratio: 3
Triglycerides: 110 mg/dL (ref 0.0–149.0)
VLDL: 22 mg/dL (ref 0.0–40.0)

## 2021-09-03 LAB — T4, FREE: Free T4: 0.75 ng/dL (ref 0.60–1.60)

## 2021-09-03 LAB — VITAMIN D 25 HYDROXY (VIT D DEFICIENCY, FRACTURES): VITD: 27.93 ng/mL — ABNORMAL LOW (ref 30.00–100.00)

## 2021-09-03 MED ORDER — CHOLECALCIFEROL 1.25 MG (50000 UT) PO CAPS
ORAL_CAPSULE | ORAL | 1 refills | Status: DC
  Filled 2021-09-03: qty 4, 28d supply, fill #0
  Filled 2021-10-10: qty 4, 28d supply, fill #1

## 2021-09-03 MED ORDER — ALPRAZOLAM 0.5 MG PO TABS
0.2500 mg | ORAL_TABLET | Freq: Every evening | ORAL | 0 refills | Status: DC | PRN
  Filled 2021-09-03: qty 20, 20d supply, fill #0

## 2021-09-03 MED ORDER — BUPROPION HCL ER (XL) 150 MG PO TB24
ORAL_TABLET | ORAL | 1 refills | Status: DC
  Filled 2021-09-03: qty 60, 30d supply, fill #0

## 2021-09-03 NOTE — Assessment & Plan Note (Signed)
Stable on topiramate.  Discussed Wellbutrin could worsen she will update if this happens ?

## 2021-09-03 NOTE — Progress Notes (Signed)
? ?Subjective:  ? ?  ?Christy Whitehead is a 36 y.o. female presenting for Depression (X 1 month ) and Anxiety ?  ? ? ?HPI ? ?#Depression and anxiety ?- harder time with daughter - she is 28 and giving her a harder time ?- daughter has been vaping and stole her moms alcohol, had sex, sneaking out of the house ?- all she does now with cry and argue with her daughter ?- no SI/HI ?- has therapy appointment for daughter on Friday ?- has been gaining weight - 20 lbs over the last several months ?- exercising 4 times a week  ? ?- works night shifts, but having troubling sleeping during the day ?- waking up over night - racing thoughts ? ?Thinks maybe underlying depression for 10 years - since her mom died ? ?Review of Systems ? ? ?Social History  ? ?Tobacco Use  ?Smoking Status Former  ? Packs/day: 1.00  ? Years: 13.00  ? Pack years: 13.00  ? Types: Cigarettes  ? Quit date: 2018  ? Years since quitting: 5.2  ?Smokeless Tobacco Never  ? ? ? ?   ?Objective:  ?  ?BP Readings from Last 3 Encounters:  ?09/03/21 102/64  ?07/09/21 100/70  ?06/18/21 100/70  ? ?Wt Readings from Last 3 Encounters:  ?09/03/21 166 lb 1 oz (75.3 kg)  ?07/09/21 164 lb (74.4 kg)  ?06/18/21 165 lb (74.8 kg)  ? ? ?BP 102/64   Pulse (!) 52   Temp 98.1 ?F (36.7 ?C) (Oral)   Ht 5\' 4"  (1.626 m)   Wt 166 lb 1 oz (75.3 kg)   SpO2 100%   BMI 28.50 kg/m?  ? ? ?Physical Exam ?Constitutional:   ?   General: She is not in acute distress. ?   Appearance: She is well-developed. She is not diaphoretic.  ?HENT:  ?   Right Ear: External ear normal.  ?   Left Ear: External ear normal.  ?   Nose: Nose normal.  ?Eyes:  ?   Conjunctiva/sclera: Conjunctivae normal.  ?Cardiovascular:  ?   Rate and Rhythm: Normal rate.  ?Pulmonary:  ?   Effort: Pulmonary effort is normal.  ?Musculoskeletal:  ?   Cervical back: Neck supple.  ?Skin: ?   General: Skin is warm and dry.  ?   Capillary Refill: Capillary refill takes less than 2 seconds.  ?Neurological:  ?   Mental  Status: She is alert. Mental status is at baseline.  ?Psychiatric:     ?   Mood and Affect: Mood normal.     ?   Behavior: Behavior normal.  ? ? ? ?  09/03/2021  ? 10:20 AM 09/28/2018  ?  9:21 AM  ?Depression screen PHQ 2/9  ?Decreased Interest 2 0  ?Down, Depressed, Hopeless 1 0  ?PHQ - 2 Score 3 0  ?Altered sleeping 3 1  ?Tired, decreased energy 3 1  ?Change in appetite 3 0  ?Feeling bad or failure about yourself  2 0  ?Trouble concentrating 2 0  ?Moving slowly or fidgety/restless 2 0  ?Suicidal thoughts 0 0  ?PHQ-9 Score 18 2  ?Difficult doing work/chores Very difficult Not difficult at all  ? ? ?  09/03/2021  ? 10:21 AM  ?GAD 7 : Generalized Anxiety Score  ?Nervous, Anxious, on Edge 2  ?Control/stop worrying 3  ?Worry too much - different things 3  ?Trouble relaxing 3  ?Restless 2  ?Easily annoyed or irritable 3  ?Afraid - awful might happen  3  ?Total GAD 7 Score 19  ?Anxiety Difficulty Somewhat difficult  ? ? ? ? ? ?   ?Assessment & Plan:  ? ?Problem List Items Addressed This Visit   ? ?  ? Cardiovascular and Mediastinum  ? Migraines  ?  Stable on topiramate.  Discussed Wellbutrin could worsen she will update if this happens ?  ?  ? Relevant Medications  ? buPROPion (WELLBUTRIN XL) 150 MG 24 hr tablet  ? Other Relevant Orders  ? Comprehensive metabolic panel  ? CBC  ?  ? Other  ? Depression, major, single episode, moderate (HCC) - Primary  ?  Chronic though never on treatment in the past.  Worsening in the setting of her daughters change in behavior and ongoing psychiatric needs.  Discussed starting Wellbutrin patient's preference due to wanting to lose weight and not and avoid weight gaining medications.  Start 150 mg increased to 300 mg as tolerated after 2 weeks.  Discussed that this could worsen anxiety as well as migraine she will update if she has either.  Number for therapy as well as encouraged her to consider talk space through employee benefits. ?  ?  ? Relevant Medications  ? buPROPion (WELLBUTRIN XL)  150 MG 24 hr tablet  ? ALPRAZolam (XANAX) 0.5 MG tablet  ? Other Relevant Orders  ? Comprehensive metabolic panel  ? TSH  ? Vitamin D, 25-hydroxy  ? CBC  ? Generalized anxiety disorder  ?  Impacting sleep worsening in setting of situation with daughter, start Wellbutrin due to desire for voiding weight gain.  Discussed as needed Xanax for sleep and worsening anxiety for short course while getting on medication.  She has been advised to keep this secure from her daughter who has broken into her alcohol in the past. ?  ?  ? Relevant Medications  ? buPROPion (WELLBUTRIN XL) 150 MG 24 hr tablet  ? ALPRAZolam (XANAX) 0.5 MG tablet  ? ?Other Visit Diagnoses   ? ? Screening for hyperlipidemia      ? Relevant Orders  ? Lipid panel  ? Menorrhagia with regular cycle      ? Relevant Orders  ? Comprehensive metabolic panel  ? CBC  ? ?  ? ? ? ?Return in about 6 weeks (around 10/15/2021) for depression/anxiety. ? ?[name], MD ? ? ? ?

## 2021-09-03 NOTE — Assessment & Plan Note (Signed)
Impacting sleep worsening in setting of situation with daughter, start Wellbutrin due to desire for voiding weight gain.  Discussed as needed Xanax for sleep and worsening anxiety for short course while getting on medication.  She has been advised to keep this secure from her daughter who has broken into her alcohol in the past. ?

## 2021-09-03 NOTE — Assessment & Plan Note (Signed)
Chronic though never on treatment in the past.  Worsening in the setting of her daughters change in behavior and ongoing psychiatric needs.  Discussed starting Wellbutrin patient's preference due to wanting to lose weight and not and avoid weight gaining medications.  Start 150 mg increased to 300 mg as tolerated after 2 weeks.  Discussed that this could worsen anxiety as well as migraine she will update if she has either.  Number for therapy as well as encouraged her to consider talk space through employee benefits. ?

## 2021-09-03 NOTE — Patient Instructions (Signed)
Consider signing up for [employer] - upside you can start talking tomorrow, down side you only get a video chat monthly ? ? ?You are going to start a new antidepressant medication.  ? ?One of the risks of this medication is increase in suicidal thoughts.  ? ?Your suicide Action plan is as follows:  ?1) Call - amber ?2) Call the Suicide Hotline [phone] which is available 24 hours ?3) Call the Clinic ? ? ? ?The most common side effect is stomach upset. If this happens it means the medication is working. It should get better in 1-3 weeks.  ? ?Medication for depression and anxiety often takes 6-8 weeks to have a noticeable difference so stick with it. Also the best way for recovery is taking medication and seeing a therapist -- this is so important.  ? ? ?How to help anxiety and depression ? ?1) Regular Exercise - walking, jogging, cycling, dancing, strength training - aiming for 150 minutes of exercise a week ?--> Yoga has been shown in research to reduce depression and anxiety -- with even just one hour long session per week ? ?2)  Begin a Mindfulness/Meditation practice -- this can take a little as 3 minutes and is helpful for all kinds of mood issues ?-- You can find resources in books ?-- Or you can download apps like  ?---- Headspace App  ?---- Calm  ?---- Insignt Timer ?---- Stop, Breathe & Think ? ?# With each of these Apps - you should decline the "start free trial" offer and as you search through the App should be able to access some of their free content. You can also chose to pay for the content if you find one that works well for you.  ? ?# Many of them also offer sleep specific content which may help with insomnia ? ?3) Healthy Diet ?-- Avoid or decrease Caffeine ?-- Avoid or decrease Alcohol ?-- Drink plenty of water, have a balanced diet ?-- Avoid cigarettes and marijuana (as well as other recreational drugs) ? ?4) Find a therapist  ?-- [facility] is one option. Call [phone] ?--  Or you can check out www.[email or web address] -- you can read bios of therapists and see if they accept insurance ?-- Check with your insurance to see if you have coverage and who may take your insurance ? ?

## 2021-09-04 ENCOUNTER — Other Ambulatory Visit: Payer: Self-pay

## 2021-09-04 ENCOUNTER — Encounter: Payer: Self-pay | Admitting: Family Medicine

## 2021-09-04 DIAGNOSIS — E038 Other specified hypothyroidism: Secondary | ICD-10-CM

## 2021-09-04 MED ORDER — LEVOTHYROXINE SODIUM 25 MCG PO TABS
25.0000 ug | ORAL_TABLET | Freq: Every day | ORAL | 0 refills | Status: DC
  Filled 2021-09-04: qty 90, 90d supply, fill #0

## 2021-09-12 ENCOUNTER — Other Ambulatory Visit: Payer: Self-pay

## 2021-09-14 ENCOUNTER — Other Ambulatory Visit: Payer: Self-pay

## 2021-09-16 ENCOUNTER — Other Ambulatory Visit: Payer: Self-pay

## 2021-09-18 ENCOUNTER — Other Ambulatory Visit: Payer: Self-pay

## 2021-09-18 ENCOUNTER — Other Ambulatory Visit: Payer: Self-pay | Admitting: Family Medicine

## 2021-09-18 MED ORDER — QULIPTA 60 MG PO TABS
60.0000 mg | ORAL_TABLET | Freq: Every day | ORAL | 2 refills | Status: DC
  Filled 2021-09-18: qty 90, 90d supply, fill #0

## 2021-09-19 ENCOUNTER — Other Ambulatory Visit: Payer: Self-pay

## 2021-10-09 ENCOUNTER — Other Ambulatory Visit: Payer: Self-pay

## 2021-10-10 ENCOUNTER — Ambulatory Visit: Payer: No Typology Code available for payment source | Admitting: Family Medicine

## 2021-10-10 ENCOUNTER — Other Ambulatory Visit: Payer: Self-pay | Admitting: Family Medicine

## 2021-10-10 ENCOUNTER — Other Ambulatory Visit: Payer: Self-pay

## 2021-10-10 ENCOUNTER — Ambulatory Visit (INDEPENDENT_AMBULATORY_CARE_PROVIDER_SITE_OTHER): Payer: No Typology Code available for payment source | Admitting: Family Medicine

## 2021-10-10 VITALS — BP 100/70 | HR 51 | Temp 97.0°F | Ht 64.0 in | Wt 167.4 lb

## 2021-10-10 DIAGNOSIS — R635 Abnormal weight gain: Secondary | ICD-10-CM

## 2021-10-10 DIAGNOSIS — R5383 Other fatigue: Secondary | ICD-10-CM | POA: Insufficient documentation

## 2021-10-10 DIAGNOSIS — R7989 Other specified abnormal findings of blood chemistry: Secondary | ICD-10-CM

## 2021-10-10 DIAGNOSIS — G43709 Chronic migraine without aura, not intractable, without status migrainosus: Secondary | ICD-10-CM

## 2021-10-10 DIAGNOSIS — R200 Anesthesia of skin: Secondary | ICD-10-CM | POA: Diagnosis not present

## 2021-10-10 DIAGNOSIS — E063 Autoimmune thyroiditis: Secondary | ICD-10-CM

## 2021-10-10 DIAGNOSIS — F321 Major depressive disorder, single episode, moderate: Secondary | ICD-10-CM

## 2021-10-10 DIAGNOSIS — F411 Generalized anxiety disorder: Secondary | ICD-10-CM

## 2021-10-10 DIAGNOSIS — Z6830 Body mass index (BMI) 30.0-30.9, adult: Secondary | ICD-10-CM | POA: Insufficient documentation

## 2021-10-10 HISTORY — DX: Autoimmune thyroiditis: E06.3

## 2021-10-10 NOTE — Patient Instructions (Addendum)
Return in 3-4 weeks for labs  - Thyroid and your B12 - refill vit D - then take 1000 units daily  Follow-up 4-6 weeks if you start medication  Otherwise as needed   Referral to healthy weight and wellness  #Referral I have placed a referral to a specialist for you. You should receive a phone call from the specialty office. Make sure your voicemail is not full and that if you are able to answer your phone to unknown or new numbers.   It may take up to 2 weeks to hear about the referral. If you do not hear anything in 2 weeks, please call our office and ask to speak with the referral coordinator.

## 2021-10-10 NOTE — Assessment & Plan Note (Signed)
Doing well on atogepant and topiramate. Reviewed medication side effects and no cause in new medication for weight gain or sweating.

## 2021-10-10 NOTE — Assessment & Plan Note (Signed)
Patient has been gaining weight despite regular exercise and generally healthy diet.  Vitamin D was low she is on replacement for this.  Abnormalities in thyroid testing, but may be secondary to biotin.  We will repeat this testing.  Discussed referral to healthy weight and wellness as unclear why she is continuing to gain weight weight despite healthy lifestyle changes.

## 2021-10-10 NOTE — Progress Notes (Signed)
Subjective:     Christy Whitehead is a 36 y.o. female presenting for Follow-up (6 wk- dep/anx)     HPI  #Anxiety/depression - has been taking the Vit D and levo - considered starting Wellbutrin but didn't  - daughter still having a lots of issues - going to therapy which is helping, going back to church - some days are good and others not  - finished the 4 weeks of vitamin D - slightly less tired - prior to taking the medication  - 6 months of brittle hair, sweating more often than prior - was needing meds deodorant - no changes with the levothyroxine  Gallon of water per day Healthy diet - broccoli and chicken Exercising daily   Review of Systems  09/03/2021: Clinic - depression/anxiety - Abnormal TSH and low Vit D - low dose levothyroxine and vit D replacement  Social History   Tobacco Use  Smoking Status Former   Packs/day: 1.00   Years: 13.00   Pack years: 13.00   Types: Cigarettes   Quit date: 2018   Years since quitting: 5.3  Smokeless Tobacco Never        Objective:    BP Readings from Last 3 Encounters:  10/10/21 100/70  09/03/21 102/64  07/09/21 100/70   Wt Readings from Last 3 Encounters:  10/10/21 167 lb 6 oz (75.9 kg)  09/03/21 166 lb 1 oz (75.3 kg)  07/09/21 164 lb (74.4 kg)    BP 100/70   Pulse (!) 51   Temp (!) 97 F (36.1 C) (Temporal)   Ht 5\' 4"  (1.626 m)   Wt 167 lb 6 oz (75.9 kg)   SpO2 99%   BMI 28.73 kg/m    Physical Exam Constitutional:      General: She is not in acute distress.    Appearance: She is well-developed. She is not diaphoretic.  HENT:     Right Ear: External ear normal.     Left Ear: External ear normal.     Nose: Nose normal.  Eyes:     Conjunctiva/sclera: Conjunctivae normal.  Cardiovascular:     Rate and Rhythm: Normal rate.  Pulmonary:     Effort: Pulmonary effort is normal.  Musculoskeletal:     Cervical back: Neck supple.  Skin:    General: Skin is warm and dry.     Capillary  Refill: Capillary refill takes less than 2 seconds.  Neurological:     Mental Status: She is alert. Mental status is at baseline.  Psychiatric:        Mood and Affect: Mood normal.        Behavior: Behavior normal.       10/10/2021   10:39 AM 09/03/2021   10:20 AM 09/28/2018    9:21 AM  Depression screen PHQ 2/9  Decreased Interest 1 2 0  Down, Depressed, Hopeless 1 1 0  PHQ - 2 Score 2 3 0  Altered sleeping 2 3 1   Tired, decreased energy 2 3 1   Change in appetite 0 3 0  Feeling bad or failure about yourself  2 2 0  Trouble concentrating 1 2 0  Moving slowly or fidgety/restless 0 2 0  Suicidal thoughts 0 0 0  PHQ-9 Score 9 18 2   Difficult doing work/chores Somewhat difficult Very difficult Not difficult at all      10/10/2021   10:40 AM 09/03/2021   10:21 AM  GAD 7 : Generalized Anxiety Score  Nervous, Anxious, on Edge  2 2  Control/stop worrying 2 3  Worry too much - different things 2 3  Trouble relaxing 1 3  Restless 2 2  Easily annoyed or irritable 3 3  Afraid - awful might happen 2 3  Total GAD 7 Score 14 19  Anxiety Difficulty Very difficult Somewhat difficult          Assessment & Plan:   Problem List Items Addressed This Visit       Cardiovascular and Mediastinum   Migraines    Doing well on atogepant and topiramate. Reviewed medication side effects and no cause in new medication for weight gain or sweating.          Other   Depression, major, single episode, moderate (HCC)    Some improvement in symptoms likely secondary to improvement in her relationship with her daughter.  Discussed continuing to monitor and updating if she decides to start Wellbutrin.       Generalized anxiety disorder    Some improvement in symptoms.  She declined medication.  Her daughter is in therapy and she has been encouraged to also pursue therapy.       Other fatigue    Fatigue and numbness we will check vitamin D B12 and repeat thyroid due to concern for possible  biotin cause.       Relevant Orders   Vitamin B12   Elevated TSH - Primary   Relevant Orders   TSH   Weight gain    Patient has been gaining weight despite regular exercise and generally healthy diet.  Vitamin D was low she is on replacement for this.  Abnormalities in thyroid testing, but may be secondary to biotin.  We will repeat this testing.  Discussed referral to healthy weight and wellness as unclear why she is continuing to gain weight weight despite healthy lifestyle changes.       Relevant Orders   Amb Ref to Medical Weight Management   Other Visit Diagnoses     Numbness       Relevant Orders   Vitamin B12        Return if symptoms worsen or fail to improve.  [name], MD

## 2021-10-10 NOTE — Assessment & Plan Note (Signed)
Fatigue and numbness we will check vitamin D B12 and repeat thyroid due to concern for possible biotin cause.

## 2021-10-10 NOTE — Assessment & Plan Note (Signed)
Some improvement in symptoms likely secondary to improvement in her relationship with her daughter.  Discussed continuing to monitor and updating if she decides to start Wellbutrin.

## 2021-10-10 NOTE — Assessment & Plan Note (Addendum)
Some improvement in symptoms.  She declined medication.  Her daughter is in therapy and she has been encouraged to also pursue therapy.

## 2021-10-22 ENCOUNTER — Other Ambulatory Visit: Payer: Self-pay

## 2021-10-23 NOTE — Progress Notes (Unsigned)
PATIENT: Christy Whitehead DOB: 1985-09-16  REASON FOR VISIT: follow up HISTORY FROM: patient  No chief complaint on file.    HISTORY OF PRESENT ILLNESS:  10/23/21 ALL: Christy Whitehead returns for follow up for migraines. She was last seen 08/2020 and switched from [facility] to [facility] due to skin irritation and continued on topiramate 100mg  BID. Rizatriptan was switched to sumatriptan for abortive therapy.   09/11/2020 ALL:  She returns for migraine follow up. She continues topiramate 100mg  BID. She was switched from [facility] to [facility] due to local reaction but reports reaction was worse with Emgality. She stopped CGRP about a year ago. Migraines have worsened over past year. Now having daily headaches. She has about 4 bad migraines per month. Some last several days. Usually takes Tylenol or ibuprofen. She takes rizatriptan about 2-3 times a month. It does not usually help.   09/08/2019 ALL:  Christy Whitehead is a 36 y.o. female here today for follow up for migraines. She continues topiramate 100mg  BID and Emgality monthly. Rizatriptan continued for abortive therapy. She continues to have about 10-12 times a month. 4-5 are migrainous. Rizatriptan helps some. She also takes Tylenol, Excedrin or Ibuprofen a couple of times a week. Intensity of headaches is significantly improved. Previously having 10/10 pain with migraines, now maybe 2-3/10. She switched to [facility] and feels that this has helped. 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    HISTORY: (copied from Dr Gladstone Lighter note on 05/10/2019)  UPDATE (05/10/19, [facility]P): Since last visit, was doing well for a few months, with aimovig, then in last 2 months slightly worse. Now with daily tension headaches. Also 1-2 severe migraine per month. Symptoms are increased. Stress, N95 masks, may be aggravating factors. Tried adjusting birth control hormones with ob/gyn, but did not help. Some injection site reaction with aimovig. TPX and  rizatriptan working.     PRIOR HPI: 36 year old female here for evaluation migraine headaches.   Patient has had migraine headaches since teenage years.  She describes frontal, sometimes unilateral, throbbing severe headaches associated with phonophobia and nausea.  No visual aura.  Patient also has some lower back pain daily headaches.  Patient has previously tried topiramate, Tofranil and imipramine.  She has tried trigger point injections with mild relief.  She has tried sumatriptan and Fioricet with mild relief.  She is also used Tylenol, ibuprofen and Robaxin.   Patient has approximately 1 week of severe headaches per month in addition to daily headaches.  No specific triggering factors.  Patient has tried to improve her nutrition, sleep and caffeine intake.  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  REVIEW OF SYSTEMS: Out of a complete 14 system review of symptoms, the patient complains only of the following symptoms, headaches and all other reviewed systems are negative.  ALLERGIES: Allergies  Allergen Reactions   Aimovig [Erenumab-Aooe] Hives    Site reaction   Emgality [Galcanezumab-Gnlm] Hives    Site reaction    HOME MEDICATIONS: Outpatient Medications Prior to Visit  Medication Sig Dispense Refill   acetaminophen (TYLENOL) 500 MG tablet Take 500 mg by mouth every 6 (six) hours as needed.     ALPRAZolam (XANAX) 0.5 MG tablet Take 0.5-1 tablets (0.25-0.5 mg total) by mouth at bedtime as needed for anxiety. 20 tablet 0   Aspirin-Acetaminophen-Caffeine (EXCEDRIN PO) Take by mouth as needed.     Atogepant (QULIPTA) 60 MG TABS Take 1 tablet (60 mg) by mouth daily. 90 tablet 2  buPROPion (WELLBUTRIN XL) 150 MG 24 hr tablet Take 150 mg daily for 2 weeks. Then increase to 300 mg daily. (Patient not taking: Reported on 10/10/2021) 60 tablet 1   Cholecalciferol 1.25 MG (50000 UT) capsule Take by mouth once a week. 4 capsule 1   etonogestrel-ethinyl estradiol (NUVARING)  0.12-0.015 MG/24HR vaginal ring INSERT VAGINALLY AND LEAVE IN PLACE FOR 3 CONSECUTIVE WEEKS, THEN REPLACE FOR CONTINUOUS DOSING DUE TO HEADACHES 3 each 4   ondansetron (ZOFRAN-ODT) 4 MG disintegrating tablet Take 1 tablet (4 mg total) by mouth every 8 (eight) hours as needed for nausea or vomiting. 10 tablet 3   SF 5000 PLUS 1.1 % CREA dental cream Take by mouth.     SUMAtriptan (IMITREX) 100 MG tablet Take 1 tablet (100 mg total) by mouth once as needed for up to 1 dose for migraine. May repeat in 2 hours if headache persists or recurs. 10 tablet 2   topiramate (TOPAMAX) 100 MG tablet Take 1 tablet (100 mg total) by mouth 2 (two) times daily. Please keep upcoming appt in March for continued refills. 180 tablet 0   No facility-administered medications prior to visit.    PAST MEDICAL HISTORY: Past Medical History:  Diagnosis Date   Abnormal Pap smear of cervix 12/19/2008, 02/20/2009   ASCUS   Abnormal Pap smear of cervix 08/06/2010, 10/01/2010   HGSIL   Cervical dysplasia CIN1 06/05/2004   CIN III (cervical intraepithelial neoplasia grade III) with severe dysplasia 12/20/2010   LEEP Ecto Cervix   Dysmenorrhea    Migraines     PAST SURGICAL HISTORY: Past Surgical History:  Procedure Laterality Date   CHOLECYSTECTOMY  2015   COLPOSCOPY  06/05/2004,02/20/2009,10/01/2010   LEEP  12/20/2010   ECC &Endo Cx Negative, Ecto CIN 2-3    FAMILY HISTORY: Family History  Problem Relation Age of Onset   COPD Mother    Diabetes Mother    Diabetes Father    Hypertension Father    Bipolar disorder Father    Breast cancer Paternal Grandmother 106   Diabetes Sister    Obesity Sister        morbid   Mental retardation Sister    Healthy Daughter    Suicidality Paternal Grandfather     SOCIAL HISTORY: Social History   Socioeconomic History   Marital status: Single    Spouse name: Not on file   Number of children: 1   Years of education: College   Highest education level: Bachelor's degree  (e.g., BA, AB, BS)  Occupational History    Comment: RN in ED, night shift  Tobacco Use   Smoking status: Former    Packs/day: 1.00    Years: 13.00    Pack years: 13.00    Types: Cigarettes    Quit date: 2018    Years since quitting: 5.4   Smokeless tobacco: Never  Vaping Use   Vaping Use: Never used  Substance and Sexual Activity   Alcohol use: Yes    Comment: once every 2 weeks, occasionally >4   Drug use: No   Sexual activity: Not Currently    Birth control/protection: Inserts    Comment: nuvaring  Other Topics Concern   Not on file  Social History Narrative   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 [employer]   One Daughter - [name] - 73 years old now   Family support: dad is nearby and watches daughter   Enjoys: works a lot, spend time with friends, hiking,  corn-hole, beach   Exercise: gym regularly - 1-2 since the gym closed   Diet: pretty healthy overall   Social Determinants of Health   Financial Resource Strain: Not on file  Food Insecurity: Not on file  Transportation Needs: Not on file  Physical Activity: Not on file  Stress: Not on file  Social Connections: Not on file  Intimate Partner Violence: Not on file      PHYSICAL EXAM  There were no vitals filed for this visit.  There is no height or weight on file to calculate BMI.  Generalized: Well developed, in no acute distress  Cardiology: normal rate and rhythm, no murmur noted Respiratory: clear to auscultation bilaterally  Neurological examination  Mentation: Alert oriented to time, place, history taking. Follows all commands speech and language fluent Cranial nerve II-XII: Pupils were equal round reactive to light. Extraocular movements were full, visual field were full on confrontational test. Facial sensation and strength were normal. Head turning and shoulder shrug  were normal and symmetric. Motor: The motor testing reveals 5 over 5 strength of all 4 extremities. Good symmetric motor tone is noted  throughout.  Gait and station: Gait is normal.     DIAGNOSTIC DATA (LABS, IMAGING, TESTING) - I reviewed patient records, labs, notes, testing and imaging myself where available.      View : No data to display.           Lab Results  Component Value Date   WBC 7.5 09/03/2021   HGB 12.4 09/03/2021   HCT 37.2 09/03/2021   MCV 88.1 09/03/2021   PLT 272.0 09/03/2021      Component Value Date/Time   NA 137 09/03/2021 0920   K 3.9 09/03/2021 0920   CL 106 09/03/2021 0920   CO2 23 09/03/2021 0920   GLUCOSE 93 09/03/2021 0920   BUN 13 09/03/2021 0920   CREATININE 0.81 09/03/2021 0920   CALCIUM 9.3 09/03/2021 0920   PROT 6.3 09/03/2021 0920   ALBUMIN 3.8 09/03/2021 0920   AST 15 09/03/2021 0920   ALT 18 09/03/2021 0920   ALKPHOS 42 09/03/2021 0920   BILITOT 0.3 09/03/2021 0920   Lab Results  Component Value Date   CHOL 193 09/03/2021   HDL 73.80 09/03/2021   LDLCALC 97 09/03/2021   TRIG 110.0 09/03/2021   CHOLHDL 3 09/03/2021   No results found for: HGBA1C No results found for: VITAMINB12 Lab Results  Component Value Date   TSH 11.93 (H) 09/03/2021       ASSESSMENT AND PLAN 36 y.o. year old female  has a past medical history of Abnormal Pap smear of cervix (12/19/2008, 02/20/2009), Abnormal Pap smear of cervix (08/06/2010, 10/01/2010), Cervical dysplasia CIN1 (06/05/2004), CIN III (cervical intraepithelial neoplasia grade III) with severe dysplasia (12/20/2010), Dysmenorrhea, and Migraines. here with   No diagnosis found.    Brodie has had worsening headaches/migraines over the past year. She has not been able to tolerate Emgality due to local skin reaction. We will continue topiramate 100mg  twice daily. I will add Qulipta 60mg  daily. We will switch abortive therapy to sumatriptan as needed. She may use OTC analgesics but was advised against regular use to avoid concerns of rebound headaches. Adequate hydration, well balanced diet and regular exercise advised. She  will follow up in 3 months, sooner if needed. She verbalizes understanding and agreement with this plan.    No orders of the defined types were placed in this encounter.    No orders of the defined  types were placed in this encounter.     Debbora Presto, FNP-C 10/23/2021, 4:36 PM [facility] [address] [place], [place] [address] [phone]

## 2021-10-23 NOTE — Patient Instructions (Incomplete)
Below is our plan:  We will continue topiramate 100mg  twice daily and Qulipta 60mg  daily. Continue sumatriptan as needed.   Please make sure you are staying well hydrated. I recommend 50-60 ounces daily. Well balanced diet and regular exercise encouraged. Consistent sleep schedule with 6-8 hours recommended.   Please continue follow up with care team as directed.   Follow up with me in 1 year   You may receive a survey regarding today's visit. I encourage you to leave honest feed back as I do use this information to improve patient care. Thank you for seeing me today!

## 2021-10-24 ENCOUNTER — Other Ambulatory Visit: Payer: Self-pay

## 2021-10-24 ENCOUNTER — Ambulatory Visit: Payer: No Typology Code available for payment source | Admitting: Family Medicine

## 2021-10-24 ENCOUNTER — Encounter: Payer: Self-pay | Admitting: Family Medicine

## 2021-10-24 VITALS — BP 109/70 | HR 62 | Ht 64.0 in | Wt 160.0 lb

## 2021-10-24 DIAGNOSIS — G43009 Migraine without aura, not intractable, without status migrainosus: Secondary | ICD-10-CM | POA: Diagnosis not present

## 2021-10-24 MED ORDER — SUMATRIPTAN SUCCINATE 100 MG PO TABS
100.0000 mg | ORAL_TABLET | Freq: Once | ORAL | 11 refills | Status: DC | PRN
  Filled 2021-10-24: qty 10, 34d supply, fill #0
  Filled 2022-08-14: qty 10, 34d supply, fill #1
  Filled 2022-10-14: qty 10, 34d supply, fill #2

## 2021-10-24 MED ORDER — ONDANSETRON 4 MG PO TBDP
4.0000 mg | ORAL_TABLET | Freq: Three times a day (TID) | ORAL | 3 refills | Status: DC | PRN
  Filled 2021-10-24: qty 10, 4d supply, fill #0
  Filled 2022-01-22: qty 10, 4d supply, fill #1
  Filled 2022-04-03: qty 10, 4d supply, fill #2
  Filled 2022-05-09: qty 10, 4d supply, fill #3

## 2021-10-24 MED ORDER — TOPIRAMATE 100 MG PO TABS
100.0000 mg | ORAL_TABLET | Freq: Two times a day (BID) | ORAL | 3 refills | Status: DC
  Filled 2021-10-24: qty 180, 90d supply, fill #0
  Filled 2022-01-22: qty 180, 90d supply, fill #1
  Filled 2022-05-09: qty 180, 90d supply, fill #2
  Filled 2022-08-09: qty 180, 90d supply, fill #3

## 2021-10-24 MED ORDER — QULIPTA 60 MG PO TABS
60.0000 mg | ORAL_TABLET | Freq: Every day | ORAL | 3 refills | Status: DC
  Filled 2021-10-24 – 2021-12-24 (×2): qty 90, 90d supply, fill #0
  Filled 2022-04-03: qty 90, 90d supply, fill #1
  Filled 2022-05-09 – 2022-07-07 (×2): qty 90, 90d supply, fill #2
  Filled 2022-10-14: qty 90, 90d supply, fill #3

## 2021-11-21 ENCOUNTER — Ambulatory Visit: Payer: No Typology Code available for payment source | Admitting: Family Medicine

## 2021-12-14 ENCOUNTER — Other Ambulatory Visit: Payer: Self-pay

## 2021-12-16 ENCOUNTER — Other Ambulatory Visit: Payer: Self-pay

## 2021-12-24 ENCOUNTER — Other Ambulatory Visit: Payer: Self-pay

## 2021-12-25 ENCOUNTER — Other Ambulatory Visit: Payer: Self-pay

## 2022-01-22 ENCOUNTER — Other Ambulatory Visit: Payer: Self-pay

## 2022-03-05 NOTE — Patient Instructions (Signed)

## 2022-03-06 ENCOUNTER — Ambulatory Visit (INDEPENDENT_AMBULATORY_CARE_PROVIDER_SITE_OTHER): Payer: No Typology Code available for payment source | Admitting: Nurse Practitioner

## 2022-03-06 ENCOUNTER — Encounter: Payer: Self-pay | Admitting: Nurse Practitioner

## 2022-03-06 VITALS — BP 113/71 | HR 62 | Temp 98.1°F | Ht 64.0 in | Wt 175.0 lb

## 2022-03-06 DIAGNOSIS — Z7689 Persons encountering health services in other specified circumstances: Secondary | ICD-10-CM

## 2022-03-06 DIAGNOSIS — Z1159 Encounter for screening for other viral diseases: Secondary | ICD-10-CM

## 2022-03-06 DIAGNOSIS — Z6830 Body mass index (BMI) 30.0-30.9, adult: Secondary | ICD-10-CM

## 2022-03-06 DIAGNOSIS — F321 Major depressive disorder, single episode, moderate: Secondary | ICD-10-CM | POA: Diagnosis not present

## 2022-03-06 DIAGNOSIS — E063 Autoimmune thyroiditis: Secondary | ICD-10-CM | POA: Diagnosis not present

## 2022-03-06 DIAGNOSIS — Z114 Encounter for screening for human immunodeficiency virus [HIV]: Secondary | ICD-10-CM

## 2022-03-06 DIAGNOSIS — F411 Generalized anxiety disorder: Secondary | ICD-10-CM | POA: Diagnosis not present

## 2022-03-06 DIAGNOSIS — G43709 Chronic migraine without aura, not intractable, without status migrainosus: Secondary | ICD-10-CM

## 2022-03-06 DIAGNOSIS — E559 Vitamin D deficiency, unspecified: Secondary | ICD-10-CM

## 2022-03-06 DIAGNOSIS — R7989 Other specified abnormal findings of blood chemistry: Secondary | ICD-10-CM

## 2022-03-06 HISTORY — DX: Vitamin D deficiency, unspecified: E55.9

## 2022-03-06 NOTE — Progress Notes (Signed)
New Patient Office Visit  Subjective    Patient ID: Christy Whitehead, female    DOB: 1985/10/25  Age: 36 y.o. MRN: [id number]  CC:  Chief Complaint  Patient presents with   New Patient (Initial Visit)    Patient is here as a New Patient to Establish Care. Patient says her previous PCP left the office. Patient says she was informed that she may or may not have a Thyroid issue and she would like to have it rechecked as she has gained at least 25-lbs since May.    Weight Gain   Excessive Sweating    HPI Christy Whitehead presents for new patient visit to establish care.  Introduced to Publishing rights manager role and practice setting.  All questions answered.  Discussed provider/patient relationship and expectations.  Works at [employer] in [employer] setting.    HYPOTHYROIDISM In April this year had thyroid checked with previous PCP and levels were elevated with TSH 11.93.  Was started on Synthroid, then was told to stop taking as she took Biotin and could affect lab -- was to recheck labs, when she returned they did not recheck labs.  Has not recently been taking Biotin.    No current thyroid treatment. Since beginning of this year has had excessive sweating and Synthroid made this worse.  Is taking Vitamin D supplement for history of low levels. Thyroid control status:uncontrolled Etiology of hypothyroidism: unknown Fatigue: yes Cold intolerance: no Heat intolerance: yes Weight gain: yes - 25 pounds since summertime even with regular exercise and healthy diet Weight loss: no Constipation: yes -- takes Magnesium Diarrhea/loose stools: no Palpitations: no Lower extremity edema: no Anxiety/depressed mood:  does have anxiety    MIGRAINES Followed by neurology with last visit on 10/24/21.  Taking Topamax, Qulipta, and Imitrex. Has dealt with migraines over past 10 years - her dentist reports her migraines could be from clenching teeth, he recommended mouth guard. Duration: chronic Onset:  gradual Severity: 9/10 Quality: dull, aching, and throbbing Frequency: intermittent Location: in temples Headache duration: multiple hours -- >24 hours Radiation: no Time of day headache occurs: varies Alleviating factors: Tylenol Aggravating factors: around menstrual cycles (Nuvaring with GYN), mornings (?clenching teeth) Headache status at time of visit: asymptomatic Treatments attempted: Treatments attempted: rest, APAP, ibuprofen, aleve", excedrine, and topamax   Aura: no Nausea:  yes Vomiting: yes Photophobia:  no Phonophobia:  yes Effect on social functioning:  no Numbers of missed days of school/work each month: none Confusion:  no Gait disturbance/ataxia:  no Behavioral changes:  no Fevers:  no   ANXIETY/DEPRESSION Was ordered Wellbutrin, but never started this.  Was given Xanax at one time to help her sleep + anxiety -- for short period (5 pills).  Has not taken any other medications in past for mood.  Has 37 yo daughter which is stressor, she is getting back on right path now. Mood status: stable Psychotherapy/counseling: currently seeing therapist ([facility]) Previous psychiatric medications: Xanax  Depressed mood: no Anxious mood: yes Anhedonia: no Significant weight loss or gain: yes Insomnia: yes hard to stay asleep Fatigue: yes Feelings of worthlessness or guilt: no Impaired concentration/indecisiveness:  sometimes Suicidal ideations: no Hopelessness: no Crying spells: no    03/06/2022    8:48 AM 10/10/2021   10:39 AM 09/03/2021   10:20 AM 09/28/2018    9:21 AM  Depression screen PHQ 2/9  Decreased Interest 1 1 2  0  Down, Depressed, Hopeless 1 1 1  0  PHQ - 2 Score 2 2  3 0  Altered sleeping 3 2 3 1   Tired, decreased energy 2 2 3 1   Change in appetite 2 0 3 0  Feeling bad or failure about yourself  1 2 2  0  Trouble concentrating 1 1 2  0  Moving slowly or fidgety/restless 2 0 2 0  Suicidal thoughts 0 0 0 0  PHQ-9 Score 13 9 18 2   Difficult doing  work/chores Somewhat difficult Somewhat difficult Very difficult Not difficult at all       03/06/2022    8:49 AM 10/10/2021   10:40 AM 09/03/2021   10:21 AM  GAD 7 : Generalized Anxiety Score  Nervous, Anxious, on Edge 1 2 2   Control/stop worrying 1 2 3   Worry too much - different things 1 2 3   Trouble relaxing 2 1 3   Restless 1 2 2   Easily annoyed or irritable 2 3 3   Afraid - awful might happen 1 2 3   Total GAD 7 Score 9 14 19   Anxiety Difficulty Somewhat difficult Very difficult Somewhat difficult     Outpatient Encounter Medications as of 03/06/2022  Medication Sig   acetaminophen (TYLENOL) 500 MG tablet Take 500 mg by mouth every 6 (six) hours as needed.   ALPRAZolam (XANAX) 0.5 MG tablet Take 0.5-1 tablets (0.25-0.5 mg total) by mouth at bedtime as needed for anxiety.   Aspirin-Acetaminophen-Caffeine (EXCEDRIN PO) Take by mouth as needed.   Atogepant (QULIPTA) 60 MG TABS Take 1 tablet (60 mg) by mouth daily.   Cholecalciferol 1.25 MG (50000 UT) capsule Take by mouth once a week.   etonogestrel-ethinyl estradiol (NUVARING) 0.12-0.015 MG/24HR vaginal ring INSERT VAGINALLY AND LEAVE IN PLACE FOR 3 CONSECUTIVE WEEKS, THEN REPLACE FOR CONTINUOUS DOSING DUE TO HEADACHES   ondansetron (ZOFRAN-ODT) 4 MG disintegrating tablet Take 1 tablet (4 mg total) by mouth every 8 (eight) hours as needed for nausea or vomiting.   SF 5000 PLUS 1.1 % CREA dental cream Take by mouth.   SUMAtriptan (IMITREX) 100 MG tablet Take 1 tablet (100 mg total) by mouth once as needed for up to 1 dose for migraine. May repeat in 2 hours if headache persists or recurs.   topiramate (TOPAMAX) 100 MG tablet Take 1 tablet (100 mg total) by mouth 2 (two) times daily. Please keep upcoming appt in March for continued refills.   [DISCONTINUED] buPROPion (WELLBUTRIN XL) 150 MG 24 hr tablet Take 150 mg daily for 2 weeks. Then increase to 300 mg daily. (Patient not taking: Reported on 03/06/2022)   No facility-administered  encounter medications on file as of 03/06/2022.    Past Medical History:  Diagnosis Date   Abnormal Pap smear of cervix 12/19/2008, 02/20/2009   ASCUS   Abnormal Pap smear of cervix 08/06/2010, 10/01/2010   HGSIL   Cervical dysplasia CIN1 06/05/2004   CIN III (cervical intraepithelial neoplasia grade III) with severe dysplasia 12/20/2010   LEEP Ecto Cervix   Dysmenorrhea    Migraines     Past Surgical History:  Procedure Laterality Date   CHOLECYSTECTOMY  2015   COLPOSCOPY  06/05/2004,02/20/2009,10/01/2010   LEEP  12/20/2010   ECC &Endo Cx Negative, Ecto CIN 2-3    Family History  Problem Relation Age of Onset   COPD Mother    Diabetes Mother    Diabetes Father    Hypertension Father    Bipolar disorder Father    Breast cancer Paternal Grandmother 88   Diabetes Sister    Obesity Sister  morbid   Mental retardation Sister    Healthy Daughter    Suicidality Paternal Grandfather    Social History   Socioeconomic History   Marital status: Single    Spouse name: Not on file   Number of children: 1   Years of education: College   Highest education level: Bachelor's degree (e.g., BA, AB, BS)  Occupational History    Comment: RN in ED, night shift  Tobacco Use   Smoking status: Former    Packs/day: 1.00    Years: 13.00    Total pack years: 13.00    Types: Cigarettes    Quit date: 2018    Years since quitting: 5.7   Smokeless tobacco: Never  Vaping Use   Vaping Use: Never used  Substance and Sexual Activity   Alcohol use: Yes    Comment: once every 2 weeks, occasionally >4   Drug use: No   Sexual activity: Not Currently    Birth control/protection: Inserts    Comment: nuvaring  Other Topics Concern   Not on file  Social History Narrative   Works as an Nutritional therapist at [employer]   One Daughter - Sherol Dade - 66 years old now   Family support: dad is nearby and watches daughter   Enjoys: works a lot, spend time with friends, hiking, corn-hole, beach   Exercise: gym  regularly - 1-2 since the gym closed   Diet: pretty healthy overall   Social Determinants of Health   Financial Resource Strain: Low Risk  (03/06/2022)   Overall Financial Resource Strain (CARDIA)    Difficulty of Paying Living Expenses: Not hard at all  Food Insecurity: No Food Insecurity (03/06/2022)   Hunger Vital Sign    Worried About Running Out of Food in the Last Year: Never true    Ran Out of Food in the Last Year: Never true  Transportation Needs: No Transportation Needs (03/06/2022)   PRAPARE - Administrator, Civil Service (Medical): No    Lack of Transportation (Non-Medical): No  Physical Activity: Sufficiently Active (03/06/2022)   Exercise Vital Sign    Days of Exercise per Week: 5 days    Minutes of Exercise per Session: 30 min  Stress: Stress Concern Present (03/06/2022)   [employer] of Occupational Health - Occupational Stress Questionnaire    Feeling of Stress : To some extent  Social Connections: Moderately Isolated (03/06/2022)   Social Connection and Isolation Panel [NHANES]    Frequency of Communication with Friends and Family: More than three times a week    Frequency of Social Gatherings with Friends and Family: More than three times a week    Attends Religious Services: More than 4 times per year    Active Member of [employer] or Organizations: No    Attends Banker Meetings: Never    Marital Status: Separated  Intimate Partner Violence: Not At Risk (03/06/2022)   Humiliation, Afraid, Rape, and Kick questionnaire    Fear of Current or Ex-Partner: No    Emotionally Abused: No    Physically Abused: No    Sexually Abused: No    Review of Systems  Constitutional:  Negative for chills, diaphoresis, fever and weight loss.  Respiratory:  Negative for cough, shortness of breath and wheezing.   Cardiovascular:  Negative for chest pain, palpitations, orthopnea and leg swelling.  Neurological: Negative.   Endo/Heme/Allergies:  Negative.   Psychiatric/Behavioral: Negative.        Objective    BP 113/71  Pulse 62   Temp 98.1 F (36.7 C) (Oral)   Ht 5\' 4"  (1.626 m)   Wt 175 lb (79.4 kg)   SpO2 99%   BMI 30.04 kg/m   Physical Exam Vitals and nursing note reviewed.  Constitutional:      General: She is awake. She is not in acute distress.    Appearance: She is well-developed, well-groomed and overweight. She is not ill-appearing or toxic-appearing.  HENT:     Head: Normocephalic.     Right Ear: Hearing normal.     Left Ear: Hearing normal.  Eyes:     General: Lids are normal.        Right eye: No discharge.        Left eye: No discharge.     Conjunctiva/sclera: Conjunctivae normal.     Pupils: Pupils are equal, round, and reactive to light.  Neck:     Thyroid: No thyromegaly.     Vascular: No carotid bruit.  Cardiovascular:     Rate and Rhythm: Normal rate and regular rhythm.     Heart sounds: Normal heart sounds. No murmur heard.    No gallop.  Pulmonary:     Effort: Pulmonary effort is normal. No accessory muscle usage or respiratory distress.     Breath sounds: Normal breath sounds.  Abdominal:     General: Bowel sounds are normal.     Palpations: Abdomen is soft. There is no hepatomegaly or splenomegaly.  Musculoskeletal:     Cervical back: Normal range of motion and neck supple.     Right lower leg: No edema.     Left lower leg: No edema.  Lymphadenopathy:     Cervical: No cervical adenopathy.  Skin:    General: Skin is warm and dry.  Neurological:     Mental Status: She is alert and oriented to person, place, and time.  Psychiatric:        Attention and Perception: Attention normal.        Mood and Affect: Mood normal.        Speech: Speech normal.        Behavior: Behavior normal. Behavior is cooperative.        Thought Content: Thought content normal.    Last CBC Lab Results  Component Value Date   WBC 7.5 09/03/2021   HGB 12.4 09/03/2021   HCT 37.2 09/03/2021   MCV  88.1 09/03/2021   RDW 12.5 09/03/2021   PLT 272.0 09/03/2021   Last metabolic panel Lab Results  Component Value Date   GLUCOSE 93 09/03/2021   NA 137 09/03/2021   K 3.9 09/03/2021   CL 106 09/03/2021   CO2 23 09/03/2021   BUN 13 09/03/2021   CREATININE 0.81 09/03/2021   CALCIUM 9.3 09/03/2021   PROT 6.3 09/03/2021   ALBUMIN 3.8 09/03/2021   BILITOT 0.3 09/03/2021   ALKPHOS 42 09/03/2021   AST 15 09/03/2021   ALT 18 09/03/2021   Last lipids Lab Results  Component Value Date   CHOL 193 09/03/2021   HDL 73.80 09/03/2021   LDLCALC 97 09/03/2021   TRIG 110.0 09/03/2021   CHOLHDL 3 09/03/2021   Last thyroid functions Lab Results  Component Value Date   TSH 11.93 (H) 09/03/2021   Last vitamin D Lab Results  Component Value Date   VD25OH 27.93 (L) 09/03/2021    Assessment & Plan:   Problem List Items Addressed This Visit       Cardiovascular and Mediastinum  Migraines    Chronic, stable.  Continue collaboration with neurology and current regimen as ordered by them.  Recent note reviewed.      Relevant Orders   CBC with Differential/Platelet   Comprehensive metabolic panel     Other   Body mass index 30.0-30.9, adult    BMI 30.04. Recommended eating smaller high protein, low fat meals more frequently and exercising 30 mins a day 5 times a week with a goal of 10-15lb weight loss in the next 3 months. Patient voiced their understanding and motivation to adhere to these recommendations.  Is currently on Topamax which can offer weight loss assist, however she is interested in StrongWegovy -- discussed checking thyroid first to assess for other causes of weight gain recently.       Depression, major, single episode, moderate (HCC) - Primary    Chronic, ongoing.  Denies SI/HI.  At this time continue therapy sessions and consider starting medication in future as needed -- would be cautious with Wellbutrin if used due to her anxiety level.  Discussed SSRIs.         Elevated TSH    Noted on past labs and took Synthroid for period -- recheck labs today to include TSH, Free T4, and thyroid antibody.  Consider ultrasound if elevations continue and restart medication.      Relevant Orders   T4, free   Thyroid peroxidase antibody   TSH   Generalized anxiety disorder    Chronic, ongoing.  Denies SI/HI.  At this time continue therapy sessions and consider starting medication in future as needed -- would be cautious with Wellbutrin if used due to her anxiety level.  Discussed SSRIs.  Main trigger was teen daughter and this is improving at this time.      Vitamin D deficiency    Ongoing, is taking supplement.  Continue this and check Vit D level today.      Relevant Orders   VITAMIN D 25 Hydroxy (Vit-D Deficiency, Fractures)   Other Visit Diagnoses     Need for hepatitis C screening test       Hep C screen on labs today per guidelines for one time screening, discussed with patient.   Relevant Orders   Hepatitis C antibody   Encounter for screening for HIV       HIV screen on labs today per guidelines for one time screening, discussed with patient.   Relevant Orders   HIV Antibody (routine testing w rflx)   Encounter to establish care       Introduced to clinic setting and provider.       Return in about 6 weeks (around 04/17/2022) for Thyroid Check and Weight check.   Marjie SkiffJOLENE T Tiphany Fayson, NP

## 2022-03-06 NOTE — Assessment & Plan Note (Signed)
Chronic, ongoing.  Denies SI/HI.  At this time continue therapy sessions and consider starting medication in future as needed -- would be cautious with Wellbutrin if used due to her anxiety level.  Discussed SSRIs.

## 2022-03-06 NOTE — Assessment & Plan Note (Addendum)
BMI 30.04. Recommended eating smaller high protein, low fat meals more frequently and exercising 30 mins a day 5 times a week with a goal of 10-15lb weight loss in the next 3 months. Patient voiced their understanding and motivation to adhere to these recommendations.  Is currently on Topamax which can offer weight loss assist, however she is interested in [place] -- discussed checking thyroid first to assess for other causes of weight gain recently.

## 2022-03-06 NOTE — Assessment & Plan Note (Signed)
Chronic, stable.  Continue collaboration with neurology and current regimen as ordered by them.  Recent note reviewed.

## 2022-03-06 NOTE — Assessment & Plan Note (Signed)
Chronic, ongoing.  Denies SI/HI.  At this time continue therapy sessions and consider starting medication in future as needed -- would be cautious with Wellbutrin if used due to her anxiety level.  Discussed SSRIs.  Main trigger was teen daughter and this is improving at this time.

## 2022-03-06 NOTE — Assessment & Plan Note (Signed)
Noted on past labs and took Synthroid for period -- recheck labs today to include TSH, Free T4, and thyroid antibody.  Consider ultrasound if elevations continue and restart medication.

## 2022-03-06 NOTE — Assessment & Plan Note (Signed)
Ongoing, is taking supplement.  Continue this and check Vit D level today.

## 2022-03-07 ENCOUNTER — Encounter: Payer: Self-pay | Admitting: Nurse Practitioner

## 2022-03-07 ENCOUNTER — Other Ambulatory Visit: Payer: Self-pay

## 2022-03-07 LAB — CBC WITH DIFFERENTIAL/PLATELET
Basophils Absolute: 0.1 10*3/uL (ref 0.0–0.2)
Basos: 1 %
EOS (ABSOLUTE): 0.2 10*3/uL (ref 0.0–0.4)
Eos: 3 %
Hematocrit: 42.5 % (ref 34.0–46.6)
Hemoglobin: 13.3 g/dL (ref 11.1–15.9)
Immature Grans (Abs): 0 10*3/uL (ref 0.0–0.1)
Immature Granulocytes: 0 %
Lymphocytes Absolute: 2.3 10*3/uL (ref 0.7–3.1)
Lymphs: 29 %
MCH: 28.2 pg (ref 26.6–33.0)
MCHC: 31.3 g/dL — ABNORMAL LOW (ref 31.5–35.7)
MCV: 90 fL (ref 79–97)
Monocytes Absolute: 0.5 10*3/uL (ref 0.1–0.9)
Monocytes: 6 %
Neutrophils Absolute: 4.8 10*3/uL (ref 1.4–7.0)
Neutrophils: 61 %
Platelets: 347 10*3/uL (ref 150–450)
RBC: 4.71 x10E6/uL (ref 3.77–5.28)
RDW: 12 % (ref 11.7–15.4)
WBC: 7.9 10*3/uL (ref 3.4–10.8)

## 2022-03-07 LAB — HEPATITIS C ANTIBODY: Hep C Virus Ab: NONREACTIVE

## 2022-03-07 LAB — COMPREHENSIVE METABOLIC PANEL
ALT: 15 IU/L (ref 0–32)
AST: 16 IU/L (ref 0–40)
Albumin/Globulin Ratio: 1.6 (ref 1.2–2.2)
Albumin: 4.2 g/dL (ref 3.9–4.9)
Alkaline Phosphatase: 58 IU/L (ref 44–121)
BUN/Creatinine Ratio: 17 (ref 9–23)
BUN: 13 mg/dL (ref 6–20)
Bilirubin Total: 0.4 mg/dL (ref 0.0–1.2)
CO2: 18 mmol/L — ABNORMAL LOW (ref 20–29)
Calcium: 9.5 mg/dL (ref 8.7–10.2)
Chloride: 103 mmol/L (ref 96–106)
Creatinine, Ser: 0.76 mg/dL (ref 0.57–1.00)
Globulin, Total: 2.6 g/dL (ref 1.5–4.5)
Glucose: 81 mg/dL (ref 70–99)
Potassium: 3.9 mmol/L (ref 3.5–5.2)
Sodium: 139 mmol/L (ref 134–144)
Total Protein: 6.8 g/dL (ref 6.0–8.5)
eGFR: 105 mL/min/{1.73_m2} (ref >59)

## 2022-03-07 LAB — HIV ANTIBODY (ROUTINE TESTING W REFLEX): HIV Screen 4th Generation wRfx: NONREACTIVE

## 2022-03-07 LAB — TSH: TSH: 12.4 u[IU]/mL — ABNORMAL HIGH (ref 0.450–4.500)

## 2022-03-07 LAB — VITAMIN D 25 HYDROXY (VIT D DEFICIENCY, FRACTURES): Vit D, 25-Hydroxy: 31.3 ng/mL (ref 30.0–100.0)

## 2022-03-07 LAB — T4, FREE: Free T4: 1.02 ng/dL (ref 0.82–1.77)

## 2022-03-07 LAB — THYROID PEROXIDASE ANTIBODY: Thyroperoxidase Ab SerPl-aCnc: 354 IU/mL — ABNORMAL HIGH (ref 0–34)

## 2022-03-07 MED ORDER — LEVOTHYROXINE SODIUM 25 MCG PO TABS
25.0000 ug | ORAL_TABLET | Freq: Every day | ORAL | 3 refills | Status: DC
  Filled 2022-03-07 – 2022-04-03 (×2): qty 90, 90d supply, fill #0

## 2022-03-07 NOTE — Addendum Note (Signed)
Addended by: Marnee Guarneri T on: 03/07/2022 12:14 PM   Modules accepted: Orders

## 2022-03-07 NOTE — Progress Notes (Signed)
Contacted via [place] afternoon [name], your labs have returned and overall they look great with exception of thyroid.  Your thyroid antibody is quite elevated, leaning you more towards Hashimoto's thyroiditis.  TSH remains elevated in 12 range, but Free T4 stable.  I do recommend we start a low dose of Levothyroxine and obtain a thyroid ultrasound to further assess thyroid. I will order both of these.  Remainder of labs look fantastic.  I do suspect the thyroid a a big part of the problem with your sweating and weight, so if we can get this under control that will help a lot. Any questions? Keep being amazing!!  Thank you for allowing me to participate in your care.  I appreciate you. Kindest regards, Antionette Luster

## 2022-03-17 ENCOUNTER — Ambulatory Visit
Admission: RE | Admit: 2022-03-17 | Discharge: 2022-03-17 | Disposition: A | Discharge status: Home / Self Care | Payer: No Typology Code available for payment source | Source: Ambulatory Visit | Attending: Nurse Practitioner | Admitting: Nurse Practitioner

## 2022-03-17 DIAGNOSIS — E063 Autoimmune thyroiditis: Secondary | ICD-10-CM | POA: Insufficient documentation

## 2022-03-17 NOTE — Progress Notes (Signed)
Contacted via MyChart   Good afternoon, thyroid imaging does show thyroid gland is a little enlarged which goes along with Hashimoto's.  Continue Levothyroxine.  No suspicious nodules noted!!  Great news!!  Any questions? Keep being stellar!!  Thank you for allowing me to participate in your care.  I appreciate you. Kindest regards, Jacarius Handel

## 2022-03-25 ENCOUNTER — Other Ambulatory Visit: Payer: Self-pay

## 2022-04-03 ENCOUNTER — Other Ambulatory Visit: Payer: Self-pay

## 2022-04-03 ENCOUNTER — Other Ambulatory Visit: Payer: Self-pay | Admitting: Nurse Practitioner

## 2022-04-03 MED ORDER — SF 5000 PLUS 1.1 % DT CREA
1.0000 | TOPICAL_CREAM | Freq: Every day | DENTAL | 11 refills | Status: AC
  Filled 2022-04-03: qty 51, 30d supply, fill #0

## 2022-04-04 ENCOUNTER — Other Ambulatory Visit: Payer: Self-pay

## 2022-04-20 NOTE — Patient Instructions (Signed)

## 2022-04-22 ENCOUNTER — Ambulatory Visit (INDEPENDENT_AMBULATORY_CARE_PROVIDER_SITE_OTHER): Payer: No Typology Code available for payment source | Admitting: Nurse Practitioner

## 2022-04-22 ENCOUNTER — Encounter: Payer: Self-pay | Admitting: Nurse Practitioner

## 2022-04-22 VITALS — BP 129/85 | HR 58 | Temp 97.7°F | Ht 64.02 in | Wt 175.6 lb

## 2022-04-22 DIAGNOSIS — E063 Autoimmune thyroiditis: Secondary | ICD-10-CM

## 2022-04-22 DIAGNOSIS — Z6830 Body mass index (BMI) 30.0-30.9, adult: Secondary | ICD-10-CM

## 2022-04-22 NOTE — Assessment & Plan Note (Signed)
Ongoing with medication started on 03/07/22.  Continue current dosing and adjust as needed.  Thyroid labs today.

## 2022-04-22 NOTE — Assessment & Plan Note (Signed)
BMI 30.13 today.  Discussed with patient, will consider adding on Zepbound or Wegovy next visit if available.  No family history of thyroid cancer (MTC, MEN 2, thyroid cell tumors) or pancreatitis.  Recommended eating smaller high protein, low fat meals more frequently and exercising 30 mins a day 5 times a week with a goal of 10-15lb weight loss in the next 3 months. Patient voiced their understanding and motivation to adhere to these recommendations.

## 2022-04-22 NOTE — Progress Notes (Signed)
BP 129/85   Pulse (!) 58   Temp 97.7 F (36.5 C) (Oral)   Ht 5' 4.02" (1.626 m)   Wt 175 lb 9.6 oz (79.7 kg)   SpO2 98%   BMI 30.13 kg/m    Subjective:    Patient ID: Tomeca Helm, female    DOB: [birth year], 36 y.o.   MRN: [id number]  HPI: Krisandra Bueno is a 36 y.o. female  Chief Complaint  Patient presents with   Thyroid check   Weight Check   HYPOTHYROIDISM Currently taking Levothyroxine 25 MCG, tolerating well without ADR.  Not noticing much difference in fatigue and appetite.  Continues to take daily Vitamin D and B12. Thyroid control status:stable Satisfied with current treatment? no Medication side effects: no Medication compliance: good compliance Etiology of hypothyroidism: Hashimoto's Recent dose adjustment: started medication Fatigue: yes Cold intolerance: yes Heat intolerance: yes -- sweats a lot Weight gain: no Weight loss: no Constipation:  occasional -- takes magnesium supplement Diarrhea/loose stools: no Palpitations: no Lower extremity edema: no Anxiety/depressed mood: yes     04/22/2022    8:30 AM 03/06/2022    8:48 AM 10/10/2021   10:39 AM 09/03/2021   10:20 AM 09/28/2018    9:21 AM  Depression screen PHQ 2/9  Decreased Interest _0 0  Down, Depressed, Hopeless _1 0  PHQ - 2 Score _2 0  Altered sleeping _3 Tired, decreased energy _4 Change in appetite 2 2 0 3 0  Feeling bad or failure about yourself  _5 0  Trouble concentrating 0 _6 0  Moving slowly or fidgety/restless 0 2 0 2 0  Suicidal thoughts 0 0 0 0 0  PHQ-9 Score _7 Difficult doing work/chores  Somewhat difficult Somewhat difficult Very difficult Not difficult at all       04/22/2022    8:30 AM 03/06/2022    8:49 AM 10/10/2021   10:40 AM 09/03/2021   10:21 AM  GAD 7 : Generalized Anxiety Score  Nervous, Anxious, on Edge _8 Control/stop worrying 0 _9 Worry too much - different things _10 Trouble relaxing _11 Restless _12 Easily annoyed or irritable _13 Afraid - awful might happen 0 _14 Total GAD 7 Score _15 Anxiety Difficulty Somewhat difficult Somewhat difficult Very difficult Somewhat difficult   Relevant past medical, surgical, family and social history reviewed and updated as indicated. Interim medical history since our last visit reviewed. Allergies and medications reviewed and updated.  Review of Systems  Constitutional:  Positive for fatigue. Negative for activity change, appetite change, diaphoresis and fever.  Respiratory:  Negative for cough, chest tightness and shortness of breath.   Cardiovascular:  Negative for chest pain, palpitations and leg swelling.  Gastrointestinal:  Positive for constipation. Negative for abdominal pain, diarrhea, nausea and vomiting.  Endocrine: Positive for cold intolerance and heat intolerance. Negative for polydipsia, polyphagia and polyuria.  Neurological:  Negative for dizziness, syncope, weakness, light-headedness, numbness and headaches.  Psychiatric/Behavioral: Negative.      Per HPI unless specifically indicated above     Objective:    BP 129/85   Pulse (!) 58   Temp 97.7 F (36.5 C) (Oral)  Ht 5' 4.02" (1.626 m)   Wt 175 lb 9.6 oz (79.7 kg)   SpO2 98%   BMI 30.13 kg/m   Wt Readings from Last 3 Encounters:  04/22/22 175 lb 9.6 oz (79.7 kg)  03/06/22 175 lb (79.4 kg)  10/24/21 160 lb (72.6 kg)    Physical Exam Vitals and nursing note reviewed.  Constitutional:      General: She is awake. She is not in acute distress.    Appearance: She is well-developed, well-groomed and overweight. She is not ill-appearing or toxic-appearing.  HENT:     Head: Normocephalic.     Right Ear: Hearing normal.     Left Ear: Hearing normal.  Eyes:     General: Lids are normal.        Right eye: No discharge.        Left eye: No discharge.     Conjunctiva/sclera: Conjunctivae normal.     Pupils:  Pupils are equal, round, and reactive to light.  Neck:     Thyroid: No thyromegaly.     Vascular: No carotid bruit.  Cardiovascular:     Rate and Rhythm: Normal rate and regular rhythm.     Heart sounds: Normal heart sounds. No murmur heard.    No gallop.  Pulmonary:     Effort: Pulmonary effort is normal. No accessory muscle usage or respiratory distress.     Breath sounds: Normal breath sounds.  Abdominal:     General: Bowel sounds are normal.     Palpations: Abdomen is soft.  Musculoskeletal:     Cervical back: Normal range of motion and neck supple.     Right lower leg: No edema.     Left lower leg: No edema.  Lymphadenopathy:     Cervical: No cervical adenopathy.  Skin:    General: Skin is warm and dry.  Neurological:     Mental Status: She is alert and oriented to person, place, and time.  Psychiatric:        Attention and Perception: Attention normal.        Mood and Affect: Mood normal.        Speech: Speech normal.        Behavior: Behavior normal. Behavior is cooperative.        Thought Content: Thought content normal.     Results for orders placed or performed in visit on 03/06/22  T4, free  Result Value Ref Range   Free T4 1.02 0.82 - 1.77 ng/dL  Thyroid peroxidase antibody  Result Value Ref Range   Thyroperoxidase Ab SerPl-aCnc 354 (H) 0 - 34 IU/mL  TSH  Result Value Ref Range   TSH 12.400 (H) 0.450 - 4.500 uIU/mL  CBC with Differential/Platelet  Result Value Ref Range   WBC 7.9 3.4 - 10.8 x10E3/uL   RBC 4.71 3.77 - 5.28 x10E6/uL   Hemoglobin 13.3 11.1 - 15.9 g/dL   Hematocrit 42.5 34.0 - 46.6 %   MCV 90 79 - 97 fL   MCH 28.2 26.6 - 33.0 pg   MCHC 31.3 (L) 31.5 - 35.7 g/dL   RDW 12.0 11.7 - 15.4 %   Platelets 347 150 - 450 x10E3/uL   Neutrophils 61 Not Estab. %   Lymphs 29 Not Estab. %   Monocytes 6 Not Estab. %   Eos 3 Not Estab. %   Basos 1 Not Estab. %   Neutrophils Absolute 4.8 1.4 - 7.0 x10E3/uL   Lymphocytes Absolute 2.3 0.7 - 3.1  x10E3/uL   Monocytes Absolute 0.5 0.1 - 0.9 x10E3/uL   EOS (ABSOLUTE) 0.2 0.0 - 0.4 x10E3/uL   Basophils Absolute 0.1 0.0 - 0.2 x10E3/uL   Immature Granulocytes 0 Not Estab. %   Immature Grans (Abs) 0.0 0.0 - 0.1 x10E3/uL  Comprehensive metabolic panel  Result Value Ref Range   Glucose 81 70 - 99 mg/dL   BUN 13 6 - 20 mg/dL   Creatinine, Ser 0.76 0.57 - 1.00 mg/dL   eGFR 105 >59 mL/min/1.73   BUN/Creatinine Ratio 17 9 - 23   Sodium 139 134 - 144 mmol/L   Potassium 3.9 3.5 - 5.2 mmol/L   Chloride 103 96 - 106 mmol/L   CO2 18 (L) 20 - 29 mmol/L   Calcium 9.5 8.7 - 10.2 mg/dL   Total Protein 6.8 6.0 - 8.5 g/dL   Albumin 4.2 3.9 - 4.9 g/dL   Globulin, Total 2.6 1.5 - 4.5 g/dL   Albumin/Globulin Ratio 1.6 1.2 - 2.2   Bilirubin Total 0.4 0.0 - 1.2 mg/dL   Alkaline Phosphatase 58 44 - 121 IU/L   AST 16 0 - 40 IU/L   ALT 15 0 - 32 IU/L  VITAMIN D 25 Hydroxy (Vit-D Deficiency, Fractures)  Result Value Ref Range   Vit D, 25-Hydroxy 31.3 30.0 - 100.0 ng/mL  HIV Antibody (routine testing w rflx)  Result Value Ref Range   HIV Screen 4th Generation wRfx Non Reactive Non Reactive  Hepatitis C antibody  Result Value Ref Range   Hep C Virus Ab Non Reactive Non Reactive      Assessment & Plan:   Problem List Items Addressed This Visit       Endocrine   Hashimoto's thyroiditis - Primary    Ongoing with medication started on 03/07/22.  Continue current dosing and adjust as needed.  Thyroid labs today.        Relevant Orders   T4, free   TSH   T3, free     Other   Body mass index 30.0-30.9, adult    BMI 30.13 today.  Discussed with patient, will consider adding on Zepbound or Wegovy next visit if available.  No family history of thyroid cancer (MTC, MEN 2, thyroid cell tumors) or pancreatitis.  Recommended eating smaller high protein, low fat meals more frequently and exercising 30 mins a day 5 times a week with a goal of 10-15lb weight loss in the next 3 months. Patient voiced their  understanding and motivation to adhere to these recommendations.         Follow up plan: Return in about 4 weeks (around 05/20/2022) for WEIGHT CHECK.

## 2022-04-23 ENCOUNTER — Ambulatory Visit: Payer: Self-pay

## 2022-04-23 ENCOUNTER — Other Ambulatory Visit: Payer: Self-pay

## 2022-04-23 ENCOUNTER — Other Ambulatory Visit: Payer: Self-pay | Admitting: Nurse Practitioner

## 2022-04-23 ENCOUNTER — Encounter: Payer: Self-pay | Admitting: Nurse Practitioner

## 2022-04-23 DIAGNOSIS — E063 Autoimmune thyroiditis: Secondary | ICD-10-CM

## 2022-04-23 LAB — T4, FREE: Free T4: 1.02 ng/dL (ref 0.82–1.77)

## 2022-04-23 LAB — T3, FREE: T3, Free: 3 pg/mL (ref 2.0–4.4)

## 2022-04-23 LAB — TSH: TSH: 18 u[IU]/mL — ABNORMAL HIGH (ref 0.450–4.500)

## 2022-04-23 MED ORDER — PREDNISONE 10 MG PO TABS
ORAL_TABLET | ORAL | 0 refills | Status: AC
  Filled 2022-04-23: qty 42, 12d supply, fill #0

## 2022-04-23 MED ORDER — LEVOTHYROXINE SODIUM 50 MCG PO TABS
50.0000 ug | ORAL_TABLET | Freq: Every day | ORAL | 3 refills | Status: DC
  Filled 2022-04-23: qty 60, 60d supply, fill #0

## 2022-04-23 NOTE — Telephone Encounter (Signed)
Chief Complaint: lip swelling Symptoms: Lip really swollen, pain 3/10, no itching Frequency: onset this morning Pertinent Negatives: Patient denies other symptoms Disposition: [] ED /[] Urgent Care (no appt availability in office) / [] Appointment(In office/virtual)/ []  [facility]/ [] Home Care/ [] Refused Recommended Disposition /[] [facility]/ []  Follow-up with PCP Additional Notes: Patient called  and says she's taken benadryl and pepcid for a rash under her arm. She says she woke up this morning with lip swelling and doesn't know where it is coming from. She started on zinc and vitamin B12 last week and increased her dose of levothyroxine to  mg, not sure if either caused the lip swelling. She says she is wanting to receive prednisone today for her lip. She says she sent a MyChart message to [facility] and no response. While we were on the phone, the MyChart message came through from provider that Prednisone was sent to the pharmacy, patient made aware of dosage and instructions.   Answer Assessment - Initial Assessment Questions 1. ONSET: "When did the swelling start?" (e.g., minutes, hours, days)     Woke up this morning 2. SEVERITY: "How swollen is it?"     Very swollen 3. ITCHING: "Is there any itching?" If Yes, ask: "How much?"   (Scale 1-10; mild, moderate or severe)     No 4. PAIN: "Is the swelling painful to touch?" If Yes, ask: "How painful is it?"   (Scale 1-10; mild, moderate or severe)     3 5. CAUSE: "What do you think is causing the lip swelling?"     No idea 6. OTHER SYMPTOMS: "Do you have any other symptoms?" (e.g., toothache)     Had a rash, took benadryl and pepcid  and it's gone  Protocols used: Lip Swelling-A-AH

## 2022-04-23 NOTE — Progress Notes (Signed)
Contacted via MyChart  -- need lab only visit in 6 weeks please   Good morning Christy Whitehead, we are still not at goal for the TSH but we are trending down.  I am going to increase your Levothyroxine to 50 MCG and have you return in 6 weeks for lab check only.  Will have staff call to schedule this.  It explains why you are not feeling 100% yet.  We will get you there.  If you have 25 MCG left you can start taking 2 of those and I will send in 50 MCG pill.  Any questions? Keep being amazing!!  Thank you for allowing me to participate in your care.  I appreciate you. Kindest regards, Sayda Grable

## 2022-04-24 NOTE — Progress Notes (Signed)
Called patient for 6 week labs, her appointment is set for Jan 11 @ 9:00 am.

## 2022-05-09 ENCOUNTER — Other Ambulatory Visit: Payer: Self-pay

## 2022-05-18 NOTE — Patient Instructions (Signed)

## 2022-05-20 ENCOUNTER — Ambulatory Visit (INDEPENDENT_AMBULATORY_CARE_PROVIDER_SITE_OTHER): Payer: No Typology Code available for payment source | Admitting: Nurse Practitioner

## 2022-05-20 ENCOUNTER — Encounter: Payer: Self-pay | Admitting: Nurse Practitioner

## 2022-05-20 ENCOUNTER — Other Ambulatory Visit: Payer: Self-pay

## 2022-05-20 VITALS — BP 110/75 | HR 56 | Temp 97.9°F | Ht 64.02 in | Wt 173.5 lb

## 2022-05-20 DIAGNOSIS — E063 Autoimmune thyroiditis: Secondary | ICD-10-CM | POA: Diagnosis not present

## 2022-05-20 DIAGNOSIS — Z6830 Body mass index (BMI) 30.0-30.9, adult: Secondary | ICD-10-CM | POA: Diagnosis not present

## 2022-05-20 MED ORDER — ZEPBOUND 2.5 MG/0.5ML ~~LOC~~ SOAJ
2.5000 mg | SUBCUTANEOUS | 2 refills | Status: DC
  Filled 2022-05-20 – 2022-05-31 (×2): qty 2, 28d supply, fill #0

## 2022-05-20 NOTE — Assessment & Plan Note (Signed)
Chronic, ongoing with medication started on 03/07/22.  Some improvement in symptoms presenting.  Continue current dosing and adjust as needed.  Thyroid labs today.

## 2022-05-20 NOTE — Assessment & Plan Note (Signed)
BMI 30.  Will see if we can get Zepbound covered, discussed with patient and educated on this -- is aware we may not be able to get coverage dependent on insurance.  No family history of thyroid cancer (MTC, MEN 2, thyroid cell tumors) or pancreatitis.  Recommended eating smaller high protein, low fat meals more frequently and exercising 30 mins a day 5 times a week with a goal of 10-15lb weight loss in the next 3 months. Patient voiced their understanding and motivation to adhere to these recommendations.

## 2022-05-20 NOTE — Progress Notes (Signed)
BP 110/75   Pulse (!) 56   Temp 97.9 F (36.6 C) (Oral)   Ht 5' 4.02" (1.626 m)   Wt 173 lb 8 oz (78.7 kg)   SpO2 99%   BMI 29.77 kg/m    Subjective:    Patient ID: Christy Whitehead, female    DOB: 31-Mar-1986, 36 y.o.   MRN: [id number]  HPI: Christy Whitehead is a 36 y.o. female  Chief Complaint  Patient presents with   Weight Check   HYPOTHYROIDISM Currently taking Levothyroxine 50 MCG, tolerating well without ADR.   Thyroid control status:stable Satisfied with current treatment? no Medication side effects: no Medication compliance: good compliance Etiology of hypothyroidism: Hashimoto's Recent dose adjustment:04/23/22 Fatigue: yes, a little bit better Cold intolerance: yes Heat intolerance: improving Weight gain: no Weight loss: no Constipation:  occasional -- takes magnesium supplement Diarrhea/loose stools: no Palpitations: no Lower extremity edema: no Anxiety/depressed mood: yes -- improvement    05/20/2022    2:17 PM 04/22/2022    8:30 AM 03/06/2022    8:48 AM 10/10/2021   10:39 AM 09/03/2021   10:20 AM  Depression screen PHQ 2/9  Decreased Interest 1 2 1 1 2   Down, Depressed, Hopeless 0 1 1 1 1   PHQ - 2 Score 1 3 2 2 3   Altered sleeping 2 2 3 2 3   Tired, decreased energy 1 3 2 2 3   Change in appetite 1 2 2  0 3  Feeling bad or failure about yourself  0 1 1 2 2   Trouble concentrating 0 0 1 1 2   Moving slowly or fidgety/restless 0 0 2 0 2  Suicidal thoughts 0 0 0 0 0  PHQ-9 Score 5 11 13 9 18   Difficult doing work/chores Not difficult at all  Somewhat difficult Somewhat difficult Very difficult       05/20/2022    2:17 PM 04/22/2022    8:30 AM 03/06/2022    8:49 AM 10/10/2021   10:40 AM  GAD 7 : Generalized Anxiety Score  Nervous, Anxious, on Edge 0 1 1 2   Control/stop worrying 0 0 1 2  Worry too much - different things 1 1 1 2   Trouble relaxing 0 1 2 1   Restless 0 1 1 2   Easily annoyed or irritable 2 1 2 3   Afraid - awful  might happen 0 0 1 2  Total GAD 7 Score 3 5 9 14   Anxiety Difficulty Not difficult at all Somewhat difficult Somewhat difficult Very difficult   Relevant past medical, surgical, family and social history reviewed and updated as indicated. Interim medical history since our last visit reviewed. Allergies and medications reviewed and updated.  Review of Systems  Constitutional:  Positive for fatigue. Negative for activity change, appetite change, diaphoresis and fever.  Respiratory:  Negative for cough, chest tightness and shortness of breath.   Cardiovascular:  Negative for chest pain, palpitations and leg swelling.  Gastrointestinal:  Positive for constipation. Negative for abdominal pain, diarrhea, nausea and vomiting.  Endocrine: Positive for cold intolerance. Negative for heat intolerance, polydipsia, polyphagia and polyuria.  Neurological:  Negative for dizziness, syncope, weakness, light-headedness, numbness and headaches.  Psychiatric/Behavioral: Negative.     Per HPI unless specifically indicated above     Objective:    BP 110/75   Pulse (!) 56   Temp 97.9 F (36.6 C) (Oral)   Ht 5' 4.02" (1.626 m)   Wt 173 lb 8 oz (78.7 kg)   SpO2 99%  BMI 29.77 kg/m   Wt Readings from Last 3 Encounters:  05/20/22 173 lb 8 oz (78.7 kg)  04/22/22 175 lb 9.6 oz (79.7 kg)  03/06/22 175 lb (79.4 kg)    Physical Exam Vitals and nursing note reviewed.  Constitutional:      General: She is awake. She is not in acute distress.    Appearance: She is well-developed, well-groomed and overweight. She is not ill-appearing or toxic-appearing.  HENT:     Head: Normocephalic.     Right Ear: Hearing normal.     Left Ear: Hearing normal.  Eyes:     General: Lids are normal.        Right eye: No discharge.        Left eye: No discharge.     Conjunctiva/sclera: Conjunctivae normal.     Pupils: Pupils are equal, round, and reactive to light.  Neck:     Thyroid: No thyromegaly.     Vascular: No  carotid bruit.  Cardiovascular:     Rate and Rhythm: Normal rate and regular rhythm.     Heart sounds: Normal heart sounds. No murmur heard.    No gallop.  Pulmonary:     Effort: Pulmonary effort is normal. No accessory muscle usage or respiratory distress.     Breath sounds: Normal breath sounds.  Abdominal:     General: Bowel sounds are normal.     Palpations: Abdomen is soft.  Musculoskeletal:     Cervical back: Normal range of motion and neck supple.     Right lower leg: No edema.     Left lower leg: No edema.  Lymphadenopathy:     Cervical: No cervical adenopathy.  Skin:    General: Skin is warm and dry.  Neurological:     Mental Status: She is alert and oriented to person, place, and time.  Psychiatric:        Attention and Perception: Attention normal.        Mood and Affect: Mood normal.        Speech: Speech normal.        Behavior: Behavior normal. Behavior is cooperative.        Thought Content: Thought content normal.    Results for orders placed or performed in visit on 04/22/22  T4, free  Result Value Ref Range   Free T4 1.02 0.82 - 1.77 ng/dL  TSH  Result Value Ref Range   TSH 18.000 (H) 0.450 - 4.500 uIU/mL  T3, free  Result Value Ref Range   T3, Free 3.0 2.0 - 4.4 pg/mL      Assessment & Plan:   Problem List Items Addressed This Visit       Endocrine   Hashimoto's thyroiditis - Primary    Chronic, ongoing with medication started on 03/07/22.  Some improvement in symptoms presenting.  Continue current dosing and adjust as needed.  Thyroid labs today.        Relevant Orders   T4, free   TSH     Other   Body mass index 30.0-30.9, adult    BMI 30.  Will see if we can get Zepbound covered, discussed with patient and educated on this -- is aware we may not be able to get coverage dependent on insurance.  No family history of thyroid cancer (MTC, MEN 2, thyroid cell tumors) or pancreatitis.  Recommended eating smaller high protein, low fat meals more  frequently and exercising 30 mins a day 5 times a week  with a goal of 10-15lb weight loss in the next 3 months. Patient voiced their understanding and motivation to adhere to these recommendations.         Follow up plan: Return in about 4 weeks (around 06/17/2022) for WEIGHT CHECK.

## 2022-05-21 ENCOUNTER — Other Ambulatory Visit: Payer: Self-pay

## 2022-05-21 ENCOUNTER — Other Ambulatory Visit: Payer: Self-pay | Admitting: Nurse Practitioner

## 2022-05-21 LAB — T4, FREE: Free T4: 1.21 ng/dL (ref 0.82–1.77)

## 2022-05-21 LAB — TSH: TSH: 5.45 u[IU]/mL — ABNORMAL HIGH (ref 0.450–4.500)

## 2022-05-21 MED ORDER — LEVOTHYROXINE SODIUM 75 MCG PO TABS
75.0000 ug | ORAL_TABLET | Freq: Every day | ORAL | 5 refills | Status: DC
  Filled 2022-05-21: qty 30, 30d supply, fill #0
  Filled 2022-06-18: qty 30, 30d supply, fill #1
  Filled 2022-07-23: qty 30, 30d supply, fill #2

## 2022-05-21 NOTE — Progress Notes (Signed)
Contacted via MyChart   Good morning Christy Whitehead, your labs have returned.  TSH is trending down towards normal at this time.  From 18.000 to now 5.450.  Free T4 remains stable.  I would recommend we trial going up to 75 MCG daily and recheck next visit -- if this places you too low then we may consider doing 50 MCG during week and 75 MCG on Saturday and Sunday.  We will see.  We are almost there.  Great news!!  Any questions? Keep being amazing!!  Thank you for allowing me to participate in your care.  I appreciate you. Kindest regards, Haseeb Fiallos

## 2022-05-30 ENCOUNTER — Telehealth: Payer: Self-pay | Admitting: *Deleted

## 2022-05-30 NOTE — Telephone Encounter (Signed)
Submitted PA Qul[employer] on [employer]. Key: [id number]. Received instant approval: "The request has been approved. The authorization is effective from 05/30/2022 to 05/30/2023, as long as the member is enrolled in their current health plan. The request was approved with a quantity restriction. This has been approved for a max daily dosage of 1. A written notification letter will follow with additional details."

## 2022-06-02 ENCOUNTER — Other Ambulatory Visit: Payer: Self-pay

## 2022-06-02 ENCOUNTER — Telehealth: Payer: Self-pay | Admitting: Nurse Practitioner

## 2022-06-02 NOTE — Telephone Encounter (Signed)
Patient called and stated that her pharmacy advised her that the prescription for tirzepatide ( Zepbound) needs a prior authorization. Please advise patient if prior Christy Whitehead can be completed, [phone].

## 2022-06-05 ENCOUNTER — Other Ambulatory Visit: Payer: Self-pay

## 2022-06-06 NOTE — Telephone Encounter (Signed)
Left message for patient that PA was denied, asked to return call for any questions

## 2022-06-15 NOTE — Patient Instructions (Signed)

## 2022-06-17 ENCOUNTER — Encounter: Payer: Self-pay | Admitting: Unknown Physician Specialty

## 2022-06-17 ENCOUNTER — Other Ambulatory Visit: Payer: Self-pay

## 2022-06-17 ENCOUNTER — Ambulatory Visit (INDEPENDENT_AMBULATORY_CARE_PROVIDER_SITE_OTHER): Payer: 59 | Admitting: Unknown Physician Specialty

## 2022-06-17 VITALS — BP 124/78 | HR 56 | Temp 98.1°F | Ht 64.02 in | Wt 178.5 lb

## 2022-06-17 DIAGNOSIS — Z6830 Body mass index (BMI) 30.0-30.9, adult: Secondary | ICD-10-CM | POA: Diagnosis not present

## 2022-06-17 DIAGNOSIS — E063 Autoimmune thyroiditis: Secondary | ICD-10-CM

## 2022-06-17 MED ORDER — WEGOVY 0.25 MG/0.5ML ~~LOC~~ SOAJ
0.2500 mg | SUBCUTANEOUS | 1 refills | Status: DC
  Filled 2022-06-17 – 2022-06-23 (×2): qty 2, 28d supply, fill #0

## 2022-06-17 NOTE — Progress Notes (Signed)
BP 124/78   Pulse (!) 56   Temp 98.1 F (36.7 C) (Oral)   Ht 5' 4.02" (1.626 m)   Wt 178 lb 8 oz (81 kg)   SpO2 99%   BMI 30.62 kg/m    Subjective:    Patient ID: Christy Whitehead, female    DOB: Aug 13, 1985, 37 y.o.   MRN: [id number]  HPI: Christy Whitehead is a 37 y.o. female  Chief Complaint  Patient presents with   Weight Check   Pt is here to f/u of thyroid dosing secondary to Hashimoto's.  She is also concerned about her weight gain since last last summer when she gained 30-40lbs.    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  Diet recall shows a reasonable diet.  Drinks mostly water.  Insurance would not cover Zebound.  Unable to obtain [facility]  Relevant past medical, surgical, family and social history reviewed and updated as indicated. Interim medical history since our last visit reviewed. Allergies and medications reviewed and updated.  Review of Systems  Per HPI unless specifically indicated above     Objective:    BP 124/78   Pulse (!) 56   Temp 98.1 F (36.7 C) (Oral)   Ht 5' 4.02" (1.626 m)   Wt 178 lb 8 oz (81 kg)   SpO2 99%   BMI 30.62 kg/m   Wt Readings from Last 3 Encounters:  06/17/22 178 lb 8 oz (81 kg)  05/20/22 173 lb 8 oz (78.7 kg)  04/22/22 175 lb 9.6 oz (79.7 kg)    Physical Exam Constitutional:      General: She is not in acute distress.    Appearance: Normal appearance. She is well-developed.  HENT:     Head: Normocephalic and atraumatic.  Eyes:     General: Lids are normal. No scleral icterus.       Right eye: No discharge.        Left eye: No discharge.     Conjunctiva/sclera: Conjunctivae normal.  Neck:     Vascular: No carotid bruit or JVD.  Cardiovascular:     Rate and Rhythm: Normal rate and regular rhythm.     Heart sounds: Normal heart sounds.  Pulmonary:     Effort: Pulmonary effort is normal. No respiratory distress.     Breath sounds: Normal breath sounds.  Abdominal:     Palpations: There is  no hepatomegaly or splenomegaly.  Musculoskeletal:        General: Normal range of motion.     Cervical back: Normal range of motion and neck supple.  Skin:    General: Skin is warm and dry.     Coloration: Skin is not pale.     Findings: No rash.  Neurological:     Mental Status: She is alert and oriented to person, place, and time.  Psychiatric:        Behavior: Behavior normal.        Thought Content: Thought content normal.        Judgment: Judgment normal.     Results for orders placed or performed in visit on 05/20/22  T4, free  Result Value Ref Range   Free T4 1.21 0.82 - 1.77 ng/dL  TSH  Result Value Ref Range   TSH 5.450 (H) 0.450 - 4.500 uIU/mL      Assessment & Plan:   Problem List Items Addressed This Visit       Unprioritized   Body mass index 30.0-30.9, adult  Pt would like to try a GLP1.  Not sure if Mancel Parsons will be covered but I will write.        Hashimoto's thyroiditis - Primary    TSH not to goal.  Will recheck today.        Relevant Orders   T4, free   TSH     Follow up plan: Return in about 6 weeks (around 07/29/2022).

## 2022-06-17 NOTE — Assessment & Plan Note (Signed)
TSH not to goal.  Will recheck today.

## 2022-06-17 NOTE — Assessment & Plan Note (Signed)
Pt would like to try a GLP1.  Not sure if Mancel Parsons will be covered but I will write.

## 2022-06-18 LAB — T4, FREE: Free T4: 1.22 ng/dL (ref 0.82–1.77)

## 2022-06-18 LAB — TSH: TSH: 3.89 u[IU]/mL (ref 0.450–4.500)

## 2022-06-19 ENCOUNTER — Other Ambulatory Visit: Payer: Self-pay

## 2022-06-20 ENCOUNTER — Telehealth: Payer: Self-pay

## 2022-06-20 NOTE — Telephone Encounter (Signed)
PA started for Wegovy through [employer]. Awaiting on determination ? ?

## 2022-06-23 ENCOUNTER — Telehealth: Payer: Self-pay

## 2022-06-23 ENCOUNTER — Other Ambulatory Visit (HOSPITAL_COMMUNITY)
Admission: RE | Admit: 2022-06-23 | Discharge: 2022-06-23 | Disposition: A | Discharge status: Home / Self Care | Payer: 59 | Source: Ambulatory Visit | Attending: Obstetrics and Gynecology | Admitting: Obstetrics and Gynecology

## 2022-06-23 ENCOUNTER — Encounter: Payer: Self-pay | Admitting: Obstetrics and Gynecology

## 2022-06-23 ENCOUNTER — Ambulatory Visit: Payer: 59 | Admitting: Obstetrics and Gynecology

## 2022-06-23 ENCOUNTER — Other Ambulatory Visit: Payer: Self-pay

## 2022-06-23 VITALS — BP 110/60 | Ht 64.0 in | Wt 176.0 lb

## 2022-06-23 DIAGNOSIS — Z1151 Encounter for screening for human papillomavirus (HPV): Secondary | ICD-10-CM

## 2022-06-23 DIAGNOSIS — N930 Postcoital and contact bleeding: Secondary | ICD-10-CM

## 2022-06-23 DIAGNOSIS — Z124 Encounter for screening for malignant neoplasm of cervix: Secondary | ICD-10-CM

## 2022-06-23 DIAGNOSIS — N72 Inflammatory disease of cervix uteri: Secondary | ICD-10-CM | POA: Diagnosis not present

## 2022-06-23 DIAGNOSIS — B9689 Other specified bacterial agents as the cause of diseases classified elsewhere: Secondary | ICD-10-CM

## 2022-06-23 DIAGNOSIS — Z113 Encounter for screening for infections with a predominantly sexual mode of transmission: Secondary | ICD-10-CM | POA: Insufficient documentation

## 2022-06-23 DIAGNOSIS — N760 Acute vaginitis: Secondary | ICD-10-CM | POA: Diagnosis not present

## 2022-06-23 LAB — POCT WET PREP WITH KOH
Clue Cells Wet Prep HPF POC: POSITIVE
KOH Prep POC: POSITIVE — AB
Trichomonas, UA: NEGATIVE
Yeast Wet Prep HPF POC: NEGATIVE

## 2022-06-23 MED ORDER — METRONIDAZOLE 500 MG PO TABS
500.0000 mg | ORAL_TABLET | Freq: Two times a day (BID) | ORAL | 0 refills | Status: DC
  Filled 2022-06-23: qty 14, 7d supply, fill #0

## 2022-06-23 MED ORDER — DOXYCYCLINE HYCLATE 100 MG PO CAPS
100.0000 mg | ORAL_CAPSULE | Freq: Two times a day (BID) | ORAL | 0 refills | Status: DC
  Filled 2022-06-23: qty 14, 7d supply, fill #0

## 2022-06-23 MED ORDER — FLUCONAZOLE 150 MG PO TABS
150.0000 mg | ORAL_TABLET | Freq: Once | ORAL | 0 refills | Status: AC
  Filled 2022-06-23: qty 1, 1d supply, fill #0

## 2022-06-23 NOTE — Progress Notes (Signed)
Christy Lick, NP   Chief Complaint  Patient presents with   Vaginal Discharge    Sour odor, itching x few days   BTB    No abnormal pain     HPI:      [name] is a 37 y.o. G1P1001 whose LMP was No LMP recorded. (Menstrual status: Other)., presents today for increased vag d/c with fishy odor, itching for a few days. Sx started after new recent sexual activity with new female partner (partner has never had a female partner). No urin sx, no recent BV sx, no recent abx use. Pt also had red bleeding during the first encounter and then red bleeding after sex the second time, lasting a couple days. Did have some vag pain 2nd time as well.  Pt does cont dosing nuvaring. Her menses are usually infrequent with cont dosing nuvaring for migraine headache prevention, lasting 3 days.  Dysmenorrhea none. She does not have intermenstrual bleeding usually but has been having some BTB recently and bleeding after sex. Normal TSH 1/24 but had been elevated for past  yr   Patient Active Problem List   Diagnosis Date Noted   Vitamin D deficiency 03/06/2022   Hashimoto's thyroiditis 10/10/2021   Body mass index 30.0-30.9, adult 10/10/2021   Depression, major, single episode, moderate ([place]) 09/03/2021   Generalized anxiety disorder 09/03/2021   High grade squamous intraepithelial lesion (HGSIL), grade 3 CIN, on biopsy of cervix 07/09/2021   History of cervical dysplasia 02/02/2019   Migraines     Past Surgical History:  Procedure Laterality Date   CHOLECYSTECTOMY  2015   COLPOSCOPY  06/05/2004,02/20/2009,10/01/2010   LEEP  12/20/2010   ECC &Endo Cx Negative, Ecto CIN 2-3    Family History  Problem Relation Age of Onset   COPD Mother    Diabetes Mother    Diabetes Father    Hypertension Father    Bipolar disorder Father    Breast cancer Paternal Grandmother 61   Diabetes Sister    Obesity Sister        morbid   Mental retardation Sister    Healthy Daughter     Suicidality Paternal Grandfather     Social History   Socioeconomic History   Marital status: Single    Spouse name: Not on file   Number of children: 1   Years of education: College   Highest education level: Bachelor's degree (e.g., BA, AB, BS)  Occupational History    Comment: RN in ED, night shift  Tobacco Use   Smoking status: Former    Packs/day: 1.00    Years: 13.00    Total pack years: 13.00    Types: Cigarettes    Quit date: 2018    Years since quitting: 6.0   Smokeless tobacco: Never  Vaping Use   Vaping Use: Never used  Substance and Sexual Activity   Alcohol use: Yes    Comment: once every 2 weeks, occasionally >4   Drug use: No   Sexual activity: Yes    Birth control/protection: Inserts    Comment: nuvaring  Other Topics Concern   Not on file  Social History Narrative   Works as an Health visitor at [employer]   One Daughter - Christy Whitehead - 61 years old now   Family support: dad is nearby and watches daughter   Enjoys: works a lot, spend time with friends, hiking, corn-hole, beach   Exercise: gym regularly - 1-2 since the gym closed  Diet: pretty healthy overall   Social Determinants of Health   Financial Resource Strain: Low Risk  (03/06/2022)   Overall Financial Resource Strain (CARDIA)    Difficulty of Paying Living Expenses: Not hard at all  Food Insecurity: No Food Insecurity (03/06/2022)   Hunger Vital Sign    Worried About Running Out of Food in the Last Year: Never true    Ran Out of Food in the Last Year: Never true  Transportation Needs: No Transportation Needs (03/06/2022)   PRAPARE - Administrator, Civil Service (Medical): No    Lack of Transportation (Non-Medical): No  Physical Activity: Sufficiently Active (03/06/2022)   Exercise Vital Sign    Days of Exercise per Week: 5 days    Minutes of Exercise per Session: 30 min  Stress: Stress Concern Present (03/06/2022)   [employer] of Occupational Health - Occupational Stress  Questionnaire    Feeling of Stress : To some extent  Social Connections: Moderately Isolated (03/06/2022)   Social Connection and Isolation Panel [NHANES]    Frequency of Communication with Friends and Family: More than three times a week    Frequency of Social Gatherings with Friends and Family: More than three times a week    Attends Religious Services: More than 4 times per year    Active Member of [employer] or Organizations: No    Attends Banker Meetings: Never    Marital Status: Separated  Intimate Partner Violence: Not At Risk (03/06/2022)   Humiliation, Afraid, Rape, and Kick questionnaire    Fear of Current or Ex-Partner: No    Emotionally Abused: No    Physically Abused: No    Sexually Abused: No    Outpatient Medications Prior to Visit  Medication Sig Dispense Refill   acetaminophen (TYLENOL) 500 MG tablet Take 500 mg by mouth every 6 (six) hours as needed.     Aspirin-Acetaminophen-Caffeine (EXCEDRIN PO) Take by mouth as needed.     Atogepant (QULIPTA) 60 MG TABS Take 1 tablet (60 mg) by mouth daily. 90 tablet 3   Cholecalciferol 25 MCG (1000 UT) capsule Take 2,000 Units by mouth daily.     cyanocobalamin (VITAMIN B12) 1000 MCG tablet Take 1,000 mcg by mouth daily.     etonogestrel-ethinyl estradiol (NUVARING) 0.12-0.015 MG/24HR vaginal ring INSERT VAGINALLY AND LEAVE IN PLACE FOR 3 CONSECUTIVE WEEKS, THEN REPLACE FOR CONTINUOUS DOSING DUE TO HEADACHES 3 each 4   levothyroxine (SYNTHROID) 75 MCG tablet Take 1 tablet (75 mcg total) by mouth daily. 30 tablet 5   ondansetron (ZOFRAN-ODT) 4 MG disintegrating tablet Take 1 tablet (4 mg total) by mouth every 8 (eight) hours as needed for nausea or vomiting. 10 tablet 3   Semaglutide-Weight Management (WEGOVY) 0.25 MG/0.5ML SOAJ Inject 0.25 mg into the skin once a week. After 4 weeks may increase to 0.5mg . 2 mL 1   SF 5000 PLUS 1.1 % CREA dental cream Take 1 Application by mouth at bedtime. 51 g 11   SUMAtriptan (IMITREX)  100 MG tablet Take 1 tablet (100 mg total) by mouth once as needed for up to 1 dose for migraine. May repeat in 2 hours if headache persists or recurs. 10 tablet 11   topiramate (TOPAMAX) 100 MG tablet Take 1 tablet (100 mg total) by mouth 2 (two) times daily. Please keep upcoming appt in March for continued refills. 180 tablet 3   No facility-administered medications prior to visit.      ROS:  Review of Systems  Constitutional:  Negative for fever.  Gastrointestinal:  Negative for blood in stool, constipation, diarrhea, nausea and vomiting.  Genitourinary:  Positive for vaginal bleeding and vaginal discharge. Negative for dyspareunia, dysuria, flank pain, frequency, hematuria, urgency and vaginal pain.  Musculoskeletal:  Negative for back pain.  Skin:  Negative for rash.   BREAST: No symptoms   OBJECTIVE:   Vitals:  BP 110/60   Ht 5\' 4"  (1.626 m)   Wt 176 lb (79.8 kg)   BMI 30.21 kg/m   Physical Exam Vitals reviewed.  Constitutional:      Appearance: She is well-developed.  Pulmonary:     Effort: Pulmonary effort is normal.  Genitourinary:    General: Normal vulva.     Pubic Area: No rash.      Labia:        Right: No rash, tenderness or lesion.        Left: No rash, tenderness or lesion.      Vagina: Vaginal discharge present. No erythema or tenderness.     Cervix: Friability and lesion present.     Uterus: Normal. Not enlarged and not tender.      Adnexa: Right adnexa normal and left adnexa normal.       Right: No mass or tenderness.         Left: No mass or tenderness.          Comments: THICK YELLOW/GREEN VAG D/C; FRIABLE CX;  Musculoskeletal:        General: Normal range of motion.     Cervical back: Normal range of motion.  Skin:    General: Skin is warm and dry.  Neurological:     General: No focal deficit present.     Mental Status: She is alert and oriented to person, place, and time.  Psychiatric:        Mood and Affect: Mood normal.         Behavior: Behavior normal.        Thought Content: Thought content normal.        Judgment: Judgment normal.     Results: Results for orders placed or performed in visit on 06/23/22 (from the past 24 hour(s))  POCT Wet Prep with KOH     Status: Abnormal   Collection Time: 06/23/22 11:58 AM  Result Value Ref Range   Trichomonas, UA Negative    Clue Cells Wet Prep HPF POC pos    Epithelial Wet Prep HPF POC     Yeast Wet Prep HPF POC neg    Bacteria Wet Prep HPF POC     RBC Wet Prep HPF POC pos    WBC Wet Prep HPF POC     KOH Prep POC Positive (A) Negative     Assessment/Plan: BV (bacterial vaginosis) - Plan: metroNIDAZOLE (FLAGYL) 500 MG tablet, POCT Wet Prep with KOH; pos sx and wet prep. Rx flagyl; Rx diflucan prn yeast vag sx after abx tx. Check culture to rule out trich based on exam findings  Cervicitis - Plan: doxycycline (VIBRAMYCIN) 100 MG capsule; friable cx, Rx doxy, rule out STDs, will f/u at 2/24 annual.   PCB (post coital bleeding) - Plan: doxycycline (VIBRAMYCIN) 100 MG capsule, Cytology - PAP; check/pap/STDs, treat for cervicitis.   Screening for STD (sexually transmitted disease) - Plan: Cytology - PAP  Cervical cancer screening - Plan: Cytology - PAP; hx of LEEP in past with cx findings. If neg, most likely from LEEP vs cx polyps  Screening for HPV (  human papillomavirus) - Plan: Cytology - PAP    Meds ordered this encounter  Medications   metroNIDAZOLE (FLAGYL) 500 MG tablet    Sig: Take 1 tablet (500 mg total) by mouth 2 (two) times daily for 7 days; NO alcohol use for 10 days after prescription start.    Dispense:  14 tablet    Refill:  0    Order Specific Question:   Supervising Provider    Answer:   Rubie Maid [[id number]]   doxycycline (VIBRAMYCIN) 100 MG capsule    Sig: Take 1 capsule (100 mg total) by mouth 2 (two) times daily.    Dispense:  14 capsule    Refill:  0    Order Specific Question:   Supervising Provider    Answer:   Rubie Maid  [[id number]]   fluconazole (DIFLUCAN) 150 MG tablet    Sig: Take 1 tablet (150 mg total) by mouth once for 1 dose.    Dispense:  1 tablet    Refill:  0    Order Specific Question:   Supervising Provider    Answer:   Rubie Maid [[id number]]      Return in about 4 weeks (around 06/27/5425) for annual.  Takahiro Godinho B. Saundra Gin, PA-C 06/23/2022 12:04 PM

## 2022-06-23 NOTE — Patient Instructions (Signed)
I value your feedback and you entrusting us with your care. If you get a [facility] patient survey, I would appreciate you taking the time to let us know about your experience today. Thank you! ? ? ?

## 2022-06-23 NOTE — Telephone Encounter (Signed)
Paperwork faxed back to [employer]

## 2022-06-24 ENCOUNTER — Other Ambulatory Visit: Payer: Self-pay

## 2022-06-24 ENCOUNTER — Encounter: Payer: Self-pay | Admitting: Nurse Practitioner

## 2022-06-24 MED ORDER — SAXENDA 18 MG/3ML ~~LOC~~ SOPN
0.6000 mg | PEN_INJECTOR | Freq: Every day | SUBCUTANEOUS | 1 refills | Status: DC
  Filled 2022-06-24 – 2022-06-27 (×3): qty 15, 30d supply, fill #0
  Filled 2022-07-23: qty 15, 30d supply, fill #1

## 2022-06-25 ENCOUNTER — Telehealth: Payer: Self-pay

## 2022-06-25 ENCOUNTER — Other Ambulatory Visit: Payer: Self-pay

## 2022-06-25 NOTE — Telephone Encounter (Signed)
PA started for Saxenda through [employer]. Awaiting on determination

## 2022-06-26 LAB — CYTOLOGY - PAP
Chlamydia: NEGATIVE
Comment: NEGATIVE
Comment: NEGATIVE
Comment: NORMAL
Diagnosis: NEGATIVE
Neisseria Gonorrhea: NEGATIVE
Trichomonas: NEGATIVE

## 2022-06-27 ENCOUNTER — Other Ambulatory Visit: Payer: Self-pay

## 2022-06-27 MED ORDER — INSULIN PEN NEEDLE 31G X 5 MM MISC
0 refills | Status: DC
  Filled 2022-06-27: qty 100, 90d supply, fill #0

## 2022-07-07 ENCOUNTER — Other Ambulatory Visit: Payer: Self-pay

## 2022-07-23 ENCOUNTER — Other Ambulatory Visit: Payer: Self-pay | Admitting: Nurse Practitioner

## 2022-07-24 ENCOUNTER — Other Ambulatory Visit: Payer: Self-pay

## 2022-07-24 ENCOUNTER — Ambulatory Visit: Payer: Self-pay | Admitting: Nurse Practitioner

## 2022-07-24 MED ORDER — UNIFINE PENTIPS 31G X 5 MM MISC
0 refills | Status: DC
  Filled 2022-07-24: qty 100, fill #0
  Filled 2022-09-29: qty 100, 100d supply, fill #0

## 2022-07-24 NOTE — Telephone Encounter (Signed)
Requested Prescriptions  Pending Prescriptions Disp Refills   Insulin Pen Needle (UNIFINE PENTIPS) 31G X 5 MM MISC 100 each 0    Sig: Use with Saxenda     Endocrinology: Diabetes - Testing Supplies Passed - 07/23/2022  7:19 PM      Passed - Valid encounter within last 12 months    Recent Outpatient Visits           1 month ago Hashimoto's thyroiditis   [facility] Kathrine Haddock, NP   2 months ago Hashimoto's thyroiditis   [facility] [place], Henrine Screws T, NP   3 months ago Hashimoto's thyroiditis   [facility] [place], [place] T, NP   4 months ago Depression, major, single episode, moderate ([place])   [facility] [place], Barbaraann Faster, NP       Future Appointments             In 5 days Cannady, Barbaraann Faster, NP [facility]   In 3 months Lomax, Amy, NP [facility]

## 2022-07-26 NOTE — Patient Instructions (Signed)

## 2022-07-29 ENCOUNTER — Other Ambulatory Visit: Payer: Self-pay

## 2022-07-29 ENCOUNTER — Ambulatory Visit (INDEPENDENT_AMBULATORY_CARE_PROVIDER_SITE_OTHER): Payer: 59 | Admitting: Nurse Practitioner

## 2022-07-29 ENCOUNTER — Encounter: Payer: Self-pay | Admitting: Nurse Practitioner

## 2022-07-29 VITALS — BP 97/61 | HR 61 | Temp 98.0°F | Ht 64.02 in | Wt 168.7 lb

## 2022-07-29 DIAGNOSIS — E063 Autoimmune thyroiditis: Secondary | ICD-10-CM | POA: Diagnosis not present

## 2022-07-29 DIAGNOSIS — Z6830 Body mass index (BMI) 30.0-30.9, adult: Secondary | ICD-10-CM | POA: Diagnosis not present

## 2022-07-29 MED ORDER — SAXENDA 18 MG/3ML ~~LOC~~ SOPN
3.0000 mg | PEN_INJECTOR | Freq: Every day | SUBCUTANEOUS | 1 refills | Status: DC
  Filled 2022-07-29 – 2022-08-15 (×4): qty 15, 30d supply, fill #0

## 2022-07-29 MED ORDER — SAXENDA 18 MG/3ML ~~LOC~~ SOPN
0.6000 mg | PEN_INJECTOR | Freq: Every day | SUBCUTANEOUS | 1 refills | Status: DC
  Filled 2022-07-29: qty 15, 30d supply, fill #0

## 2022-07-29 NOTE — Progress Notes (Signed)
BP 97/61   Pulse 61   Temp 98 F (36.7 C) (Oral)   Ht 5' 4.02" (1.626 m)   Wt 168 lb 11.2 oz (76.5 kg)   SpO2 99%   BMI 28.94 kg/m    Subjective:    Patient ID: Christy Whitehead, female    DOB: November 02, 1985, 37 y.o.   MRN: [id number]  HPI: Christy Whitehead is a 37 y.o. female  Chief Complaint  Patient presents with   Weight Check   Hashimoto's Thyroiditis   HYPOTHYROIDISM Currently taking Levothyroxine 75 MCG, tolerating well without ADR.  Recent labs stable.    Taking Saxenda at this time, but coverage is about to end in April.  Is working with Topamax for migraines. Thyroid control status:stable Satisfied with current treatment? no Medication side effects: no Medication compliance: good compliance Etiology of hypothyroidism: Hashimoto's Recent dose adjustment: 05/20/22 Fatigue: improving Cold intolerance: yes, improving Heat intolerance: yes, improving Weight gain: no Weight loss: no Constipation:  occasional -- takes magnesium supplement Diarrhea/loose stools: no Palpitations: no Lower extremity edema: no Anxiety/depressed mood: improving    07/29/2022    9:58 AM 06/17/2022    2:32 PM 05/20/2022    2:17 PM 04/22/2022    8:30 AM 03/06/2022    8:48 AM  Depression screen PHQ 2/9  Decreased Interest 0 '1 1 2 1  '$ Down, Depressed, Hopeless 0 0 0 1 1  PHQ - 2 Score 0 '1 1 3 2  '$ Altered sleeping '1 2 2 2 3  '$ Tired, decreased energy '1 1 1 3 2  '$ Change in appetite 1 0 '1 2 2  '$ Feeling bad or failure about yourself  0 0 0 1 1  Trouble concentrating 0 0 0 0 1  Moving slowly or fidgety/restless 0 0 0 0 2  Suicidal thoughts 0 0 0 0 0  PHQ-9 Score '3 4 5 11 13  '$ Difficult doing work/chores Not difficult at all Somewhat difficult Not difficult at all  Somewhat difficult       07/29/2022    9:58 AM 06/17/2022    2:33 PM 05/20/2022    2:17 PM 04/22/2022    8:30 AM  GAD 7 : Generalized Anxiety Score  Nervous, Anxious, on Edge 0 1 0 1  Control/stop worrying 0 0 0  0  Worry too much - different things 0 0 1 1  Trouble relaxing 0 0 0 1  Restless 0 1 0 1  Easily annoyed or irritable 1 0 2 1  Afraid - awful might happen 0 0 0 0  Total GAD 7 Score '1 2 3 5  '$ Anxiety Difficulty Not difficult at all Not difficult at all Not difficult at all Somewhat difficult   Relevant past medical, surgical, family and social history reviewed and updated as indicated. Interim medical history since our last visit reviewed. Allergies and medications reviewed and updated.  Review of Systems  Constitutional:  Positive for fatigue. Negative for activity change, appetite change, diaphoresis and fever.  Respiratory:  Negative for cough, chest tightness and shortness of breath.   Cardiovascular:  Negative for chest pain, palpitations and leg swelling.  Gastrointestinal:  Positive for constipation. Negative for abdominal pain, diarrhea, nausea and vomiting.  Endocrine: Positive for cold intolerance. Negative for heat intolerance, polydipsia, polyphagia and polyuria.  Neurological:  Negative for dizziness, syncope, weakness, light-headedness, numbness and headaches.  Psychiatric/Behavioral: Negative.     Per HPI unless specifically indicated above     Objective:    BP 97/61  Pulse 61   Temp 98 F (36.7 C) (Oral)   Ht 5' 4.02" (1.626 m)   Wt 168 lb 11.2 oz (76.5 kg)   SpO2 99%   BMI 28.94 kg/m   Wt Readings from Last 3 Encounters:  07/29/22 168 lb 11.2 oz (76.5 kg)  06/23/22 176 lb (79.8 kg)  06/17/22 178 lb 8 oz (81 kg)    Physical Exam Vitals and nursing note reviewed.  Constitutional:      General: She is awake. She is not in acute distress.    Appearance: She is well-developed, well-groomed and overweight. She is not ill-appearing or toxic-appearing.  HENT:     Head: Normocephalic.     Right Ear: Hearing normal.     Left Ear: Hearing normal.  Eyes:     General: Lids are normal.        Right eye: No discharge.        Left eye: No discharge.      Conjunctiva/sclera: Conjunctivae normal.     Pupils: Pupils are equal, round, and reactive to light.  Neck:     Thyroid: No thyromegaly.     Vascular: No carotid bruit.  Cardiovascular:     Rate and Rhythm: Normal rate and regular rhythm.     Heart sounds: Normal heart sounds. No murmur heard.    No gallop.  Pulmonary:     Effort: Pulmonary effort is normal. No accessory muscle usage or respiratory distress.     Breath sounds: Normal breath sounds.  Abdominal:     General: Bowel sounds are normal.     Palpations: Abdomen is soft.  Musculoskeletal:     Cervical back: Normal range of motion and neck supple.     Right lower leg: No edema.     Left lower leg: No edema.  Lymphadenopathy:     Cervical: No cervical adenopathy.  Skin:    General: Skin is warm and dry.  Neurological:     Mental Status: She is alert and oriented to person, place, and time.  Psychiatric:        Attention and Perception: Attention normal.        Mood and Affect: Mood normal.        Speech: Speech normal.        Behavior: Behavior normal. Behavior is cooperative.        Thought Content: Thought content normal.    Results for orders placed or performed in visit on 06/23/22  POCT Wet Prep with KOH  Result Value Ref Range   Trichomonas, UA Negative    Clue Cells Wet Prep HPF POC pos    Epithelial Wet Prep HPF POC     Yeast Wet Prep HPF POC neg    Bacteria Wet Prep HPF POC     RBC Wet Prep HPF POC     WBC Wet Prep HPF POC     KOH Prep POC Positive (A) Negative  Cytology - PAP  Result Value Ref Range   Neisseria Gonorrhea Negative    Chlamydia Negative    Trichomonas Negative    Adequacy      Satisfactory for evaluation; transformation zone component PRESENT.   Diagnosis      - Negative for intraepithelial lesion or malignancy (NILM)   Comment Normal Reference Range Trichomonas - Negative    Comment Normal Reference Ranger Chlamydia - Negative    Comment      Normal Reference Range Neisseria  Gonorrhea - Negative  Assessment & Plan:   Problem List Items Addressed This Visit       Endocrine   Hashimoto's thyroiditis - Primary    Chronic, stable on recent labs.  Will recheck today to ensure ongoing stability and continue current dosing, unless labs show need to change. Return in 6 months.      Relevant Orders   T4, free   TSH     Other   Body mass index 30.0-30.9, adult    BMI 28.94, is losing weight with Saxenda.  Has mild skin reaction to injection, which OTC antihistamine helps improve.  Continue current dosing while available and recommend looking up coupon online to assist with cost.  Recommended eating smaller high protein, low fat meals more frequently and exercising 30 mins a day 5 times a week with a goal of 10-15lb weight loss in the next 3 months. Patient voiced their understanding and motivation to adhere to these recommendations.         Follow up plan: Return in about 6 months (around 01/29/2023) for Thyroid and weight.

## 2022-07-29 NOTE — Assessment & Plan Note (Signed)
BMI 28.94, is losing weight with Saxenda.  Has mild skin reaction to injection, which OTC antihistamine helps improve.  Continue current dosing while available and recommend looking up coupon online to assist with cost.  Recommended eating smaller high protein, low fat meals more frequently and exercising 30 mins a day 5 times a week with a goal of 10-15lb weight loss in the next 3 months. Patient voiced their understanding and motivation to adhere to these recommendations.

## 2022-07-29 NOTE — Assessment & Plan Note (Signed)
Chronic, stable on recent labs.  Will recheck today to ensure ongoing stability and continue current dosing, unless labs show need to change. Return in 6 months.

## 2022-07-30 ENCOUNTER — Other Ambulatory Visit: Payer: Self-pay

## 2022-07-30 ENCOUNTER — Other Ambulatory Visit: Payer: Self-pay | Admitting: Nurse Practitioner

## 2022-07-30 DIAGNOSIS — E063 Autoimmune thyroiditis: Secondary | ICD-10-CM

## 2022-07-30 LAB — TSH: TSH: 4.93 u[IU]/mL — ABNORMAL HIGH (ref 0.450–4.500)

## 2022-07-30 LAB — T4, FREE: Free T4: 1.37 ng/dL (ref 0.82–1.77)

## 2022-07-30 MED ORDER — LEVOTHYROXINE SODIUM 100 MCG PO TABS
100.0000 ug | ORAL_TABLET | Freq: Every day | ORAL | 3 refills | Status: DC
  Filled 2022-07-30: qty 30, 30d supply, fill #0
  Filled 2022-08-26: qty 30, 30d supply, fill #1
  Filled 2022-09-29: qty 30, 30d supply, fill #2
  Filled 2022-10-24: qty 30, 30d supply, fill #3

## 2022-07-30 NOTE — Progress Notes (Signed)
Called the pt was unable to lmom for the pt to call the office to schedule an lab appointment.

## 2022-07-30 NOTE — Progress Notes (Signed)
Contacted via [facility]  -- need lab only visit in 6 weeks please   Good morning Vonna, your labs have returned and I am glad we recheck as TSH has trended up a little again, Free T4 normal.  With your fatigue ongoing I recommend we increase Levothyroxine to 100 MCG and stop 75 MCG dosing.  Then my staff will call and we will repeat labs only in 6 weeks, no visit needed.  I will send in new dose today and start in the morning.  Any questions? Keep being amazing!!  Thank you for allowing me to participate in your care.  I appreciate you. Kindest regards, Lannie Yusuf

## 2022-07-31 ENCOUNTER — Other Ambulatory Visit: Payer: Self-pay

## 2022-08-03 NOTE — Progress Notes (Unsigned)
PCP:  [name], NP   No chief complaint on file.    HPI:      [name] is a 37 y.o. G1P1001 who LMP was No LMP recorded. (Menstrual status: Other)., presents today for her annual examination.  Her menses are infrequent with cont dosing nuvaring for migraine headache prevention, lasting 3 days.  Dysmenorrhea none. She does not have intermenstrual bleeding. Hx of migraine without aura for many yrs. On Qulipta now with sx HA improvement. Did well on aimovig but had injection site rxn and couldn't continue.   Sex activity: not sex active- NuvaRing vaginal inserts.  Last Pap: 06/23/22  Results were: no abnormalities /neg HPV DNA 2019 Hx of STDs: HPV--hx of CIN3 with LEEP 2012  Seen 1/24 for PCB and BV. Neg pap/STD testing.   There is a FH of breast cancer in her PGM, genetic testing not indicated. There is no FH of ovarian cancer. The patient does not do self-breast exams.  Tobacco use: The patient denies current or previous tobacco use. Alcohol use: social drinker No drug use.  Exercise: moderately active  She does get adequate calcium but not Vitamin D in her diet.  Labs with PCP  Past Medical History:  Diagnosis Date   Abnormal Pap smear of cervix 12/19/2008, 02/20/2009   ASCUS   Abnormal Pap smear of cervix 08/06/2010, 10/01/2010   HGSIL   Cervical dysplasia CIN1 06/05/2004   CIN III (cervical intraepithelial neoplasia grade III) with severe dysplasia 12/20/2010   LEEP Ecto Cervix   Dysmenorrhea    Migraines     Past Surgical History:  Procedure Laterality Date   CHOLECYSTECTOMY  2015   COLPOSCOPY  06/05/2004,02/20/2009,10/01/2010   LEEP  12/20/2010   ECC &Endo Cx Negative, Ecto CIN 2-3    Family History  Problem Relation Age of Onset   COPD Mother    Diabetes Mother    Diabetes Father    Hypertension Father    Bipolar disorder Father    Breast cancer Paternal Grandmother 23   Diabetes Sister    Obesity Sister        morbid   Mental  retardation Sister    Healthy Daughter    Suicidality Paternal Grandfather     Social History   Socioeconomic History   Marital status: Single    Spouse name: Not on file   Number of children: 1   Years of education: College   Highest education level: Bachelor's degree (e.g., BA, AB, BS)  Occupational History    Comment: RN in ED, night shift  Tobacco Use   Smoking status: Former    Packs/day: 1.00    Years: 13.00    Total pack years: 13.00    Types: Cigarettes    Quit date: 2018    Years since quitting: 6.1   Smokeless tobacco: Never  Vaping Use   Vaping Use: Never used  Substance and Sexual Activity   Alcohol use: Yes    Comment: once every 2 weeks, occasionally >4   Drug use: No   Sexual activity: Yes    Birth control/protection: Inserts    Comment: nuvaring  Other Topics Concern   Not on file  Social History Narrative   Works as an Health visitor at [employer]   One Daughter - [name] - 55 years old now   Family support: dad is nearby and watches daughter   Enjoys: works a lot, spend time with friends, hiking, corn-hole, beach   Exercise:  gym regularly - 1-2 since the gym closed   Diet: pretty healthy overall   Social Determinants of Health   Financial Resource Strain: Low Risk  (03/06/2022)   Overall Financial Resource Strain (CARDIA)    Difficulty of Paying Living Expenses: Not hard at all  Food Insecurity: No Food Insecurity (03/06/2022)   Hunger Vital Sign    Worried About Running Out of Food in the Last Year: Never true    Ran Out of Food in the Last Year: Never true  Transportation Needs: No Transportation Needs (03/06/2022)   PRAPARE - Hydrologist (Medical): No    Lack of Transportation (Non-Medical): No  Physical Activity: Sufficiently Active (03/06/2022)   Exercise Vital Sign    Days of Exercise per Week: 5 days    Minutes of Exercise per Session: 30 min  Stress: Stress Concern Present (03/06/2022)   [place]    Feeling of Stress : To some extent  Social Connections: Moderately Isolated (03/06/2022)   Social Connection and Isolation Panel [NHANES]    Frequency of Communication with Friends and Family: More than three times a week    Frequency of Social Gatherings with Friends and Family: More than three times a week    Attends Religious Services: More than 4 times per year    Active Member of [employer] or Organizations: No    Attends Archivist Meetings: Never    Marital Status: Separated  Intimate Partner Violence: Not At Risk (03/06/2022)   Humiliation, Afraid, Rape, and Kick questionnaire    Fear of Current or Ex-Partner: No    Emotionally Abused: No    Physically Abused: No    Sexually Abused: No    Outpatient Medications Prior to Visit  Medication Sig Dispense Refill   acetaminophen (TYLENOL) 500 MG tablet Take 500 mg by mouth every 6 (six) hours as needed.     Aspirin-Acetaminophen-Caffeine (EXCEDRIN PO) Take by mouth as needed.     Atogepant (QULIPTA) 60 MG TABS Take 1 tablet (60 mg) by mouth daily. 90 tablet 3   Cholecalciferol 25 MCG (1000 UT) capsule Take 2,000 Units by mouth daily.     cyanocobalamin (VITAMIN B12) 1000 MCG tablet Take 1,000 mcg by mouth daily.     etonogestrel-ethinyl estradiol (NUVARING) 0.12-0.015 MG/24HR vaginal ring INSERT VAGINALLY AND LEAVE IN PLACE FOR 3 CONSECUTIVE WEEKS, THEN REPLACE FOR CONTINUOUS DOSING DUE TO HEADACHES 3 each 4   Insulin Pen Needle (UNIFINE PENTIPS) 31G X 5 MM MISC Use with Saxenda 100 each 0   levothyroxine (SYNTHROID) 100 MCG tablet Take 1 tablet (100 mcg total) by mouth daily. 30 tablet 3   Liraglutide -Weight Management (SAXENDA) 18 MG/3ML SOPN Inject 3 mg into the skin daily. 15 mL 1   ondansetron (ZOFRAN-ODT) 4 MG disintegrating tablet Take 1 tablet (4 mg total) by mouth every 8 (eight) hours as needed for nausea or vomiting. 10 tablet 3   SF 5000 PLUS 1.1 % CREA  dental cream Take 1 Application by mouth at bedtime. 51 g 11   SUMAtriptan (IMITREX) 100 MG tablet Take 1 tablet (100 mg total) by mouth once as needed for up to 1 dose for migraine. May repeat in 2 hours if headache persists or recurs. 10 tablet 11   topiramate (TOPAMAX) 100 MG tablet Take 1 tablet (100 mg total) by mouth 2 (two) times daily. Please keep upcoming appt in March for continued refills. [place]  tablet 3   No facility-administered medications prior to visit.      ROS:  Review of Systems  Constitutional:  Negative for fatigue, fever and unexpected weight change.  Respiratory:  Negative for cough, shortness of breath and wheezing.   Cardiovascular:  Negative for chest pain, palpitations and leg swelling.  Gastrointestinal:  Negative for blood in stool, constipation, diarrhea, nausea and vomiting.  Endocrine: Negative for cold intolerance, heat intolerance and polyuria.  Genitourinary:  Negative for dyspareunia, dysuria, flank pain, frequency, genital sores, hematuria, menstrual problem, pelvic pain, urgency, vaginal bleeding, vaginal discharge and vaginal pain.  Musculoskeletal:  Negative for back pain, joint swelling and myalgias.  Skin:  Negative for rash.  Neurological:  Negative for dizziness, syncope, light-headedness, numbness and headaches.  Hematological:  Negative for adenopathy.  Psychiatric/Behavioral:  Negative for agitation, confusion, sleep disturbance and suicidal ideas. The patient is not nervous/anxious.    BREAST: No symptoms   Objective: There were no vitals taken for this visit.   Physical Exam Constitutional:      Appearance: She is well-developed.  Genitourinary:     Vulva normal.     Right Labia: No rash, tenderness or lesions.    Left Labia: No tenderness, lesions or rash.    No vaginal discharge, erythema or tenderness.      Right Adnexa: not tender and no mass present.    Left Adnexa: not tender and no mass present.    No cervical motion  tenderness, friability or polyp.     Uterus is not enlarged or tender.  Breasts:    Right: No mass, nipple discharge, skin change or tenderness.     Left: No mass, nipple discharge, skin change or tenderness.  Neck:     Thyroid: No thyromegaly.  Cardiovascular:     Rate and Rhythm: Normal rate and regular rhythm.     Heart sounds: Normal heart sounds. No murmur heard. Pulmonary:     Effort: Pulmonary effort is normal.     Breath sounds: Normal breath sounds.  Abdominal:     Palpations: Abdomen is soft.     Tenderness: There is no abdominal tenderness. There is no guarding or rebound.  Musculoskeletal:        General: Normal range of motion.     Cervical back: Normal range of motion.  Lymphadenopathy:     Cervical: No cervical adenopathy.  Neurological:     General: No focal deficit present.     Mental Status: She is alert and oriented to person, place, and time.     Cranial Nerves: No cranial nerve deficit.  Skin:    General: Skin is warm and dry.  Psychiatric:        Mood and Affect: Mood normal.        Behavior: Behavior normal.        Thought Content: Thought content normal.        Judgment: Judgment normal.  Vitals reviewed.     Assessment/Plan: Encounter for annual routine gynecological examination  Cervical cancer screening - Plan: Cytology - PAP  Screening for HPV (human papillomavirus) - Plan: Cytology - PAP  High grade squamous intraepithelial lesion (HGSIL), grade 3 CIN, on biopsy of cervix - Plan: Cytology - PAP; repeat pap today  Encounter for surveillance of vaginal ring hormonal contraceptive device - Plan: etonogestrel-ethinyl estradiol (NUVARING) 0.12-0.015 MG/24HR vaginal ring; Rx RF.    No orders of the defined types were placed in this encounter.  GYN counsel adequate intake of calcium and vitamin D, diet and exercise     F/U  No follow-ups on file.  Christy Whitehead B. Mikeria Valin, PA-C 08/03/2022 5:47 PM

## 2022-08-04 ENCOUNTER — Ambulatory Visit (INDEPENDENT_AMBULATORY_CARE_PROVIDER_SITE_OTHER): Payer: 59 | Admitting: Obstetrics and Gynecology

## 2022-08-04 ENCOUNTER — Encounter: Payer: Self-pay | Admitting: Obstetrics and Gynecology

## 2022-08-04 ENCOUNTER — Other Ambulatory Visit: Payer: Self-pay

## 2022-08-04 VITALS — BP 102/70 | Ht 64.0 in | Wt 170.0 lb

## 2022-08-04 DIAGNOSIS — N921 Excessive and frequent menstruation with irregular cycle: Secondary | ICD-10-CM

## 2022-08-04 DIAGNOSIS — Z3044 Encounter for surveillance of vaginal ring hormonal contraceptive device: Secondary | ICD-10-CM

## 2022-08-04 DIAGNOSIS — Z01419 Encounter for gynecological examination (general) (routine) without abnormal findings: Secondary | ICD-10-CM

## 2022-08-04 DIAGNOSIS — N930 Postcoital and contact bleeding: Secondary | ICD-10-CM

## 2022-08-04 MED ORDER — ETONOGESTREL-ETHINYL ESTRADIOL 0.12-0.015 MG/24HR VA RING
VAGINAL_RING | VAGINAL | 4 refills | Status: DC
  Filled 2022-08-04 – 2022-08-09 (×2): qty 3, 63d supply, fill #0
  Filled 2022-08-14: qty 3, 84d supply, fill #0
  Filled 2022-08-26: qty 3, 63d supply, fill #0
  Filled 2022-10-14 – 2022-10-24 (×2): qty 3, 84d supply, fill #1
  Filled 2022-10-28: qty 3, 63d supply, fill #1
  Filled 2023-01-07: qty 3, 63d supply, fill #2
  Filled 2023-03-13 – 2023-04-12 (×2): qty 3, 63d supply, fill #3
  Filled 2023-06-12: qty 3, 63d supply, fill #4

## 2022-08-04 NOTE — Patient Instructions (Signed)
I value your feedback and you entrusting us with your care. If you get a [facility] patient survey, I would appreciate you taking the time to let us know about your experience today. Thank you! ? ? ?

## 2022-08-10 ENCOUNTER — Other Ambulatory Visit: Payer: Self-pay

## 2022-08-11 ENCOUNTER — Other Ambulatory Visit: Payer: Self-pay

## 2022-08-14 ENCOUNTER — Other Ambulatory Visit: Payer: Self-pay

## 2022-08-15 ENCOUNTER — Other Ambulatory Visit: Payer: Self-pay

## 2022-08-26 ENCOUNTER — Other Ambulatory Visit: Payer: Self-pay

## 2022-08-27 ENCOUNTER — Other Ambulatory Visit: Payer: Self-pay

## 2022-09-01 ENCOUNTER — Other Ambulatory Visit (HOSPITAL_BASED_OUTPATIENT_CLINIC_OR_DEPARTMENT_OTHER): Payer: Self-pay

## 2022-09-02 ENCOUNTER — Encounter: Payer: Self-pay | Admitting: Obstetrics and Gynecology

## 2022-09-02 DIAGNOSIS — N939 Abnormal uterine and vaginal bleeding, unspecified: Secondary | ICD-10-CM

## 2022-09-02 DIAGNOSIS — N930 Postcoital and contact bleeding: Secondary | ICD-10-CM

## 2022-09-09 ENCOUNTER — Other Ambulatory Visit: Payer: Self-pay

## 2022-09-11 ENCOUNTER — Other Ambulatory Visit: Payer: Self-pay

## 2022-09-23 ENCOUNTER — Other Ambulatory Visit: Payer: Self-pay

## 2022-09-23 ENCOUNTER — Ambulatory Visit (INDEPENDENT_AMBULATORY_CARE_PROVIDER_SITE_OTHER): Payer: 59

## 2022-09-23 ENCOUNTER — Telehealth: Payer: Self-pay | Admitting: Obstetrics and Gynecology

## 2022-09-23 DIAGNOSIS — N930 Postcoital and contact bleeding: Secondary | ICD-10-CM | POA: Diagnosis not present

## 2022-09-23 DIAGNOSIS — N939 Abnormal uterine and vaginal bleeding, unspecified: Secondary | ICD-10-CM

## 2022-09-23 MED ORDER — ESTRADIOL 1 MG PO TABS
1.0000 mg | ORAL_TABLET | Freq: Every day | ORAL | 0 refills | Status: DC
  Filled 2022-09-23: qty 14, 14d supply, fill #0

## 2022-09-23 NOTE — Telephone Encounter (Addendum)
Pt aware of GYN u/s results. Having BTB with cont dosing nuvaring for migraine prevention. Also bleeding for a couple days after sex. EM=1.05 mm. Question thin EM as cause of sx. Try estradiol 1 mg QD for 2 wks to stabilize lining. Cont nuvaring. Pt to f/u with sx.  Does have some constipation with lots of bowel on GYN u/s.   Meds ordered this encounter  Medications   estradiol (ESTRACE) 1 MG tablet    Sig: Take 1 tablet (1 mg total) by mouth daily for 14 days.    Dispense:  14 tablet    Refill:  0    Order Specific Question:   Supervising Provider    Answer:   Hildred Laser [[id number]]

## 2022-09-26 ENCOUNTER — Other Ambulatory Visit: Payer: Self-pay

## 2022-09-26 ENCOUNTER — Encounter: Payer: Self-pay | Admitting: Nurse Practitioner

## 2022-09-26 MED ORDER — ZEPBOUND 5 MG/0.5ML ~~LOC~~ SOAJ
5.0000 mg | SUBCUTANEOUS | 4 refills | Status: DC
  Filled 2022-09-26: qty 2, 28d supply, fill #0
  Filled 2022-10-24: qty 2, 28d supply, fill #1
  Filled 2022-12-03: qty 2, 28d supply, fill #2
  Filled 2023-01-07: qty 2, 28d supply, fill #3

## 2022-09-28 ENCOUNTER — Other Ambulatory Visit: Payer: Self-pay

## 2022-09-29 ENCOUNTER — Other Ambulatory Visit: Payer: Self-pay

## 2022-10-07 ENCOUNTER — Telehealth: Payer: Self-pay

## 2022-10-07 NOTE — Telephone Encounter (Addendum)
PA started for Saxenda by phone call to insurance company. PA # [id number]. Awaiting on determination, can take 24 to 72 hrs per insurance company

## 2022-10-14 ENCOUNTER — Other Ambulatory Visit: Payer: Self-pay | Admitting: Neurology

## 2022-10-14 ENCOUNTER — Other Ambulatory Visit: Payer: Self-pay

## 2022-10-14 ENCOUNTER — Other Ambulatory Visit: Payer: Self-pay | Admitting: Nurse Practitioner

## 2022-10-15 ENCOUNTER — Telehealth: Payer: Self-pay | Admitting: *Deleted

## 2022-10-15 ENCOUNTER — Other Ambulatory Visit: Payer: Self-pay

## 2022-10-15 MED FILL — Ondansetron Orally Disintegrating Tab 4 MG: ORAL | 4 days supply | Qty: 10 | Fill #0 | Status: CN

## 2022-10-15 NOTE — Telephone Encounter (Signed)
Requested medication (s) are due for refill today: duplicate request. Already signed by A. Lomax, NP 10/15/22  Requested medication (s) are on the active medication list: yes   Last refill:  10/15/22 #10 3 refills  Future visit scheduled: yes in 3 months   Notes to clinic:  not delegated per protocol. Duplicate request. Already signed by A. Lomax, NP     Requested Prescriptions  Pending Prescriptions Disp Refills   ondansetron (ZOFRAN-ODT) 4 MG disintegrating tablet 10 tablet 3    Sig: Take 1 tablet (4 mg total) by mouth every 8 (eight) hours as needed for nausea or vomiting.     Not Delegated - Gastroenterology: Antiemetics - ondansetron Failed - 10/14/2022  3:09 PM      Failed - This refill cannot be delegated      Passed - AST in normal range and within 360 days    AST  Date Value Ref Range Status  03/06/2022 16 0 - 40 IU/L Final         Passed - ALT in normal range and within 360 days    ALT  Date Value Ref Range Status  03/06/2022 15 0 - 32 IU/L Final         Passed - Valid encounter within last 6 months    Recent Outpatient Visits           2 months ago Hashimoto's thyroiditis   [place] [facility] [place], Aldrich T, NP   4 months ago Hashimoto's thyroiditis   [place] [facility] Gabriel Cirri, NP   4 months ago Hashimoto's thyroiditis   [place] [facility] [place], [place]y T, NP   5 months ago Hashimoto's thyroiditis   [place] [facility] [place], Parmelee T, NP   7 months ago Depression, major, single episode, moderate (HCC)   [place] [facility] [place], Dorie Rank, NP       Future Appointments             In 2 weeks Lomax, Amy, NP [place]   In 3 months Cannady, Dorie Rank, NP [facility]

## 2022-10-15 NOTE — Telephone Encounter (Signed)
Looks like her PCP already has refill set up. She should be good to go.

## 2022-10-15 NOTE — Telephone Encounter (Signed)
Noted  

## 2022-10-15 NOTE — Telephone Encounter (Signed)
Received fax request asking for refill on Zofran ODT 4mg  to be sent.  Pt last seen 10/24/21 and next f/u 11/04/22.  Last rx refill sent in by AL,NP 10/24/21 #10, 3 refills.  Appears pt also sent refill request to PCP that is pending.

## 2022-10-24 ENCOUNTER — Other Ambulatory Visit: Payer: Self-pay

## 2022-10-27 ENCOUNTER — Other Ambulatory Visit: Payer: Self-pay

## 2022-10-28 ENCOUNTER — Other Ambulatory Visit: Payer: Self-pay

## 2022-11-04 ENCOUNTER — Other Ambulatory Visit: Payer: Self-pay

## 2022-11-04 ENCOUNTER — Telehealth (INDEPENDENT_AMBULATORY_CARE_PROVIDER_SITE_OTHER): Payer: 59 | Admitting: Family Medicine

## 2022-11-04 ENCOUNTER — Encounter: Payer: Self-pay | Admitting: Family Medicine

## 2022-11-04 DIAGNOSIS — G43009 Migraine without aura, not intractable, without status migrainosus: Secondary | ICD-10-CM

## 2022-11-04 MED ORDER — SUMATRIPTAN SUCCINATE 100 MG PO TABS
100.0000 mg | ORAL_TABLET | Freq: Once | ORAL | 11 refills | Status: DC | PRN
  Filled 2022-11-04: qty 9, 30d supply, fill #0
  Filled 2023-05-18 (×2): qty 9, 30d supply, fill #1

## 2022-11-04 MED ORDER — TOPIRAMATE 100 MG PO TABS
100.0000 mg | ORAL_TABLET | Freq: Two times a day (BID) | ORAL | 3 refills | Status: DC
  Filled 2022-11-04: qty 180, 90d supply, fill #0
  Filled 2023-02-16: qty 180, 90d supply, fill #1
  Filled 2023-05-18 (×3): qty 180, 90d supply, fill #2
  Filled 2023-08-24: qty 180, 90d supply, fill #3

## 2022-11-04 MED ORDER — ONDANSETRON 4 MG PO TBDP
4.0000 mg | ORAL_TABLET | Freq: Three times a day (TID) | ORAL | 3 refills | Status: DC | PRN
  Filled 2022-11-04: qty 10, 4d supply, fill #0
  Filled 2023-04-12: qty 10, 4d supply, fill #1
  Filled 2023-05-18 (×3): qty 10, 4d supply, fill #2
  Filled 2023-07-11: qty 10, 4d supply, fill #3

## 2022-11-04 MED ORDER — QULIPTA 60 MG PO TABS
60.0000 mg | ORAL_TABLET | Freq: Every day | ORAL | 3 refills | Status: DC
  Filled 2022-11-04 – 2023-01-12 (×2): qty 90, 90d supply, fill #0
  Filled 2023-04-12: qty 90, 90d supply, fill #1
  Filled 2023-07-11: qty 90, 90d supply, fill #2
  Filled 2023-10-04 – 2023-10-06 (×2): qty 90, 90d supply, fill #3
  Filled 2023-10-07: qty 30, 30d supply, fill #3
  Filled 2023-11-04: qty 30, 30d supply, fill #4

## 2022-11-04 NOTE — Progress Notes (Signed)
PATIENT: Christy Whitehead DOB: [birth year]  REASON FOR VISIT: follow up HISTORY FROM: patient  Virtual Visit via Telephone Note  I connected with Christy Whitehead on 11/04/22 at  9:15 AM EDT by telephone and verified that I am speaking with the correct person using two identifiers.   I discussed the limitations, risks, security and privacy concerns of performing an evaluation and management service by telephone and the availability of in person appointments. I also discussed with the patient that there may be a patient responsible charge related to this service. The patient expressed understanding and agreed to proceed.   History of Present Illness:  11/04/22 ALL (Mychart): Christy Whitehead is a 37 y.o. female here today for follow up for migraines. She continues Qulipta 60mg  QD, topiramate 100mg  BID and sumatriptan PRN. She feels migraines are well managed. She may have 8-10 mild headaches per month with 2-3 migrainous headaches. Sumatriptan works well. Ibuprofen or Tylenol help with milder headaches. She contineus to work nights. She continues Nuvaring for contraception.   10/24/21 ALL: Christy Whitehead returns for follow up for migraines. She was last seen 08/2020 and switched from [facility] to [place] due to skin irritation and continued on topiramate 100mg  BID. Rizatriptan was switched to sumatriptan for abortive therapy. Since, she feels that she is doing much better. She was having daily headaches, now having about 8 per month. Most are mild and aborted with Tylenol. Sumatriptan helps with severe migraines. Usually takes 1-2 doses per month. Ondansetron helps with nausea. Stress is common trigger. She is exercising multiple times per week. 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    Observations/Objective:  Generalized: Well developed, in no acute distress  Mentation: Alert oriented to time, place, history taking. Follows all commands speech and language fluent   Assessment and  Plan:  37 y.o. year old female  has a past medical history of Abnormal Pap smear of cervix (12/19/2008, 02/20/2009), Abnormal Pap smear of cervix (08/06/2010, 10/01/2010), Cervical dysplasia CIN1 (06/05/2004), CIN III (cervical intraepithelial neoplasia grade III) with severe dysplasia (12/20/2010), Dysmenorrhea, and Migraines. here with    ICD-10-CM   1. Migraine without aura and without status migrainosus, not intractable  G43.009       Christy Whitehead continues to do well on current treatment plan. We will continue Qulipta 60mg  daily, topiramate 100mg  BID and sumatriptan PRN. Healthy lifestyle habits encouraged. She was advised against pregnancy while taking Qulipta and topiramate. She will follow up in 1 year, sooner if needed.   No orders of the defined types were placed in this encounter.   Meds ordered this encounter  Medications   Atogepant (QULIPTA) 60 MG TABS    Sig: Take 1 tablet (60 mg) by mouth daily.    Dispense:  90 tablet    Refill:  3    Order Specific Question:   Supervising Provider    Answer:   Anson Fret [[id number]]   topiramate (TOPAMAX) 100 MG tablet    Sig: Take 1 tablet (100 mg total) by mouth 2 (two) times daily. Please keep upcoming appt in March for continued refills.    Dispense:  180 tablet    Refill:  3    Order Specific Question:   Supervising Provider    Answer:   Anson Fret [[id number]]   ondansetron (ZOFRAN-ODT) 4 MG disintegrating tablet    Sig: Take 1 tablet (4 mg total) by mouth every 8 (eight) hours as needed for nausea or vomiting.    Dispense:  10 tablet    Refill:  3    Order Specific Question:   Supervising Provider    Answer:   Anson Fret [[id number]]   SUMAtriptan (IMITREX) 100 MG tablet    Sig: Take 1 tablet (100 mg total) by mouth once as needed for up to 1 dose for migraine. May repeat in 2 hours if headache persists or recurs.    Dispense:  10 tablet    Refill:  11    Order Specific Question:   Supervising Provider    Answer:    Anson Fret [[id number]]     Follow Up Instructions:  I discussed the assessment and treatment plan with the patient. The patient was provided an opportunity to ask questions and all were answered. The patient agreed with the plan and demonstrated an understanding of the instructions.   The patient was advised to call back or seek an in-person evaluation if the symptoms worsen or if the condition fails to improve as anticipated.  I provided 15 minutes of non-face-to-face time during this encounter. Patient located at their place of residence during Mychart visit. Provider is in the office.    Shawnie Dapper, NP

## 2022-11-04 NOTE — Patient Instructions (Signed)
Below is our plan:  We will continue Qulipta 60mg  daily, topiramate 100mg  twice daily and sumatriptan as needed. I advise against pregnancy on Qulipta and topiramate.   Please make sure you are staying well hydrated. I recommend 50-60 ounces daily. Well balanced diet and regular exercise encouraged. Consistent sleep schedule with 6-8 hours recommended.   Please continue follow up with care team as directed.   Follow up with me in 1 year   You may receive a survey regarding today's visit. I encourage you to leave honest feed back as I do use this information to improve patient care. Thank you for seeing me today!   To prevent or relieve headaches, try the following:  Cool Compress. Lie down and place a cool compress on your head.  Avoid headache triggers. If certain foods or odors seem to have triggered your migraines in the past, avoid them. A headache diary might help you identify triggers.  Include physical activity in your daily routine. Try a daily walk or other moderate aerobic exercise.  Manage stress. Find healthy ways to cope with the stressors, such as delegating tasks on your to-do list.  Practice relaxation techniques. Try deep breathing, yoga, massage and visualization.  Eat regularly. Eating regularly scheduled meals and maintaining a healthy diet might help prevent headaches. Also, drink plenty of fluids.  Follow a regular sleep schedule. Sleep deprivation might contribute to headaches Consider biofeedback. With this mind-body technique, you learn to control certain bodily functions -- such as muscle tension, heart rate and blood pressure -- to prevent headaches or reduce headache pain.      Proceed to emergency room if you experience new or worsening symptoms or symptoms do not resolve, if you have new neurologic symptoms or if headache is severe, or for any concerning symptom.

## 2022-11-09 ENCOUNTER — Encounter: Payer: Self-pay | Admitting: Nurse Practitioner

## 2022-11-10 ENCOUNTER — Other Ambulatory Visit: Payer: Self-pay

## 2022-11-10 MED ORDER — SCOPOLAMINE 1 MG/3DAYS TD PT72
1.0000 | MEDICATED_PATCH | TRANSDERMAL | 0 refills | Status: DC
  Filled 2022-11-10: qty 10, 30d supply, fill #0

## 2022-11-11 ENCOUNTER — Other Ambulatory Visit: Payer: Self-pay

## 2022-12-03 ENCOUNTER — Other Ambulatory Visit: Payer: Self-pay

## 2022-12-03 ENCOUNTER — Other Ambulatory Visit: Payer: Self-pay | Admitting: Nurse Practitioner

## 2022-12-03 MED ORDER — LEVOTHYROXINE SODIUM 100 MCG PO TABS
100.0000 ug | ORAL_TABLET | Freq: Every day | ORAL | 1 refills | Status: DC
  Filled 2022-12-03: qty 90, 90d supply, fill #0

## 2022-12-03 NOTE — Telephone Encounter (Signed)
Requested Prescriptions  Pending Prescriptions Disp Refills   levothyroxine (SYNTHROID) 100 MCG tablet 90 tablet 1    Sig: Take 1 tablet (100 mcg total) by mouth daily.     Endocrinology:  Hypothyroid Agents Failed - 12/03/2022  6:49 AM      Failed - TSH in normal range and within 360 days    TSH  Date Value Ref Range Status  07/29/2022 4.930 (H) 0.450 - 4.500 uIU/mL Final         Passed - Valid encounter within last 12 months    Recent Outpatient Visits           4 months ago Hashimoto's thyroiditis   [place] [facility] [place], [place] T, NP   5 months ago Hashimoto's thyroiditis   [place] [facility] Gabriel Cirri, NP   6 months ago Hashimoto's thyroiditis   [place] [facility] [place], [place] T, NP   7 months ago Hashimoto's thyroiditis   [place] [facility] [place], [place] T, NP   9 months ago Depression, major, single episode, moderate (HCC)   [place] [facility] [place], Dorie Rank, NP       Future Appointments             In 1 month Cannady, Dorie Rank, NP [place] [facility]

## 2023-01-12 ENCOUNTER — Other Ambulatory Visit: Payer: Self-pay

## 2023-01-26 NOTE — Patient Instructions (Incomplete)
Eye twitching can be from fatigue and stress too, ensure you are getting 8 hours of sleep daily and try meditation/relaxation techniques to help with stress.  Also lower caffeine intake if you drink this.  Be Involved in Caring For Your Health:  Taking Medications When medications are taken as directed, they can greatly improve your health. But if they are not taken as prescribed, they may not work. In some cases, not taking them correctly can be harmful. To help ensure your treatment remains effective and safe, understand your medications and how to take them. Bring your medications to each visit for review by your provider.  Your lab results, notes, and after visit summary will be available on My Chart. We strongly encourage you to use this feature. If lab results are abnormal the clinic will contact you with the appropriate steps. If the clinic does not contact you assume the results are satisfactory. You can always view your results on My Chart. If you have questions regarding your health or results, please contact the clinic during office hours. You can also ask questions on My Chart.  We at [facility] are grateful that you chose [place] to provide your care. We strive to provide evidence-based and compassionate care and are always looking for feedback. If you get a survey from the clinic please complete this so we can hear your opinions.  Hypothyroidism  Hypothyroidism is when the thyroid gland does not make enough of certain hormones. This is called an underactive thyroid. The thyroid gland is a small gland located in the lower front part of the neck, just in front of the windpipe (trachea). This gland makes hormones that help control how the body uses food for energy (metabolism) as well as how the heart and brain function. These hormones also play a role in keeping your bones strong. When the thyroid is underactive, it produces too little of the hormones thyroxine (T4) and  triiodothyronine (T3). What are the causes? This condition may be caused by: Hashimoto's disease. This is a disease in which the body's disease-fighting system (immune system) attacks the thyroid gland. This is the most common cause. Viral infections. Pregnancy. Certain medicines. Birth defects. Problems with a gland in the center of the brain (pituitary gland). Lack of enough iodine in the diet. Other causes may include: Past radiation treatments to the head or neck for cancer. Past treatment with radioactive iodine. Past exposure to radiation in the environment. Past surgical removal of part or all of the thyroid. What increases the risk? You are more likely to develop this condition if: You are female. You have a family history of thyroid conditions. You use a medicine called lithium. You take medicines that affect the immune system (immunosuppressants). What are the signs or symptoms? Common symptoms of this condition include: Not being able to tolerate cold. Feeling as though you have no energy (lethargy). Lack of appetite. Constipation. Sadness or depression. Weight gain that is not explained by a change in diet or exercise habits. Menstrual irregularity. Dry skin, coarse hair, or brittle nails. Other symptoms may include: Muscle pain. Slowing of thought processes. Poor memory. How is this diagnosed? This condition may be diagnosed based on: Your symptoms, your medical history, and a physical exam. Blood tests. You may also have imaging tests, such as an ultrasound or MRI. How is this treated? This condition is treated with medicine that replaces the thyroid hormones that your body does not make. After you begin treatment, it may take  several weeks for symptoms to go away. Follow these instructions at home: Take over-the-counter and prescription medicines only as told by your health care provider. If you start taking any new medicines, tell your health care  provider. Keep all follow-up visits as told by your health care provider. This is important. As your condition improves, your dosage of thyroid hormone medicine may change. You will need to have blood tests regularly so that your health care provider can monitor your condition. Contact a health care provider if: Your symptoms do not get better with treatment. You are taking thyroid hormone replacement medicine and you: Sweat a lot. Have tremors. Feel anxious. Lose weight rapidly. Cannot tolerate heat. Have emotional swings. Have diarrhea. Feel weak. Get help right away if: You have chest pain. You have an irregular heartbeat. You have a rapid heartbeat. You have difficulty breathing. These symptoms may be an emergency. Get help right away. Call 911. Do not wait to see if the symptoms will go away. Do not drive yourself to the hospital. Summary Hypothyroidism is when the thyroid gland does not make enough of certain hormones (it is underactive). When the thyroid is underactive, it produces too little of the hormones thyroxine (T4) and triiodothyronine (T3). The most common cause is Hashimoto's disease, a disease in which the body's disease-fighting system (immune system) attacks the thyroid gland. The condition can also be caused by viral infections, medicine, pregnancy, or past radiation treatment to the head or neck. Symptoms may include weight gain, dry skin, constipation, feeling as though you do not have energy, and not being able to tolerate cold. This condition is treated with medicine to replace the thyroid hormones that your body does not make. This information is not intended to replace advice given to you by your health care provider. Make sure you discuss any questions you have with your health care provider. Document Revised: 05/14/2021 Document Reviewed: 05/14/2021 Elsevier Patient Education  2024 [employer].

## 2023-01-29 ENCOUNTER — Other Ambulatory Visit: Payer: Self-pay

## 2023-01-29 ENCOUNTER — Ambulatory Visit (INDEPENDENT_AMBULATORY_CARE_PROVIDER_SITE_OTHER): Payer: 59 | Admitting: Nurse Practitioner

## 2023-01-29 ENCOUNTER — Encounter: Payer: Self-pay | Admitting: Nurse Practitioner

## 2023-01-29 VITALS — BP 99/67 | HR 66 | Temp 97.9°F | Ht 64.0 in | Wt 147.8 lb

## 2023-01-29 DIAGNOSIS — Z6825 Body mass index (BMI) 25.0-25.9, adult: Secondary | ICD-10-CM

## 2023-01-29 DIAGNOSIS — Z6830 Body mass index (BMI) 30.0-30.9, adult: Secondary | ICD-10-CM

## 2023-01-29 DIAGNOSIS — F411 Generalized anxiety disorder: Secondary | ICD-10-CM | POA: Diagnosis not present

## 2023-01-29 DIAGNOSIS — E063 Autoimmune thyroiditis: Secondary | ICD-10-CM

## 2023-01-29 DIAGNOSIS — F321 Major depressive disorder, single episode, moderate: Secondary | ICD-10-CM | POA: Diagnosis not present

## 2023-01-29 MED ORDER — ZEPBOUND 10 MG/0.5ML ~~LOC~~ SOAJ
10.0000 mg | SUBCUTANEOUS | 12 refills | Status: DC
  Filled 2023-01-29: qty 2, 28d supply, fill #0
  Filled 2023-03-13 – 2023-04-12 (×2): qty 2, 28d supply, fill #1
  Filled 2023-06-12: qty 2, 28d supply, fill #2
  Filled 2023-07-11 – 2023-10-04 (×2): qty 2, 28d supply, fill #3
  Filled 2023-12-14: qty 2, 28d supply, fill #4

## 2023-01-29 MED ORDER — NYSTATIN 100000 UNIT/GM EX CREA
1.0000 | TOPICAL_CREAM | Freq: Two times a day (BID) | CUTANEOUS | 2 refills | Status: DC
  Filled 2023-01-29: qty 30, 15d supply, fill #0

## 2023-01-29 NOTE — Assessment & Plan Note (Signed)
Refer to depression plan of care. 

## 2023-01-29 NOTE — Assessment & Plan Note (Signed)
Chronic, ongoing with some increased symptoms.  Will recheck today to ensure and continue current dosing, unless labs show need to change.

## 2023-01-29 NOTE — Assessment & Plan Note (Signed)
Chronic, ongoing.  Denies SI/HI.  At this time continue therapy and consider starting medication in future as needed -- would be cautious with Wellbutrin if used due to her anxiety level.  Discussed SSRIs.  Check thyroid labs.

## 2023-01-29 NOTE — Progress Notes (Signed)
BP 99/67   Pulse 66   Temp 97.9 F (36.6 C) (Oral)   Ht 5\' 4"  (1.626 m)   Wt 147 lb 12.8 oz (67 kg)   SpO2 97%   BMI 25.37 kg/m    Subjective:    Patient ID: Christy Whitehead, female    DOB: [birth year], 37 y.o.   MRN: [id number]  HPI: Christy Whitehead is a 37 y.o. female  Chief Complaint  Patient presents with   Hashimoto's Thyroiditis   Obesity   HYPOTHYROIDISM Continues on 100 MCG Levothyroxine, but feels like symptoms are coming back.  Last TSH was 4.930, missed recheck on this.  Is sweating more recently.  Is getting rash under armpits which is irritating.   Thyroid control status:stable Satisfied with current treatment? yes Medication side effects: no Medication compliance: good compliance Etiology of hypothyroidism:  Recent dose adjustment:no Fatigue: yes Cold intolerance: yes Heat intolerance: yes Weight gain: no Weight loss: no Constipation: yes -- sometimes Diarrhea/loose stools: no Palpitations: no Lower extremity edema: no Anxiety/depressed mood:  occasional, but has stressors at present      01/29/2023    8:37 AM 07/29/2022    9:58 AM 06/17/2022    2:32 PM 05/20/2022    2:17 PM 04/22/2022    8:30 AM  Depression screen PHQ 2/9  Decreased Interest 1 0 1 1 2   Down, Depressed, Hopeless 1 0 0 0 1  PHQ - 2 Score 2 0 1 1 3   Altered sleeping 2 1 2 2 2   Tired, decreased energy 2 1 1 1 3   Change in appetite 1 1 0 1 2  Feeling bad or failure about yourself  0 0 0 0 1  Trouble concentrating 1 0 0 0 0  Moving slowly or fidgety/restless 0 0 0 0 0  Suicidal thoughts 0 0 0 0 0  PHQ-9 Score 8 3 4 5 11   Difficult doing work/chores Somewhat difficult Not difficult at all Somewhat difficult Not difficult at all        01/29/2023    8:38 AM 07/29/2022    9:58 AM 06/17/2022    2:33 PM 05/20/2022    2:17 PM  GAD 7 : Generalized Anxiety Score  Nervous, Anxious, on Edge 1 0 1 0  Control/stop worrying 1 0 0 0  Worry too much - different things 1 0 0 1   Trouble relaxing 0 0 0 0  Restless 0 0 1 0  Easily annoyed or irritable 1 1 0 2  Afraid - awful might happen 1 0 0 0  Total GAD 7 Score 5 1 2 3   Anxiety Difficulty Somewhat difficult Not difficult at all Not difficult at all Not difficult at all      WEIGHT GAIN Continues on Zepbound 5 MG weekly.  Paying out of pocket.  Has lost with this and is at goal. Duration: months Previous attempts at weight loss: yes Complications of obesity: HLD Weight loss goal: 145 to 150 Weight loss to date: 23 pounds Requesting obesity pharmacotherapy:  currently taking Current weight loss supplements/medications: yes Previous weight loss supplements/meds: no Calories: 1800  Relevant past medical, surgical, family and social history reviewed and updated as indicated. Interim medical history since our last visit reviewed. Allergies and medications reviewed and updated.  Review of Systems  Constitutional:  Positive for fatigue. Negative for activity change, appetite change, diaphoresis and fever.  Respiratory:  Negative for cough, chest tightness and shortness of breath.   Cardiovascular:  Negative  for chest pain, palpitations and leg swelling.  Gastrointestinal:  Positive for constipation (occasional).  Endocrine: Positive for cold intolerance and heat intolerance.  Neurological:  Negative for dizziness, syncope, weakness, light-headedness, numbness and headaches.  Psychiatric/Behavioral:  Negative for decreased concentration, self-injury, sleep disturbance and suicidal ideas. The patient is nervous/anxious (increased stressors).    Per HPI unless specifically indicated above     Objective:    BP 99/67   Pulse 66   Temp 97.9 F (36.6 C) (Oral)   Ht 5\' 4"  (1.626 m)   Wt 147 lb 12.8 oz (67 kg)   SpO2 97%   BMI 25.37 kg/m   Wt Readings from Last 3 Encounters:  01/29/23 147 lb 12.8 oz (67 kg)  08/04/22 170 lb (77.1 kg)  07/29/22 168 lb 11.2 oz (76.5 kg)    Physical Exam Vitals and  nursing note reviewed.  Constitutional:      General: She is awake. She is not in acute distress.    Appearance: She is well-developed, well-groomed and overweight. She is not ill-appearing or toxic-appearing.  HENT:     Head: Normocephalic.     Right Ear: Hearing normal.     Left Ear: Hearing normal.  Eyes:     General: Lids are normal.        Right eye: No discharge.        Left eye: No discharge.     Conjunctiva/sclera: Conjunctivae normal.     Pupils: Pupils are equal, round, and reactive to light.  Neck:     Thyroid: No thyromegaly.     Vascular: No carotid bruit.  Cardiovascular:     Rate and Rhythm: Normal rate and regular rhythm.     Heart sounds: Normal heart sounds. No murmur heard.    No gallop.  Pulmonary:     Effort: Pulmonary effort is normal. No accessory muscle usage or respiratory distress.     Breath sounds: Normal breath sounds.  Abdominal:     General: Bowel sounds are normal.     Palpations: Abdomen is soft.  Musculoskeletal:     Cervical back: Normal range of motion and neck supple.     Right lower leg: No edema.     Left lower leg: No edema.  Lymphadenopathy:     Cervical: No cervical adenopathy.  Skin:    General: Skin is warm and dry.  Neurological:     Mental Status: She is alert and oriented to person, place, and time.  Psychiatric:        Attention and Perception: Attention normal.        Mood and Affect: Mood normal.        Speech: Speech normal.        Behavior: Behavior normal. Behavior is cooperative.        Thought Content: Thought content normal.     Results for orders placed or performed in visit on 07/29/22  T4, free  Result Value Ref Range   Free T4 1.37 0.82 - 1.77 ng/dL  TSH  Result Value Ref Range   TSH 4.930 (H) 0.450 - 4.500 uIU/mL      Assessment & Plan:   Problem List Items Addressed This Visit       Endocrine   Hashimoto's thyroiditis    Chronic, ongoing with some increased symptoms.  Will recheck today to  ensure and continue current dosing, unless labs show need to change.       Relevant Orders   T4, free  TSH     Other   BMI 25.0-25.9,adult    Continues on Zepbound, has reached weight loss goal.  Pays OOP for Zepbound, will send in refills of 10 MG (she will split to = 5 MG weekly), however if ongoing weight loss or becomes underweight will recommend stopping or lowering this.      RESOLVED: Body mass index 30.0-30.9, adult   Depression, major, single episode, moderate (HCC) - Primary    Chronic, ongoing.  Denies SI/HI.  At this time continue therapy and consider starting medication in future as needed -- would be cautious with Wellbutrin if used due to her anxiety level.  Discussed SSRIs.  Check thyroid labs.      Generalized anxiety disorder    Refer to depression plan of care.        Follow up plan: Return in about 8 weeks (around 03/23/2023) for Annual Physical.

## 2023-01-29 NOTE — Assessment & Plan Note (Signed)
Continues on Zepbound, has reached weight loss goal.  Pays OOP for Zepbound, will send in refills of 10 MG (she will split to = 5 MG weekly), however if ongoing weight loss or becomes underweight will recommend stopping or lowering this.

## 2023-01-30 ENCOUNTER — Other Ambulatory Visit: Payer: Self-pay

## 2023-01-30 ENCOUNTER — Other Ambulatory Visit: Payer: Self-pay | Admitting: Nurse Practitioner

## 2023-01-30 DIAGNOSIS — E063 Autoimmune thyroiditis: Secondary | ICD-10-CM

## 2023-01-30 LAB — TSH: TSH: 4.75 u[IU]/mL — ABNORMAL HIGH (ref 0.450–4.500)

## 2023-01-30 LAB — T4, FREE: Free T4: 1.24 ng/dL (ref 0.82–1.77)

## 2023-01-30 MED ORDER — LEVOTHYROXINE SODIUM 125 MCG PO TABS
125.0000 ug | ORAL_TABLET | Freq: Every day | ORAL | 2 refills | Status: DC
  Filled 2023-01-30: qty 90, 90d supply, fill #0
  Filled 2023-05-01: qty 90, 90d supply, fill #1

## 2023-02-03 NOTE — Progress Notes (Signed)
Called and scheduled patient on 03/12/2023 @ 8:20 am.

## 2023-02-26 ENCOUNTER — Other Ambulatory Visit (HOSPITAL_BASED_OUTPATIENT_CLINIC_OR_DEPARTMENT_OTHER): Payer: Self-pay

## 2023-02-26 MED ORDER — INFLUENZA VIRUS VACC SPLIT PF (FLUZONE) 0.5 ML IM SUSY
0.5000 mL | PREFILLED_SYRINGE | Freq: Once | INTRAMUSCULAR | 0 refills | Status: AC
  Filled 2023-02-26: qty 0.5, 1d supply, fill #0

## 2023-03-12 ENCOUNTER — Other Ambulatory Visit: Payer: 59

## 2023-03-12 DIAGNOSIS — E063 Autoimmune thyroiditis: Secondary | ICD-10-CM | POA: Diagnosis not present

## 2023-03-13 ENCOUNTER — Other Ambulatory Visit: Payer: Self-pay

## 2023-03-13 NOTE — Progress Notes (Signed)
Contacted via MyChart   Good afternoon Christy Whitehead, so far I only have Free T4 which is normal, waiting on TSH:)

## 2023-03-14 LAB — T4, FREE: Free T4: 1.5 ng/dL (ref 0.82–1.77)

## 2023-03-14 LAB — TSH: TSH: 0.876 u[IU]/mL (ref 0.450–4.500)

## 2023-03-14 NOTE — Progress Notes (Signed)
Contacted via MyChart   Good morning Daisee, your thyroid labs have returned and TSH is in normal range now, as is T4.  Continue current Levothyroxine dosing.  Any questions? Keep being stellar!!  Thank you for allowing me to participate in your care.  I appreciate you. Kindest regards, Bohdan Macho

## 2023-03-25 ENCOUNTER — Other Ambulatory Visit: Payer: Self-pay

## 2023-03-31 ENCOUNTER — Encounter: Payer: 59 | Admitting: Nurse Practitioner

## 2023-03-31 DIAGNOSIS — F411 Generalized anxiety disorder: Secondary | ICD-10-CM

## 2023-03-31 DIAGNOSIS — E559 Vitamin D deficiency, unspecified: Secondary | ICD-10-CM

## 2023-03-31 DIAGNOSIS — Z13220 Encounter for screening for lipoid disorders: Secondary | ICD-10-CM

## 2023-03-31 DIAGNOSIS — G43709 Chronic migraine without aura, not intractable, without status migrainosus: Secondary | ICD-10-CM

## 2023-03-31 DIAGNOSIS — Z0000 Encounter for general adult medical examination without abnormal findings: Secondary | ICD-10-CM

## 2023-03-31 DIAGNOSIS — E063 Autoimmune thyroiditis: Secondary | ICD-10-CM

## 2023-03-31 DIAGNOSIS — F321 Major depressive disorder, single episode, moderate: Secondary | ICD-10-CM

## 2023-03-31 DIAGNOSIS — Z6825 Body mass index (BMI) 25.0-25.9, adult: Secondary | ICD-10-CM

## 2023-04-12 NOTE — Patient Instructions (Signed)
 Be Involved in Caring For Your Health:  Taking Medications When medications are taken as directed, they can greatly improve your health. But if they are not taken as prescribed, they may not work. In some cases, not taking them correctly can be harmful. To help ensure your treatment remains effective and safe, understand your medications and how to take them. Bring your medications to each visit for review by your provider.  Your lab results, notes, and after visit summary will be available on My Chart. We strongly encourage you to use this feature. If lab results are abnormal the clinic will contact you with the appropriate steps. If the clinic does not contact you assume the results are satisfactory. You can always view your results on My Chart. If you have questions regarding your health or results, please contact the clinic during office hours. You can also ask questions on My Chart.  We at [facility] are grateful that you chose [place] to provide your care. We strive to provide evidence-based and compassionate care and are always looking for feedback. If you get a survey from the clinic please complete this so we can hear your opinions.  Hypothyroidism  Hypothyroidism is when the thyroid gland does not make enough of certain hormones. This is called an underactive thyroid. The thyroid gland is a small gland located in the lower front part of the neck, just in front of the windpipe (trachea). This gland makes hormones that help control how the body uses food for energy (metabolism) as well as how the heart and brain function. These hormones also play a role in keeping your bones strong. When the thyroid is underactive, it produces too little of the hormones thyroxine (T4) and triiodothyronine (T3). What are the causes? This condition may be caused by: Hashimoto's disease. This is a disease in which the body's disease-fighting system (immune system) attacks the thyroid gland. This is the  most common cause. Viral infections. Pregnancy. Certain medicines. Birth defects. Problems with a gland in the center of the brain (pituitary gland). Lack of enough iodine in the diet. Other causes may include: Past radiation treatments to the head or neck for cancer. Past treatment with radioactive iodine. Past exposure to radiation in the environment. Past surgical removal of part or all of the thyroid. What increases the risk? You are more likely to develop this condition if: You are female. You have a family history of thyroid conditions. You use a medicine called lithium. You take medicines that affect the immune system (immunosuppressants). What are the signs or symptoms? Common symptoms of this condition include: Not being able to tolerate cold. Feeling as though you have no energy (lethargy). Lack of appetite. Constipation. Sadness or depression. Weight gain that is not explained by a change in diet or exercise habits. Menstrual irregularity. Dry skin, coarse hair, or brittle nails. Other symptoms may include: Muscle pain. Slowing of thought processes. Poor memory. How is this diagnosed? This condition may be diagnosed based on: Your symptoms, your medical history, and a physical exam. Blood tests. You may also have imaging tests, such as an ultrasound or MRI. How is this treated? This condition is treated with medicine that replaces the thyroid hormones that your body does not make. After you begin treatment, it may take several weeks for symptoms to go away. Follow these instructions at home: Take over-the-counter and prescription medicines only as told by your health care provider. If you start taking any new medicines, tell your health care  provider. Keep all follow-up visits as told by your health care provider. This is important. As your condition improves, your dosage of thyroid hormone medicine may change. You will need to have blood tests regularly so that  your health care provider can monitor your condition. Contact a health care provider if: Your symptoms do not get better with treatment. You are taking thyroid hormone replacement medicine and you: Sweat a lot. Have tremors. Feel anxious. Lose weight rapidly. Cannot tolerate heat. Have emotional swings. Have diarrhea. Feel weak. Get help right away if: You have chest pain. You have an irregular heartbeat. You have a rapid heartbeat. You have difficulty breathing. These symptoms may be an emergency. Get help right away. Call 911. Do not wait to see if the symptoms will go away. Do not drive yourself to the hospital. Summary Hypothyroidism is when the thyroid gland does not make enough of certain hormones (it is underactive). When the thyroid is underactive, it produces too little of the hormones thyroxine (T4) and triiodothyronine (T3). The most common cause is Hashimoto's disease, a disease in which the body's disease-fighting system (immune system) attacks the thyroid gland. The condition can also be caused by viral infections, medicine, pregnancy, or past radiation treatment to the head or neck. Symptoms may include weight gain, dry skin, constipation, feeling as though you do not have energy, and not being able to tolerate cold. This condition is treated with medicine to replace the thyroid hormones that your body does not make. This information is not intended to replace advice given to you by your health care provider. Make sure you discuss any questions you have with your health care provider. Document Revised: 05/14/2021 Document Reviewed: 05/14/2021 Elsevier Patient Education  2024 [employer].

## 2023-04-13 ENCOUNTER — Other Ambulatory Visit: Payer: Self-pay

## 2023-04-17 ENCOUNTER — Encounter: Payer: Self-pay | Admitting: Nurse Practitioner

## 2023-04-17 ENCOUNTER — Ambulatory Visit (INDEPENDENT_AMBULATORY_CARE_PROVIDER_SITE_OTHER): Payer: 59 | Admitting: Nurse Practitioner

## 2023-04-17 VITALS — BP 93/60 | HR 85 | Temp 97.6°F | Ht 64.0 in | Wt 147.8 lb

## 2023-04-17 DIAGNOSIS — Z13220 Encounter for screening for lipoid disorders: Secondary | ICD-10-CM | POA: Diagnosis not present

## 2023-04-17 DIAGNOSIS — G43709 Chronic migraine without aura, not intractable, without status migrainosus: Secondary | ICD-10-CM | POA: Diagnosis not present

## 2023-04-17 DIAGNOSIS — F321 Major depressive disorder, single episode, moderate: Secondary | ICD-10-CM | POA: Diagnosis not present

## 2023-04-17 DIAGNOSIS — Z6825 Body mass index (BMI) 25.0-25.9, adult: Secondary | ICD-10-CM

## 2023-04-17 DIAGNOSIS — G245 Blepharospasm: Secondary | ICD-10-CM | POA: Diagnosis not present

## 2023-04-17 DIAGNOSIS — Z136 Encounter for screening for cardiovascular disorders: Secondary | ICD-10-CM

## 2023-04-17 DIAGNOSIS — E063 Autoimmune thyroiditis: Secondary | ICD-10-CM

## 2023-04-17 DIAGNOSIS — Z0000 Encounter for general adult medical examination without abnormal findings: Secondary | ICD-10-CM | POA: Diagnosis not present

## 2023-04-17 DIAGNOSIS — E559 Vitamin D deficiency, unspecified: Secondary | ICD-10-CM

## 2023-04-17 DIAGNOSIS — F411 Generalized anxiety disorder: Secondary | ICD-10-CM | POA: Diagnosis not present

## 2023-04-17 HISTORY — DX: Blepharospasm: G24.5

## 2023-04-17 NOTE — Assessment & Plan Note (Signed)
Refer to depression plan of care.  Level on GAD increased at present due to situational stressors.

## 2023-04-17 NOTE — Assessment & Plan Note (Signed)
Chronic, ongoing.  Denies SI/HI.  At this time continue therapy as needed and consider starting medication in future as needed -- would be cautious with Wellbutrin if used due to her anxiety level.  Discussed SSRIs.  Check thyroid labs.

## 2023-04-17 NOTE — Assessment & Plan Note (Signed)
Ongoing intermittent issue since 01/29/23.  She has cut back on caffeine and energy drinks with continued twitching to right eye.  Check ANA, CRP, ESR, Magnesium, Lyme testing, and CBC/CMP today.  Recommend she visit with eye doctor and wear glasses prescribed.  If ongoing will consider imaging and referral to neurology.

## 2023-04-17 NOTE — Assessment & Plan Note (Signed)
Chronic, stable.  Continue collaboration with neurology and current regimen as ordered by them.  Recent note reviewed.

## 2023-04-17 NOTE — Assessment & Plan Note (Signed)
Chronic, ongoing.  Will recheck today to ensure ongoing stable levels and continue current dosing, unless labs show need to change.

## 2023-04-17 NOTE — Assessment & Plan Note (Signed)
Ongoing, is taking supplement.  Continue this and check Vit D level today.

## 2023-04-17 NOTE — Progress Notes (Signed)
BP 93/60   Pulse 85   Temp 97.6 F (36.4 C) (Oral)   Ht 5\' 4"  (1.626 m)   Wt 147 lb 12.8 oz (67 kg)   LMP 03/13/2023 (Exact Date)   SpO2 98%   BMI 25.37 kg/m    Subjective:    Patient ID: Christy Whitehead, female    DOB: [birth year], 37 y.o.   MRN: [id number]  HPI: Christy Whitehead is a 37 y.o. female presenting on 04/17/2023 for comprehensive medical examination. Current medical complaints include:none  She currently lives with: self Menopausal Symptoms: no  EYE TWITCHING (RIGHT EYE): Ongoing since 01/29/23, did have brief period of it stopping when she took Ashwagandha, but then ran out and twitching returned.  When she restarted this the twitching continued.  She cut back on caffeine and energy drinks.  No vision changes reported.  Is to be wearing glasses.  Has not seen eye doctor recently.  Work night shift.  HYPOTHYROIDISM Continues on Levothyroxine 125 MCG.  Has underlying migraines, follows with neurology.  Had migraine yesterday and today, but she was out [place].  Has more anxiety recently due to her father and health issues. Thyroid control status:stable Satisfied with current treatment? yes Medication side effects: no Medication compliance: good compliance Etiology of hypothyroidism: Hashimoto's Recent dose adjustment:no Fatigue: no Cold intolerance: no Heat intolerance: no Weight gain: no Weight loss: no Constipation: no Diarrhea/loose stools: no Palpitations: no Lower extremity edema: no Anxiety/depressed mood: no   Depression Screen done today and results listed below:     04/17/2023    9:15 AM 01/29/2023    8:37 AM 07/29/2022    9:58 AM 06/17/2022    2:32 PM 05/20/2022    2:17 PM  Depression screen PHQ 2/9  Decreased Interest 1 1 0 1 1  Down, Depressed, Hopeless 0 1 0 0 0  PHQ - 2 Score 1 2 0 1 1  Altered sleeping 1 2 1 2 2   Tired, decreased energy 1 2 1 1 1   Change in appetite 0 1 1 0 1  Feeling bad or failure about yourself  0 0  0 0 0  Trouble concentrating 0 1 0 0 0  Moving slowly or fidgety/restless 0 0 0 0 0  Suicidal thoughts 0 0 0 0 0  PHQ-9 Score 3 8 3 4 5   Difficult doing work/chores Not difficult at all Somewhat difficult Not difficult at all Somewhat difficult Not difficult at all      04/17/2023    9:15 AM 01/29/2023    8:38 AM 07/29/2022    9:58 AM 06/17/2022    2:33 PM  GAD 7 : Generalized Anxiety Score  Nervous, Anxious, on Edge 1 1 0 1  Control/stop worrying 1 1 0 0  Worry too much - different things 2 1 0 0  Trouble relaxing 1 0 0 0  Restless 2 0 0 1  Easily annoyed or irritable 2 1 1  0  Afraid - awful might happen 2 1 0 0  Total GAD 7 Score 11 5 1 2   Anxiety Difficulty Somewhat difficult Somewhat difficult Not difficult at all Not difficult at all      05/20/2022    2:17 PM 06/17/2022    2:32 PM 01/29/2023    8:36 AM 04/17/2023    9:13 AM  Fall Risk  Falls in the past year? 0 0 0 0  Was there an injury with Fall? 0 0 0 0  Fall Risk Category Calculator  0 0 0 0  Fall Risk Category (Retired) Low     Patient at Risk for Falls Due to No Fall Risks No Fall Risks No Fall Risks No Fall Risks  Fall risk Follow up Falls evaluation completed Falls evaluation completed Falls evaluation completed Falls evaluation completed      Functional Status Survey: Is the patient deaf or have difficulty hearing?: No Does the patient have difficulty seeing, even when wearing glasses/contacts?: No Does the patient have difficulty concentrating, remembering, or making decisions?: No Does the patient have difficulty walking or climbing stairs?: No Does the patient have difficulty dressing or bathing?: No Does the patient have difficulty doing errands alone such as visiting a doctor's office or shopping?: No    Past Medical History:  Past Medical History:  Diagnosis Date   Abnormal Pap smear of cervix 12/19/2008, 02/20/2009   ASCUS   Abnormal Pap smear of cervix 08/06/2010, 10/01/2010   HGSIL   Cervical dysplasia  CIN1 06/05/2004   CIN III (cervical intraepithelial neoplasia grade III) with severe dysplasia 12/20/2010   LEEP Ecto Cervix   Dysmenorrhea    Migraines     Surgical History:  Past Surgical History:  Procedure Laterality Date   CHOLECYSTECTOMY  2015   COLPOSCOPY  06/05/2004,02/20/2009,10/01/2010   LEEP  12/20/2010   ECC &Endo Cx Negative, Ecto CIN 2-3    Medications:  Current Outpatient Medications on File Prior to Visit  Medication Sig   acetaminophen (TYLENOL) 500 MG tablet Take 500 mg by mouth every 6 (six) hours as needed.   Aspirin-Acetaminophen-Caffeine (EXCEDRIN PO) Take by mouth as needed.   Atogepant (QULIPTA) 60 MG TABS Take 1 tablet (60 mg) by mouth daily.   Cholecalciferol 25 MCG (1000 UT) capsule Take 2,000 Units by mouth daily.   cyanocobalamin (VITAMIN B12) 1000 MCG tablet Take 1,000 mcg by mouth daily.   etonogestrel-ethinyl estradiol (NUVARING) 0.12-0.015 MG/24HR vaginal ring INSERT VAGINALLY AND LEAVE IN PLACE FOR 3 CONSECUTIVE WEEKS, THEN REPLACE FOR CONTINUOUS DOSING DUE TO HEADACHES   Insulin Pen Needle (UNIFINE PENTIPS) 31G X 5 MM MISC Use with Saxenda   levothyroxine (SYNTHROID) 125 MCG tablet Take 1 tablet (125 mcg total) by mouth daily.   nystatin cream (MYCOSTATIN) Apply 1 Application topically 2 (two) times daily.   ondansetron (ZOFRAN-ODT) 4 MG disintegrating tablet Take 1 tablet (4 mg total) by mouth every 8 (eight) hours as needed for nausea or vomiting.   scopolamine (TRANSDERM-SCOP) 1 MG/3DAYS Place 1 patch (1.5 mg total) onto the skin every 3 (three) days.   SUMAtriptan (IMITREX) 100 MG tablet Take 1 tablet (100 mg total) by mouth once as needed for up to 1 dose for migraine. May repeat in 2 hours if headache persists or recurs.   tirzepatide (ZEPBOUND) 10 MG/0.5ML Pen Inject 10 mg into the skin once a week.   topiramate (TOPAMAX) 100 MG tablet Take 1 tablet (100 mg total) by mouth 2 (two) times daily. Please keep upcoming appt in March for continued  refills.   No current facility-administered medications on file prior to visit.    Allergies:  Allergies  Allergen Reactions   Aimovig [Erenumab-Aooe] Hives    Site reaction   Emgality [Galcanezumab-Gnlm] Hives    Site reaction    Social History:  Social History   Socioeconomic History   Marital status: Single    Spouse name: Not on file   Number of children: 1   Years of education: College   Highest education level: Bachelor's degree (e.g.,  BA, AB, BS)  Occupational History    Comment: RN in ED, night shift  Tobacco Use   Smoking status: Former    Current packs/day: 0.00    Average packs/day: 1 pack/day for 13.0 years (13.0 ttl pk-yrs)    Types: Cigarettes    Start date: 2005    Quit date: 2018    Years since quitting: 6.8   Smokeless tobacco: Never  Vaping Use   Vaping status: Never Used  Substance and Sexual Activity   Alcohol use: Yes    Comment: once every 2 weeks, occasionally >4   Drug use: No   Sexual activity: Yes    Birth control/protection: Inserts    Comment: nuvaring  Other Topics Concern   Not on file  Social History Narrative   Works as an Nutritional therapist at [employer]   One Daughter - Sherol Dade - 34 years old now   Family support: dad is nearby and watches daughter   Enjoys: works a lot, spend time with friends, hiking, corn-hole, beach   Exercise: gym regularly - 1-2 since the gym closed   Diet: pretty healthy overall   Social Determinants of Health   Financial Resource Strain: Low Risk  (03/06/2022)   Overall Financial Resource Strain (CARDIA)    Difficulty of Paying Living Expenses: Not hard at all  Food Insecurity: No Food Insecurity (03/06/2022)   Hunger Vital Sign    Worried About Running Out of Food in the Last Year: Never true    Ran Out of Food in the Last Year: Never true  Transportation Needs: No Transportation Needs (03/06/2022)   PRAPARE - Administrator, Civil Service (Medical): No    Lack of Transportation (Non-Medical): No   Physical Activity: Sufficiently Active (03/06/2022)   Exercise Vital Sign    Days of Exercise per Week: 5 days    Minutes of Exercise per Session: 30 min  Stress: Stress Concern Present (03/06/2022)   [employer] of Occupational Health - Occupational Stress Questionnaire    Feeling of Stress : To some extent  Social Connections: Moderately Isolated (03/06/2022)   Social Connection and Isolation Panel [NHANES]    Frequency of Communication with Friends and Family: More than three times a week    Frequency of Social Gatherings with Friends and Family: More than three times a week    Attends Religious Services: More than 4 times per year    Active Member of [employer] or Organizations: No    Attends Banker Meetings: Never    Marital Status: Separated  Intimate Partner Violence: Not At Risk (03/06/2022)   Humiliation, Afraid, Rape, and Kick questionnaire    Fear of Current or Ex-Partner: No    Emotionally Abused: No    Physically Abused: No    Sexually Abused: No   Social History   Tobacco Use  Smoking Status Former   Current packs/day: 0.00   Average packs/day: 1 pack/day for 13.0 years (13.0 ttl pk-yrs)   Types: Cigarettes   Start date: 2005   Quit date: 2018   Years since quitting: 6.8  Smokeless Tobacco Never   Social History   Substance and Sexual Activity  Alcohol Use Yes   Comment: once every 2 weeks, occasionally >4    Family History:  Family History  Problem Relation Age of Onset   COPD Mother    Diabetes Mother    Diabetes Father    Hypertension Father    Bipolar disorder Father  Breast cancer Paternal Grandmother 33   Diabetes Sister    Obesity Sister        morbid   Mental retardation Sister    Healthy Daughter    Suicidality Paternal Grandfather     Past medical history, surgical history, medications, allergies, family history and social history reviewed with patient today and changes made to appropriate areas of the chart.    ROS All other ROS negative except what is listed above and in the HPI.      Objective:    BP 93/60   Pulse 85   Temp 97.6 F (36.4 C) (Oral)   Ht 5\' 4"  (1.626 m)   Wt 147 lb 12.8 oz (67 kg)   LMP 03/13/2023 (Exact Date)   SpO2 98%   BMI 25.37 kg/m   Wt Readings from Last 3 Encounters:  04/17/23 147 lb 12.8 oz (67 kg)  01/29/23 147 lb 12.8 oz (67 kg)  08/04/22 170 lb (77.1 kg)    Physical Exam Vitals and nursing note reviewed. Exam conducted with a chaperone present.  Constitutional:      General: She is awake. She is not in acute distress.    Appearance: She is well-developed and well-groomed. She is not ill-appearing or toxic-appearing.  HENT:     Head: Normocephalic and atraumatic.     Right Ear: Hearing, tympanic membrane, ear canal and external ear normal. No drainage.     Left Ear: Hearing, tympanic membrane, ear canal and external ear normal. No drainage.     Nose: Nose normal.     Right Sinus: No maxillary sinus tenderness or frontal sinus tenderness.     Left Sinus: No maxillary sinus tenderness or frontal sinus tenderness.     Mouth/Throat:     Mouth: Mucous membranes are moist.     Pharynx: Oropharynx is clear. Uvula midline. No pharyngeal swelling, oropharyngeal exudate or posterior oropharyngeal erythema.  Eyes:     General: Lids are normal.        Right eye: No discharge.        Left eye: No discharge.     Extraocular Movements: Extraocular movements intact.     Conjunctiva/sclera: Conjunctivae normal.     Pupils: Pupils are equal, round, and reactive to light.     Visual Fields: Right eye visual fields normal and left eye visual fields normal.  Neck:     Thyroid: No thyromegaly.     Vascular: No carotid bruit.     Trachea: Trachea normal.  Cardiovascular:     Rate and Rhythm: Normal rate and regular rhythm.     Heart sounds: Normal heart sounds. No murmur heard.    No gallop.  Pulmonary:     Effort: Pulmonary effort is normal. No accessory muscle  usage or respiratory distress.     Breath sounds: Normal breath sounds.  Abdominal:     General: Bowel sounds are normal.     Palpations: Abdomen is soft. There is no hepatomegaly or splenomegaly.     Tenderness: There is no abdominal tenderness.  Musculoskeletal:        General: Normal range of motion.     Cervical back: Normal range of motion and neck supple.     Right lower leg: No edema.     Left lower leg: No edema.  Lymphadenopathy:     Head:     Right side of head: No submental, submandibular, tonsillar, preauricular or posterior auricular adenopathy.     Left side of  head: No submental, submandibular, tonsillar, preauricular or posterior auricular adenopathy.     Cervical: No cervical adenopathy.  Skin:    General: Skin is warm and dry.     Capillary Refill: Capillary refill takes less than 2 seconds.     Findings: No rash.  Neurological:     Mental Status: She is alert and oriented to person, place, and time.     Gait: Gait is intact.     Deep Tendon Reflexes: Reflexes are normal and symmetric.     Reflex Scores:      Brachioradialis reflexes are 2+ on the right side and 2+ on the left side.      Patellar reflexes are 2+ on the right side and 2+ on the left side. Psychiatric:        Attention and Perception: Attention normal.        Mood and Affect: Mood normal.        Speech: Speech normal.        Behavior: Behavior normal. Behavior is cooperative.        Thought Content: Thought content normal.        Judgment: Judgment normal.    Results for orders placed or performed in visit on 03/12/23  TSH  Result Value Ref Range   TSH 0.876 0.450 - 4.500 uIU/mL  T4, free  Result Value Ref Range   Free T4 1.50 0.82 - 1.77 ng/dL      Assessment & Plan:   Problem List Items Addressed This Visit       Cardiovascular and Mediastinum   Migraines    Chronic, stable.  Continue collaboration with neurology and current regimen as ordered by them.  Recent note reviewed.         Endocrine   Hashimoto's thyroiditis    Chronic, ongoing.  Will recheck today to ensure ongoing stable levels and continue current dosing, unless labs show need to change.       Relevant Orders   CBC with Differential/Platelet   Comprehensive metabolic panel   TSH   T4, free   Magnesium     Nervous and Auditory   Eye twitch    Ongoing intermittent issue since 01/29/23.  She has cut back on caffeine and energy drinks with continued twitching to right eye.  Check ANA, CRP, ESR, Magnesium, Lyme testing, and CBC/CMP today.  Recommend she visit with eye doctor and wear glasses prescribed.  If ongoing will consider imaging and referral to neurology.      Relevant Orders   ANA 12 Plus Profile (RDL)   Lyme disease, western blot   C-reactive protein   Sed Rate (ESR)     Other   Depression, major, single episode, moderate (HCC) - Primary    Chronic, ongoing.  Denies SI/HI.  At this time continue therapy as needed and consider starting medication in future as needed -- would be cautious with Wellbutrin if used due to her anxiety level.  Discussed SSRIs.  Check thyroid labs.      Generalized anxiety disorder    Refer to depression plan of care.  Level on GAD increased at present due to situational stressors.      Relevant Orders   Comprehensive metabolic panel   Vitamin D deficiency    Ongoing, is taking supplement.  Continue this and check Vit D level today.      Relevant Orders   VITAMIN D 25 Hydroxy (Vit-D Deficiency, Fractures)   Other Visit Diagnoses  Encounter for lipid screening for cardiovascular disease       Lipid panel today.   Relevant Orders   Comprehensive metabolic panel   Lipid Panel w/o Chol/HDL Ratio   Encounter for annual physical exam       Annual physical today with labs and health maintenance reviewed, discussed with patient.        Follow up plan: Return in about 6 weeks (around 05/29/2023) for Eye Twitching.   LABORATORY TESTING:  - Pap  smear: up to date  IMMUNIZATIONS:   - Tdap: Tetanus vaccination status reviewed: last tetanus booster within 10 years. - Influenza: Up to date - Pneumovax: Not applicable - Prevnar: Not applicable - COVID: Up to date - HPV: Not applicable - Shingrix vaccine: Not applicable  SCREENING: -Mammogram: Not applicable  - Colonoscopy: Not applicable  - Bone Density: Not applicable  -Hearing Test: Not applicable  -Spirometry: Not applicable   PATIENT COUNSELING:   Advised to take 1 mg of folate supplement per day if capable of pregnancy.   Sexuality: Discussed sexually transmitted diseases, partner selection, use of condoms, avoidance of unintended pregnancy  and contraceptive alternatives.   Advised to avoid cigarette smoking.  I discussed with the patient that most people either abstain from alcohol or drink within safe limits (<=14/week and <=4 drinks/occasion for males, <=7/weeks and <= 3 drinks/occasion for females) and that the risk for alcohol disorders and other health effects rises proportionally with the number of drinks per week and how often a drinker exceeds daily limits.  Discussed cessation/primary prevention of drug use and availability of treatment for abuse.   Diet: Encouraged to adjust caloric intake to maintain  or achieve ideal body weight, to reduce intake of dietary saturated fat and total fat, to limit sodium intake by avoiding high sodium foods and not adding table salt, and to maintain adequate dietary potassium and calcium preferably from fresh fruits, vegetables, and low-fat dairy products.    stressed the importance of regular exercise  Injury prevention: Discussed safety belts, safety helmets, smoke detector, smoking near bedding or upholstery.   Dental health: Discussed importance of regular tooth brushing, flossing, and dental visits.    NEXT PREVENTATIVE PHYSICAL DUE IN 1 YEAR. Return in about 6 weeks (around 05/29/2023) for Eye  Twitching.

## 2023-04-19 NOTE — Progress Notes (Signed)
Contacted via MyChart   Good morning Christy Whitehead, your labs are starting to return: - Kidney function, creatinine and eGFR, remains normal, as is liver function, AST and ALT.  - CBC shows no anemia or infection. - Thyroid levels stable, continue current dosing. - CRP and ESR, inflammatory markers, are normal. - Waiting on ANA and Lyme testing.  If all normal and eye continues to have frequent twitching I may send you to neurology and do recommend you see eye doctor as well.  Any questions? Keep being amazing!!  Thank you for allowing me to participate in your care.  I appreciate you. Kindest regards, Chioke Noxon

## 2023-04-21 NOTE — Progress Notes (Signed)
Lyme testing is negative

## 2023-04-28 NOTE — Progress Notes (Signed)
Contacted via MyChart   ANA is negative, good news.  If ongoing issues with eye we will get you into neurology.

## 2023-04-30 LAB — LYME DISEASE, WESTERN BLOT
IgG P18 Ab.: ABSENT
IgG P23 Ab.: ABSENT
IgG P28 Ab.: ABSENT
IgG P30 Ab.: ABSENT
IgG P39 Ab.: ABSENT
IgG P45 Ab.: ABSENT
IgG P58 Ab.: ABSENT
IgG P66 Ab.: ABSENT
IgG P93 Ab.: ABSENT
IgM P23 Ab.: ABSENT
IgM P39 Ab.: ABSENT
IgM P41 Ab.: ABSENT
Lyme IgG Wb: NEGATIVE
Lyme IgM Wb: NEGATIVE

## 2023-04-30 LAB — LIPID PANEL W/O CHOL/HDL RATIO
Cholesterol, Total: 191 mg/dL (ref 100–199)
HDL: 70 mg/dL (ref >39)
LDL Chol Calc (NIH): 93 mg/dL (ref 0–99)
Triglycerides: 166 mg/dL — ABNORMAL HIGH (ref 0–149)
VLDL Cholesterol Cal: 28 mg/dL (ref 5–40)

## 2023-04-30 LAB — CBC WITH DIFFERENTIAL/PLATELET
Basophils Absolute: 0.1 10*3/uL (ref 0.0–0.2)
Basos: 1 %
EOS (ABSOLUTE): 0.3 10*3/uL (ref 0.0–0.4)
Eos: 3 %
Hematocrit: 42.2 % (ref 34.0–46.6)
Hemoglobin: 13.3 g/dL (ref 11.1–15.9)
Immature Grans (Abs): 0 10*3/uL (ref 0.0–0.1)
Immature Granulocytes: 0 %
Lymphocytes Absolute: 2.9 10*3/uL (ref 0.7–3.1)
Lymphs: 30 %
MCH: 29.1 pg (ref 26.6–33.0)
MCHC: 31.5 g/dL (ref 31.5–35.7)
MCV: 92 fL (ref 79–97)
Monocytes Absolute: 0.7 10*3/uL (ref 0.1–0.9)
Monocytes: 7 %
Neutrophils Absolute: 5.7 10*3/uL (ref 1.4–7.0)
Neutrophils: 59 %
Platelets: 358 10*3/uL (ref 150–450)
RBC: 4.57 x10E6/uL (ref 3.77–5.28)
RDW: 11.4 % — ABNORMAL LOW (ref 11.7–15.4)
WBC: 9.7 10*3/uL (ref 3.4–10.8)

## 2023-04-30 LAB — COMPREHENSIVE METABOLIC PANEL
ALT: 8 IU/L (ref 0–32)
AST: 11 IU/L (ref 0–40)
Albumin: 4 g/dL (ref 3.9–4.9)
Alkaline Phosphatase: 64 IU/L (ref 44–121)
BUN/Creatinine Ratio: 20 (ref 9–23)
BUN: 16 mg/dL (ref 6–20)
Bilirubin Total: 0.2 mg/dL (ref 0.0–1.2)
CO2: 22 mmol/L (ref 20–29)
Calcium: 9.6 mg/dL (ref 8.7–10.2)
Chloride: 105 mmol/L (ref 96–106)
Creatinine, Ser: 0.82 mg/dL (ref 0.57–1.00)
Globulin, Total: 2.5 g/dL (ref 1.5–4.5)
Glucose: 75 mg/dL (ref 70–99)
Potassium: 3.8 mmol/L (ref 3.5–5.2)
Sodium: 139 mmol/L (ref 134–144)
Total Protein: 6.5 g/dL (ref 6.0–8.5)
eGFR: 95 mL/min/{1.73_m2} (ref >59)

## 2023-04-30 LAB — ANTI-RO/NEG ANA

## 2023-04-30 LAB — VITAMIN D 25 HYDROXY (VIT D DEFICIENCY, FRACTURES): Vit D, 25-Hydroxy: 74.8 ng/mL (ref 30.0–100.0)

## 2023-04-30 LAB — T4, FREE: Free T4: 1.4 ng/dL (ref 0.82–1.77)

## 2023-04-30 LAB — TSH: TSH: 1.61 u[IU]/mL (ref 0.450–4.500)

## 2023-04-30 LAB — MAGNESIUM: Magnesium: 2 mg/dL (ref 1.6–2.3)

## 2023-04-30 LAB — C-REACTIVE PROTEIN: CRP: 7 mg/L (ref 0–10)

## 2023-04-30 LAB — ANA 12 PLUS PROFILE (RDL): Anti-Nuclear Ab by IFA (RDL): NEGATIVE

## 2023-04-30 LAB — SEDIMENTATION RATE: Sed Rate: 2 mm/h (ref 0–32)

## 2023-05-18 ENCOUNTER — Other Ambulatory Visit: Payer: Self-pay

## 2023-05-18 ENCOUNTER — Other Ambulatory Visit: Payer: Self-pay | Admitting: Family Medicine

## 2023-05-21 ENCOUNTER — Inpatient Hospital Stay (HOSPITAL_COMMUNITY)
Admission: EM | Admit: 2023-05-21 | Discharge: 2023-05-26 | DRG: 872 | Disposition: A | Discharge status: Home / Self Care | Payer: 59 | Attending: Family Medicine | Admitting: Family Medicine

## 2023-05-21 ENCOUNTER — Encounter (HOSPITAL_COMMUNITY): Payer: Self-pay

## 2023-05-21 ENCOUNTER — Emergency Department (HOSPITAL_COMMUNITY): Payer: 59

## 2023-05-21 ENCOUNTER — Ambulatory Visit
Admission: EM | Admit: 2023-05-21 | Discharge: 2023-05-21 | Payer: 59 | Attending: Physician Assistant | Admitting: Physician Assistant

## 2023-05-21 ENCOUNTER — Encounter: Payer: Self-pay | Admitting: Emergency Medicine

## 2023-05-21 ENCOUNTER — Other Ambulatory Visit: Payer: Self-pay

## 2023-05-21 DIAGNOSIS — K760 Fatty (change of) liver, not elsewhere classified: Secondary | ICD-10-CM | POA: Diagnosis not present

## 2023-05-21 DIAGNOSIS — A419 Sepsis, unspecified organism: Secondary | ICD-10-CM | POA: Diagnosis present

## 2023-05-21 DIAGNOSIS — E860 Dehydration: Secondary | ICD-10-CM | POA: Insufficient documentation

## 2023-05-21 DIAGNOSIS — E663 Overweight: Secondary | ICD-10-CM | POA: Diagnosis present

## 2023-05-21 DIAGNOSIS — N10 Acute tubulo-interstitial nephritis: Secondary | ICD-10-CM | POA: Insufficient documentation

## 2023-05-21 DIAGNOSIS — G43909 Migraine, unspecified, not intractable, without status migrainosus: Secondary | ICD-10-CM | POA: Diagnosis present

## 2023-05-21 DIAGNOSIS — A4151 Sepsis due to Escherichia coli [E. coli]: Secondary | ICD-10-CM | POA: Diagnosis not present

## 2023-05-21 DIAGNOSIS — Z6825 Body mass index (BMI) 25.0-25.9, adult: Secondary | ICD-10-CM

## 2023-05-21 DIAGNOSIS — R6520 Severe sepsis without septic shock: Secondary | ICD-10-CM | POA: Diagnosis not present

## 2023-05-21 DIAGNOSIS — R42 Dizziness and giddiness: Secondary | ICD-10-CM | POA: Insufficient documentation

## 2023-05-21 DIAGNOSIS — N1330 Unspecified hydronephrosis: Secondary | ICD-10-CM | POA: Diagnosis not present

## 2023-05-21 DIAGNOSIS — Z818 Family history of other mental and behavioral disorders: Secondary | ICD-10-CM

## 2023-05-21 DIAGNOSIS — Z7985 Long-term (current) use of injectable non-insulin antidiabetic drugs: Secondary | ICD-10-CM | POA: Diagnosis not present

## 2023-05-21 DIAGNOSIS — Z8669 Personal history of other diseases of the nervous system and sense organs: Secondary | ICD-10-CM

## 2023-05-21 DIAGNOSIS — Z833 Family history of diabetes mellitus: Secondary | ICD-10-CM | POA: Diagnosis not present

## 2023-05-21 DIAGNOSIS — Z803 Family history of malignant neoplasm of breast: Secondary | ICD-10-CM

## 2023-05-21 DIAGNOSIS — Z1152 Encounter for screening for COVID-19: Secondary | ICD-10-CM | POA: Diagnosis not present

## 2023-05-21 DIAGNOSIS — Z825 Family history of asthma and other chronic lower respiratory diseases: Secondary | ICD-10-CM

## 2023-05-21 DIAGNOSIS — N179 Acute kidney failure, unspecified: Secondary | ICD-10-CM

## 2023-05-21 DIAGNOSIS — E039 Hypothyroidism, unspecified: Secondary | ICD-10-CM | POA: Diagnosis present

## 2023-05-21 DIAGNOSIS — R000 Tachycardia, unspecified: Secondary | ICD-10-CM | POA: Diagnosis not present

## 2023-05-21 DIAGNOSIS — E063 Autoimmune thyroiditis: Secondary | ICD-10-CM | POA: Diagnosis present

## 2023-05-21 DIAGNOSIS — Z79890 Hormone replacement therapy (postmenopausal): Secondary | ICD-10-CM

## 2023-05-21 DIAGNOSIS — Z810 Family history of intellectual disabilities: Secondary | ICD-10-CM

## 2023-05-21 DIAGNOSIS — N12 Tubulo-interstitial nephritis, not specified as acute or chronic: Principal | ICD-10-CM | POA: Diagnosis present

## 2023-05-21 DIAGNOSIS — Z87891 Personal history of nicotine dependence: Secondary | ICD-10-CM | POA: Diagnosis not present

## 2023-05-21 DIAGNOSIS — Z86001 Personal history of in-situ neoplasm of cervix uteri: Secondary | ICD-10-CM | POA: Diagnosis not present

## 2023-05-21 DIAGNOSIS — Z8249 Family history of ischemic heart disease and other diseases of the circulatory system: Secondary | ICD-10-CM | POA: Diagnosis not present

## 2023-05-21 DIAGNOSIS — E872 Acidosis, unspecified: Secondary | ICD-10-CM | POA: Diagnosis present

## 2023-05-21 DIAGNOSIS — Z79899 Other long term (current) drug therapy: Secondary | ICD-10-CM | POA: Diagnosis not present

## 2023-05-21 DIAGNOSIS — D649 Anemia, unspecified: Secondary | ICD-10-CM | POA: Diagnosis not present

## 2023-05-21 DIAGNOSIS — R161 Splenomegaly, not elsewhere classified: Secondary | ICD-10-CM | POA: Diagnosis present

## 2023-05-21 DIAGNOSIS — E876 Hypokalemia: Secondary | ICD-10-CM | POA: Diagnosis present

## 2023-05-21 DIAGNOSIS — Z0389 Encounter for observation for other suspected diseases and conditions ruled out: Secondary | ICD-10-CM | POA: Diagnosis not present

## 2023-05-21 DIAGNOSIS — Z888 Allergy status to other drugs, medicaments and biological substances status: Secondary | ICD-10-CM

## 2023-05-21 DIAGNOSIS — R162 Hepatomegaly with splenomegaly, not elsewhere classified: Secondary | ICD-10-CM | POA: Diagnosis not present

## 2023-05-21 DIAGNOSIS — R109 Unspecified abdominal pain: Secondary | ICD-10-CM | POA: Diagnosis not present

## 2023-05-21 HISTORY — DX: Sepsis, unspecified organism: A41.9

## 2023-05-21 HISTORY — DX: Acute kidney failure, unspecified: N17.9

## 2023-05-21 HISTORY — DX: Tubulo-interstitial nephritis, not specified as acute or chronic: N12

## 2023-05-21 LAB — APTT: aPTT: 31 s (ref 24–36)

## 2023-05-21 LAB — URINALYSIS, W/ REFLEX TO CULTURE (INFECTION SUSPECTED)
Bilirubin Urine: NEGATIVE
Glucose, UA: NEGATIVE mg/dL
Glucose, UA: NEGATIVE mg/dL
Ketones, ur: NEGATIVE mg/dL
Nitrite: NEGATIVE
Nitrite: NEGATIVE
Protein, ur: >300 mg/dL — AB
Protein, ur: 100 mg/dL — AB
Specific Gravity, Urine: 1.02 (ref 1.005–1.030)
Specific Gravity, Urine: 1.018 (ref 1.005–1.030)
WBC, UA: >50 WBC/hpf (ref 0–5)
pH: 5.5 (ref 5.0–8.0)
pH: 5 (ref 5.0–8.0)

## 2023-05-21 LAB — CBC WITH DIFFERENTIAL/PLATELET
Abs Immature Granulocytes: 0.7 10*3/uL — ABNORMAL HIGH (ref 0.00–0.07)
Abs Immature Granulocytes: 3.3 10*3/uL — ABNORMAL HIGH (ref 0.00–0.07)
Band Neutrophils: 16 %
Basophils Absolute: 0 10*3/uL (ref 0.0–0.1)
Basophils Absolute: 0.1 10*3/uL (ref 0.0–0.1)
Basophils Relative: 0 %
Basophils Relative: 0 %
Eosinophils Absolute: 0 10*3/uL (ref 0.0–0.5)
Eosinophils Absolute: 0 10*3/uL (ref 0.0–0.5)
Eosinophils Relative: 0 %
Eosinophils Relative: 0 %
HCT: 33.9 % — ABNORMAL LOW (ref 36.0–46.0)
HCT: 35.1 % — ABNORMAL LOW (ref 36.0–46.0)
Hemoglobin: 11.4 g/dL — ABNORMAL LOW (ref 12.0–15.0)
Hemoglobin: 11.4 g/dL — ABNORMAL LOW (ref 12.0–15.0)
Immature Granulocytes: 10 %
Lymphocytes Relative: 2 %
Lymphocytes Relative: 2 %
Lymphs Abs: 0.7 10*3/uL (ref 0.7–4.0)
Lymphs Abs: 0.5 10*3/uL — ABNORMAL LOW (ref 0.7–4.0)
MCH: 29.1 pg (ref 26.0–34.0)
MCH: 28.8 pg (ref 26.0–34.0)
MCHC: 33.6 g/dL (ref 30.0–36.0)
MCHC: 32.5 g/dL (ref 30.0–36.0)
MCV: 86.5 fL (ref 80.0–100.0)
MCV: 88.6 fL (ref 80.0–100.0)
Metamyelocytes Relative: 2 %
Monocytes Absolute: 1.3 10*3/uL — ABNORMAL HIGH (ref 0.1–1.0)
Monocytes Absolute: 1.5 10*3/uL — ABNORMAL HIGH (ref 0.1–1.0)
Monocytes Relative: 4 %
Monocytes Relative: 4 %
Neutro Abs: 30.3 10*3/uL — ABNORMAL HIGH (ref 1.7–7.7)
Neutro Abs: 28.1 10*3/uL — ABNORMAL HIGH (ref 1.7–7.7)
Neutrophils Relative %: 76 %
Neutrophils Relative %: 84 %
Platelets: 289 10*3/uL (ref 150–400)
Platelets: 290 10*3/uL (ref 150–400)
RBC: 3.92 MIL/uL (ref 3.87–5.11)
RBC: 3.96 MIL/uL (ref 3.87–5.11)
RDW: 13.1 % (ref 11.5–15.5)
RDW: 13 % (ref 11.5–15.5)
WBC: 32.9 10*3/uL — ABNORMAL HIGH (ref 4.0–10.5)
WBC: 33.5 10*3/uL — ABNORMAL HIGH (ref 4.0–10.5)
nRBC: 0 % (ref 0.0–0.2)
nRBC: 0 % (ref 0.0–0.2)

## 2023-05-21 LAB — BASIC METABOLIC PANEL
Anion gap: 11 (ref 5–15)
BUN: 34 mg/dL — ABNORMAL HIGH (ref 6–20)
CO2: 15 mmol/L — ABNORMAL LOW (ref 22–32)
Calcium: 7.1 mg/dL — ABNORMAL LOW (ref 8.9–10.3)
Chloride: 106 mmol/L (ref 98–111)
Creatinine, Ser: 3.58 mg/dL — ABNORMAL HIGH (ref 0.44–1.00)
GFR, Estimated: 16 mL/min — ABNORMAL LOW (ref >60)
Glucose, Bld: 106 mg/dL — ABNORMAL HIGH (ref 70–99)
Potassium: 3.3 mmol/L — ABNORMAL LOW (ref 3.5–5.1)
Sodium: 132 mmol/L — ABNORMAL LOW (ref 135–145)

## 2023-05-21 LAB — COMPREHENSIVE METABOLIC PANEL
ALT: 22 U/L (ref 0–44)
ALT: 21 U/L (ref 0–44)
AST: 24 U/L (ref 15–41)
AST: 24 U/L (ref 15–41)
Albumin: 2.6 g/dL — ABNORMAL LOW (ref 3.5–5.0)
Albumin: 2.3 g/dL — ABNORMAL LOW (ref 3.5–5.0)
Alkaline Phosphatase: 104 U/L (ref 38–126)
Alkaline Phosphatase: 126 U/L (ref 38–126)
Anion gap: 15 (ref 5–15)
Anion gap: 16 — ABNORMAL HIGH (ref 5–15)
BUN: 41 mg/dL — ABNORMAL HIGH (ref 6–20)
BUN: 37 mg/dL — ABNORMAL HIGH (ref 6–20)
CO2: 14 mmol/L — ABNORMAL LOW (ref 22–32)
CO2: 13 mmol/L — ABNORMAL LOW (ref 22–32)
Calcium: 7.6 mg/dL — ABNORMAL LOW (ref 8.9–10.3)
Calcium: 7.7 mg/dL — ABNORMAL LOW (ref 8.9–10.3)
Chloride: 102 mmol/L (ref 98–111)
Chloride: 104 mmol/L (ref 98–111)
Creatinine, Ser: 4.34 mg/dL — ABNORMAL HIGH (ref 0.44–1.00)
Creatinine, Ser: 4.53 mg/dL — ABNORMAL HIGH (ref 0.44–1.00)
GFR, Estimated: 13 mL/min — ABNORMAL LOW (ref >60)
GFR, Estimated: 12 mL/min — ABNORMAL LOW (ref >60)
Glucose, Bld: 89 mg/dL (ref 70–99)
Glucose, Bld: 90 mg/dL (ref 70–99)
Potassium: 3.6 mmol/L (ref 3.5–5.1)
Potassium: 3.6 mmol/L (ref 3.5–5.1)
Sodium: 131 mmol/L — ABNORMAL LOW (ref 135–145)
Sodium: 133 mmol/L — ABNORMAL LOW (ref 135–145)
Total Bilirubin: 0.5 mg/dL (ref <1.2)
Total Bilirubin: 0.7 mg/dL (ref <1.2)
Total Protein: 6.4 g/dL — ABNORMAL LOW (ref 6.5–8.1)
Total Protein: 5.9 g/dL — ABNORMAL LOW (ref 6.5–8.1)

## 2023-05-21 LAB — HIV ANTIBODY (ROUTINE TESTING W REFLEX): HIV Screen 4th Generation wRfx: NONREACTIVE

## 2023-05-21 LAB — LIPASE, BLOOD
Lipase: 18 U/L (ref 11–51)
Lipase: 18 U/L (ref 11–51)

## 2023-05-21 LAB — PROTIME-INR
INR: 1.3 — ABNORMAL HIGH (ref 0.8–1.2)
Prothrombin Time: 16.2 s — ABNORMAL HIGH (ref 11.4–15.2)

## 2023-05-21 LAB — HCG, SERUM, QUALITATIVE: Preg, Serum: NEGATIVE

## 2023-05-21 LAB — RESP PANEL BY RT-PCR (RSV, FLU A&B, COVID)  RVPGX2
Influenza A by PCR: NEGATIVE
Influenza B by PCR: NEGATIVE
Resp Syncytial Virus by PCR: NEGATIVE
SARS Coronavirus 2 by RT PCR: NEGATIVE

## 2023-05-21 LAB — SALICYLATE LEVEL: Salicylate Lvl: <7 mg/dL — ABNORMAL LOW (ref 7.0–30.0)

## 2023-05-21 LAB — TSH: TSH: 4.067 u[IU]/mL (ref 0.350–4.500)

## 2023-05-21 LAB — I-STAT CG4 LACTIC ACID, ED: Lactic Acid, Venous: 1.6 mmol/L (ref 0.5–1.9)

## 2023-05-21 MED ORDER — LEVOTHYROXINE SODIUM 25 MCG PO TABS
125.0000 ug | ORAL_TABLET | Freq: Every day | ORAL | Status: DC
  Administered 2023-05-22 – 2023-05-26 (×5): 125 ug via ORAL
  Filled 2023-05-21 (×5): qty 1

## 2023-05-21 MED ORDER — ONDANSETRON HCL 4 MG PO TABS
4.0000 mg | ORAL_TABLET | Freq: Four times a day (QID) | ORAL | Status: DC | PRN
Start: 2023-05-21 — End: 2023-05-26

## 2023-05-21 MED ORDER — LACTATED RINGERS IV BOLUS
1000.0000 mL | Freq: Once | INTRAVENOUS | Status: AC
  Administered 2023-05-21: 1000 mL via INTRAVENOUS

## 2023-05-21 MED ORDER — HEPARIN SODIUM (PORCINE) 5000 UNIT/ML IJ SOLN
5000.0000 [IU] | Freq: Three times a day (TID) | INTRAMUSCULAR | Status: DC
  Filled 2023-05-21 (×5): qty 1

## 2023-05-21 MED ORDER — ATOGEPANT 60 MG PO TABS
60.0000 mg | ORAL_TABLET | Freq: Every day | ORAL | Status: DC
  Administered 2023-05-23 – 2023-05-25 (×3): 60 mg via ORAL
  Filled 2023-05-21 (×6): qty 1

## 2023-05-21 MED ORDER — MORPHINE SULFATE (PF) 2 MG/ML IV SOLN
2.0000 mg | INTRAVENOUS | Status: DC | PRN
  Administered 2023-05-21 – 2023-05-22 (×5): 2 mg via INTRAVENOUS
  Filled 2023-05-21 (×5): qty 1

## 2023-05-21 MED ORDER — LACTATED RINGERS IV SOLN: INTRAVENOUS | Status: DC

## 2023-05-21 MED ORDER — VANCOMYCIN HCL 1250 MG/250ML IV SOLN
1250.0000 mg | Freq: Once | INTRAVENOUS | Status: AC
  Administered 2023-05-21: 1250 mg via INTRAVENOUS
  Filled 2023-05-21: qty 250

## 2023-05-21 MED ORDER — LACTATED RINGERS IV BOLUS (SEPSIS)
1000.0000 mL | Freq: Once | INTRAVENOUS | Status: AC
  Administered 2023-05-21: 1000 mL via INTRAVENOUS

## 2023-05-21 MED ORDER — SUMATRIPTAN SUCCINATE 100 MG PO TABS: 100.0000 mg | ORAL_TABLET | Freq: Once | ORAL | Status: DC | PRN

## 2023-05-21 MED ORDER — SODIUM CHLORIDE 0.9% FLUSH
3.0000 mL | Freq: Two times a day (BID) | INTRAVENOUS | Status: DC
  Administered 2023-05-21 – 2023-05-25 (×7): 3 mL via INTRAVENOUS

## 2023-05-21 MED ORDER — HYDROCODONE-ACETAMINOPHEN 5-325 MG PO TABS
1.0000 | ORAL_TABLET | Freq: Four times a day (QID) | ORAL | Status: DC | PRN
  Administered 2023-05-21 – 2023-05-23 (×3): 1 via ORAL
  Filled 2023-05-21 (×4): qty 1

## 2023-05-21 MED ORDER — STERILE WATER FOR INJECTION IV SOLN
INTRAVENOUS | Status: AC
  Filled 2023-05-21: qty 150

## 2023-05-21 MED ORDER — METRONIDAZOLE 500 MG/100ML IV SOLN
500.0000 mg | Freq: Once | INTRAVENOUS | Status: AC
Start: 2023-05-21 — End: 2023-05-21
  Administered 2023-05-21: 500 mg via INTRAVENOUS
  Filled 2023-05-21: qty 100

## 2023-05-21 MED ORDER — TOPIRAMATE 25 MG PO TABS
100.0000 mg | ORAL_TABLET | Freq: Two times a day (BID) | ORAL | Status: DC
  Administered 2023-05-21 – 2023-05-26 (×10): 100 mg via ORAL
  Filled 2023-05-21 (×10): qty 4

## 2023-05-21 MED ORDER — SODIUM CHLORIDE 0.9 % IV SOLN
2.0000 g | Freq: Once | INTRAVENOUS | Status: AC
Start: 2023-05-21 — End: 2023-05-21
  Administered 2023-05-21: 2 g via INTRAVENOUS
  Filled 2023-05-21: qty 12.5

## 2023-05-21 MED ORDER — ALBUTEROL SULFATE (2.5 MG/3ML) 0.083% IN NEBU: 2.5000 mg | INHALATION_SOLUTION | Freq: Four times a day (QID) | RESPIRATORY_TRACT | Status: DC | PRN

## 2023-05-21 MED ORDER — VANCOMYCIN HCL IN DEXTROSE 1-5 GM/200ML-% IV SOLN: 1000.0000 mg | Freq: Once | INTRAVENOUS | Status: DC

## 2023-05-21 MED ORDER — LACTATED RINGERS IV BOLUS (SEPSIS)
250.0000 mL | Freq: Once | INTRAVENOUS | Status: AC
Start: 2023-05-21 — End: 2023-05-21
  Administered 2023-05-21: 250 mL via INTRAVENOUS

## 2023-05-21 MED ORDER — PROCHLORPERAZINE EDISYLATE 10 MG/2ML IJ SOLN
10.0000 mg | INTRAMUSCULAR | Status: AC
Start: 2023-05-21 — End: 2023-05-21
  Administered 2023-05-21: 10 mg via INTRAVENOUS
  Filled 2023-05-21: qty 2

## 2023-05-21 MED ORDER — FLUTICASONE PROPIONATE 50 MCG/ACT NA SUSP
1.0000 | Freq: Every day | NASAL | Status: DC
  Administered 2023-05-21: 2 via NASAL
  Administered 2023-05-22: 1 via NASAL
  Administered 2023-05-23: 2 via NASAL
  Administered 2023-05-24: 1 via NASAL
  Administered 2023-05-25: 2 via NASAL
  Filled 2023-05-21: qty 16

## 2023-05-21 MED ORDER — OXYMETAZOLINE HCL 0.05 % NA SOLN: 1.0000 | Freq: Two times a day (BID) | NASAL | Status: AC | PRN

## 2023-05-21 MED ORDER — SODIUM CHLORIDE 0.9 % IV SOLN
2.0000 g | INTRAVENOUS | Status: DC
  Administered 2023-05-22 – 2023-05-24 (×3): 2 g via INTRAVENOUS
  Filled 2023-05-21 (×3): qty 20

## 2023-05-21 MED ORDER — ACETAMINOPHEN 325 MG PO TABS
650.0000 mg | ORAL_TABLET | Freq: Four times a day (QID) | ORAL | Status: DC | PRN
  Administered 2023-05-22 – 2023-05-26 (×11): 650 mg via ORAL
  Filled 2023-05-21 (×13): qty 2

## 2023-05-21 MED ORDER — MORPHINE SULFATE (PF) 2 MG/ML IV SOLN
2.0000 mg | Freq: Once | INTRAVENOUS | Status: AC
  Administered 2023-05-21: 2 mg via INTRAVENOUS
  Filled 2023-05-21: qty 1

## 2023-05-21 MED ORDER — POTASSIUM CHLORIDE CRYS ER 20 MEQ PO TBCR
30.0000 meq | EXTENDED_RELEASE_TABLET | ORAL | Status: AC
  Administered 2023-05-21: 30 meq via ORAL
  Filled 2023-05-21: qty 1

## 2023-05-21 MED ORDER — ONDANSETRON HCL 4 MG/2ML IJ SOLN
4.0000 mg | Freq: Four times a day (QID) | INTRAMUSCULAR | Status: DC | PRN
  Administered 2023-05-22 – 2023-05-25 (×2): 4 mg via INTRAVENOUS
  Filled 2023-05-21 (×2): qty 2

## 2023-05-21 MED ORDER — ONDANSETRON HCL 4 MG/2ML IJ SOLN
4.0000 mg | Freq: Once | INTRAMUSCULAR | Status: AC
  Administered 2023-05-21: 4 mg via INTRAVENOUS
  Filled 2023-05-21: qty 2

## 2023-05-21 MED ORDER — ACETAMINOPHEN 650 MG RE SUPP: 650.0000 mg | Freq: Four times a day (QID) | RECTAL | Status: DC | PRN

## 2023-05-21 NOTE — ED Triage Notes (Signed)
Pt c/o migraine, dizziness, nausea, abdominal pain and fever x 5-6 days. Pt has tried her migraine medication to help with her symptoms.

## 2023-05-21 NOTE — ED Provider Notes (Signed)
Patient does not have any index of suspicion that she is pregnant.  Will bypass pregnancy labs to obtain CT   Margarita Grizzle, MD 05/21/23 [id number]

## 2023-05-21 NOTE — ED Notes (Signed)
ED TO INPATIENT HANDOFF REPORT  ED Nurse Name and Phone #: [phone]  S Name/Age/Gender Christy Whitehead 37 y.o. female Room/Bed: 016C/016C  Code Status   Code Status: Not on file  Home/SNF/Other Home Patient oriented to: self, place, time, and situation Is this baseline? Yes   Triage Complete: Triage complete  Chief Complaint Pyelonephritis [N12]  Triage Note Pt arrives POV from urgent care for abdominal pain and nausea x5 days. Pt reports she has UTI and AKI. Pt reports 9/10 sharp upper abdominal pain upon movement accompanied by nausea and diarrhea.    Allergies Allergies  Allergen Reactions   Aimovig [Erenumab-Aooe] Hives    Site reaction   Emgality [Galcanezumab-Gnlm] Hives    Site reaction    Level of Care/Admitting Diagnosis ED Disposition     ED Disposition  Admit   Condition  --   Comment  Hospital Area: [facility] [[id number]]  Level of Care: Telemetry Medical [104]  May admit patient to [facility] or [facility] if equivalent level of care is available:: No  Covid Evaluation: Asymptomatic - no recent exposure (last 10 days) testing not required  Diagnosis: Pyelonephritis [865784]  Admitting Physician: Clydie Braun [[id number]]  Attending Physician: Clydie Braun [[id number]]  Certification:: I certify this patient will need inpatient services for at least 2 midnights  Expected Medical Readiness: 05/23/2023          B Medical/Surgery History Past Medical History:  Diagnosis Date   Abnormal Pap smear of cervix 12/19/2008, 02/20/2009   ASCUS   Abnormal Pap smear of cervix 08/06/2010, 10/01/2010   HGSIL   Cervical dysplasia CIN1 06/05/2004   CIN III (cervical intraepithelial neoplasia grade III) with severe dysplasia 12/20/2010   LEEP Ecto Cervix   Dysmenorrhea    Migraines    Past Surgical History:  Procedure Laterality Date   CHOLECYSTECTOMY  2015   COLPOSCOPY  06/05/2004,02/20/2009,10/01/2010   LEEP  12/20/2010   ECC  &Endo Cx Negative, Ecto CIN 2-3     A IV Location/Drains/Wounds Patient Lines/Drains/Airways Status     Active Line/Drains/Airways     Name Placement date Placement time Site Days   Peripheral IV 05/21/23 20 G Right Antecubital 05/21/23  1242  Antecubital  less than 1   Peripheral IV 05/21/23 20 G Left Antecubital 05/21/23  1304  Antecubital  less than 1            Intake/Output Last 24 hours No intake or output data in the 24 hours ending 05/21/23 1501  Labs/Imaging Results for orders placed or performed during the hospital encounter of 05/21/23 (from the past 48 hours)  Resp panel by RT-PCR (RSV, Flu A&B, Covid) Anterior Nasal Swab     Status: None   Collection Time: 05/21/23 12:02 PM   Specimen: Anterior Nasal Swab  Result Value Ref Range   SARS Coronavirus 2 by RT PCR NEGATIVE NEGATIVE   Influenza A by PCR NEGATIVE NEGATIVE   Influenza B by PCR NEGATIVE NEGATIVE    Comment: (NOTE) The Xpert Xpress SARS-CoV-2/FLU/RSV plus assay is intended as an aid in the diagnosis of influenza from Nasopharyngeal swab specimens and should not be used as a sole basis for treatment. Nasal washings and aspirates are unacceptable for Xpert Xpress SARS-CoV-2/FLU/RSV testing.  Fact Sheet for Patients: BloggerCourse.com  Fact Sheet for Healthcare Providers: SeriousBroker.it  This test is not yet approved or cleared by the [place] FDA and has been authorized for detection and/or diagnosis of SARS-CoV-2 by FDA  under an Emergency Use Authorization (EUA). This EUA will remain in effect (meaning this test can be used) for the duration of the COVID-19 declaration under Section 564(b)(1) of the Act, 21 U.S.C. section 360bbb-3(b)(1), unless the authorization is terminated or revoked.     Resp Syncytial Virus by PCR NEGATIVE NEGATIVE    Comment: (NOTE) Fact Sheet for Patients: BloggerCourse.com  Fact Sheet  for Healthcare Providers: SeriousBroker.it  This test is not yet approved or cleared by the [place] FDA and has been authorized for detection and/or diagnosis of SARS-CoV-2 by FDA under an Emergency Use Authorization (EUA). This EUA will remain in effect (meaning this test can be used) for the duration of the COVID-19 declaration under Section 564(b)(1) of the Act, 21 U.S.C. section 360bbb-3(b)(1), unless the authorization is terminated or revoked.  Performed at [facility], [address] [place], [place] [address]   Urinalysis, w/ Reflex to Culture (Infection Suspected) -Urine, Clean Catch     Status: Abnormal   Collection Time: 05/21/23 12:03 PM  Result Value Ref Range   Specimen Source URINE, CLEAN CATCH    Color, Urine AMBER (A) YELLOW    Comment: BIOCHEMICALS MAY BE AFFECTED BY COLOR   APPearance CLOUDY (A) CLEAR   Specific Gravity, Urine 1.018 1.005 - 1.030   pH 5.0 5.0 - 8.0   Glucose, UA NEGATIVE NEGATIVE mg/dL   Hgb urine dipstick SMALL (A) NEGATIVE   Bilirubin Urine NEGATIVE NEGATIVE   Ketones, ur NEGATIVE NEGATIVE mg/dL   Protein, ur 604 (A) NEGATIVE mg/dL   Nitrite NEGATIVE NEGATIVE   Leukocytes,Ua LARGE (A) NEGATIVE   RBC / HPF 11-20 0 - 5 RBC/hpf   WBC, UA >50 0 - 5 WBC/hpf    Comment:        Reflex urine culture not performed if WBC <=10, OR if Squamous epithelial cells >5. If Squamous epithelial cells >5 suggest recollection.    Bacteria, UA FEW (A) NONE SEEN   Squamous Epithelial / HPF 6-10 0 - 5 /HPF   WBC Clumps PRESENT    Mucus PRESENT     Comment: Performed at [facility], [address] [place], [place] [address]  Comprehensive metabolic panel     Status: Abnormal   Collection Time: 05/21/23 12:43 PM  Result Value Ref Range   Sodium 133 (L) 135 - 145 mmol/L   Potassium 3.6 3.5 - 5.1 mmol/L   Chloride 104 98 - 111 mmol/L   CO2 13 (L) 22 - 32 mmol/L   Glucose, Bld 90 70 - 99 mg/dL    Comment:  Glucose reference range applies only to samples taken after fasting for at least 8 hours.   BUN 37 (H) 6 - 20 mg/dL   Creatinine, Ser 1.19 (H) 0.44 - 1.00 mg/dL   Calcium 7.7 (L) 8.9 - 10.3 mg/dL   Total Protein 5.9 (L) 6.5 - 8.1 g/dL   Albumin 2.3 (L) 3.5 - 5.0 g/dL   AST 24 15 - 41 U/L   ALT 21 0 - 44 U/L   Alkaline Phosphatase 126 38 - 126 U/L   Total Bilirubin 0.7 <1.2 mg/dL   GFR, Estimated 12 (L) >60 mL/min    Comment: (NOTE) Calculated using the CKD-EPI Creatinine Equation (2021)    Anion gap 16 (H) 5 - 15    Comment: Performed at [facility], [address], [place], [place] [address]  CBC with Differential     Status: Abnormal   Collection  Time: 05/21/23 12:43 PM  Result Value Ref Range   WBC 33.5 (H) 4.0 - 10.5 K/uL   RBC 3.96 3.87 - 5.11 MIL/uL   Hemoglobin 11.4 (L) 12.0 - 15.0 g/dL   HCT 16.1 (L) 09.6 - 04.5 %   MCV 88.6 80.0 - 100.0 fL   MCH 28.8 26.0 - 34.0 pg   MCHC 32.5 30.0 - 36.0 g/dL   RDW 40.9 81.1 - 91.4 %   Platelets 290 150 - 400 K/uL   nRBC 0.0 0.0 - 0.2 %   Neutrophils Relative % 84 %   Neutro Abs 28.1 (H) 1.7 - 7.7 K/uL   Lymphocytes Relative 2 %   Lymphs Abs 0.5 (L) 0.7 - 4.0 K/uL   Monocytes Relative 4 %   Monocytes Absolute 1.5 (H) 0.1 - 1.0 K/uL   Eosinophils Relative 0 %   Eosinophils Absolute 0.0 0.0 - 0.5 K/uL   Basophils Relative 0 %   Basophils Absolute 0.1 0.0 - 0.1 K/uL   WBC Morphology MORPHOLOGY UNREMARKABLE    RBC Morphology MORPHOLOGY UNREMARKABLE    Smear Review MORPHOLOGY UNREMARKABLE    Immature Granulocytes 10 %   Abs Immature Granulocytes 3.30 (H) 0.00 - 0.07 K/uL    Comment: Performed at [facility], [address] [place], [place] [address]  Protime-INR     Status: Abnormal   Collection Time: 05/21/23 12:43 PM  Result Value Ref Range   Prothrombin Time 16.2 (H) 11.4 - 15.2 seconds   INR 1.3 (H) 0.8 - 1.2    Comment: (NOTE) INR goal varies based on device and disease states. Performed at [facility], [address] [place], [place] [address]   APTT     Status: None   Collection Time: 05/21/23 12:43 PM  Result Value Ref Range   aPTT 31 24 - 36 seconds    Comment: Performed at [facility], [address], [place], [place] [address]  hCG, serum, qualitative     Status: None   Collection Time: 05/21/23 12:43 PM  Result Value Ref Range   Preg, Serum NEGATIVE NEGATIVE    Comment:        THE SENSITIVITY OF THIS METHODOLOGY IS >10 mIU/mL. Performed at [facility], [address], [place], [place] [address]   Lipase, blood     Status: None   Collection Time: 05/21/23 12:43 PM  Result Value Ref Range   Lipase 18 11 - 51 U/L    Comment: Performed at [facility], [address], [place], [place] [address]  I-Stat Lactic Acid, ED     Status: None   Collection Time: 05/21/23 12:50 PM  Result Value Ref Range   Lactic Acid, Venous 1.6 0.5 - 1.9 mmol/L   CT ABDOMEN PELVIS WO CONTRAST Result Date: 05/21/2023 CLINICAL DATA:  Abdominal pain. EXAM: CT ABDOMEN AND PELVIS WITHOUT CONTRAST TECHNIQUE: Multidetector CT imaging of the abdomen and pelvis was performed following the standard protocol without IV contrast. RADIATION DOSE REDUCTION: This exam was performed according to the departmental dose-optimization program which includes automated exposure control, adjustment of the mA and/or kV according to patient size and/or use of iterative reconstruction technique. COMPARISON:  None Available. FINDINGS: Lower chest: Dependent subsegmental atelectasis. No pericardial or pleural effusion. Hepatobiliary: Enlarged liver with decreased attenuation consistent with hepatic fatty infiltration. Status post cholecystectomy. Pancreas: Unremarkable. No pancreatic ductal dilatation or surrounding inflammatory changes. Spleen: Splenomegaly, 13 x 12 cm Adrenals/Urinary Tract: No  adrenal lesions. Right-sided hydronephrosis and perinephric stranding consistent with pyelonephritis without  evidence for nephrolithiasis. Unremarkable urinary bladder. Stomach/Bowel: Stomach is within normal limits. Appendix appears normal. No evidence of bowel wall thickening, distention, or inflammatory changes. Vascular/Lymphatic: No significant vascular findings are present. No enlarged abdominal or pelvic lymph nodes. Reproductive: Uterus and bilateral adnexa are unremarkable. Other: No abdominal wall hernia or abnormality. No abdominopelvic ascites. Musculoskeletal: No acute or significant osseous findings. IMPRESSION: 1. Findings consistent with right-sided pyelonephritis. 2. Hepatosplenomegaly. Fatty infiltration of the liver. Electronically Signed   By: Layla Maw M.D.   On: 05/21/2023 14:00   DG Chest Port 1 View Result Date: 05/21/2023 CLINICAL DATA:  Possible sepsis. EXAM: PORTABLE CHEST 1 VIEW COMPARISON:  None Available. FINDINGS: The heart size and mediastinal contours are within normal limits. Both lungs are clear. The visualized skeletal structures are unremarkable. IMPRESSION: No active disease. Electronically Signed   By: Lupita Raider M.D.   On: 05/21/2023 12:34    Pending Labs Unresulted Labs (From admission, onward)     Start     Ordered   05/21/23 1202  Blood Culture (routine x 2)  (Septic presentation on arrival (screening labs, nursing and treatment orders for obvious sepsis))  BLOOD CULTURE X 2,   STAT      05/21/23 1203            Vitals/Pain Today's Vitals   05/21/23 1209 05/21/23 1210 05/21/23 1300 05/21/23 1422  BP: 95/71  93/66 (!) 86/67  Pulse: (!) 108  100 93  Resp:   14 (!) 26  Temp:      TempSrc:      SpO2: 100%  100% 100%  Weight:  67 kg    Height:  5\' 4"  (1.626 m)    PainSc:    9     Isolation Precautions No active isolations  Medications Medications  lactated ringers infusion (has no administration in time range)  lactated ringers bolus 1,000 mL (has no administration in time range)  lactated ringers bolus 1,000 mL (0 mLs Intravenous  Stopped 05/21/23 1429)    And  lactated ringers bolus 1,000 mL (0 mLs Intravenous Stopped 05/21/23 1429)    And  lactated ringers bolus 250 mL (250 mLs Intravenous New Bag/Given 05/21/23 1427)  ceFEPIme (MAXIPIME) 2 g in sodium chloride 0.9 % 100 mL IVPB (0 g Intravenous Stopped 05/21/23 1429)  metroNIDAZOLE (FLAGYL) IVPB 500 mg (0 mg Intravenous Stopped 05/21/23 1433)  vancomycin (VANCOREADY) IVPB 1250 mg/250 mL (1,250 mg Intravenous New Bag/Given 05/21/23 1325)  ondansetron (ZOFRAN) injection 4 mg (4 mg Intravenous Given 05/21/23 1318)  morphine (PF) 2 MG/ML injection 2 mg (2 mg Intravenous Given 05/21/23 1425)    Mobility walks     Focused Assessments Neuro Assessment Handoff:  Swallow screen pass?          Neuro Assessment: Within Defined Limits Neuro Checks:      Has TPA been given? No If patient is a Neuro Trauma and patient is going to OR before floor call report to 4N Charge nurse: [phone] or [phone]   R Recommendations: See Admitting Provider Note  Report given to:   Additional Notes:

## 2023-05-21 NOTE — ED Notes (Signed)
1st Lactic Acid was normal.

## 2023-05-21 NOTE — ED Provider Notes (Signed)
[place] EMERGENCY DEPARTMENT AT [facility] Provider Note   CSN: [id number] Arrival date & time: 05/21/23  1148     History  Chief Complaint  Patient presents with   Abdominal Pain   Nausea    Rethel Chamika Kinsel is a 37 y.o. female.  HPI 37 year old female history of migraine headaches, status post cholecystectomy presents to the ED today with reports that she is likely septic.  Patient reports that she felt dizzy on Saturday and began running a fever Saturday night.  She may have had some burning with urination.  She has had ongoing nausea but has not had any active vomiting.  She has been taking Zofran per her headache prescription.  She had to have it refilled.  She has not had much of an appetite and has not been drinking well.  She continued to feel lightheaded and have ongoing fevers.  She reports taking acetaminophen and ibuprofen around-the-clock.  Approximately 2 days ago she began having some upper abdominal pain which was fairly sudden in onset.  It is painful with any deep breaths.  She has not had any similar pain like this in the past.  She went to urgent care today.  When she was at urgent care she was told her heart rate was 820 systolic blood pressure was in the 80s, white blood cell count was over 30 and creatinine was 4.  She was sent to the ED for further evaluation for probable sepsis. She reports last menstrual period was in October.  She uses nova ring and changes it out every 3 weeks to keep her from having a period due to it worsening her headaches.  She does not think that she is pregnant.  She denies any abnormal vaginal discharge    Home Medications Prior to Admission medications   Medication Sig Start Date End Date Taking? Authorizing Provider  acetaminophen (TYLENOL) 500 MG tablet Take 500 mg by mouth every 6 (six) hours as needed.    [provider]  Aspirin-Acetaminophen-Caffeine (EXCEDRIN PO) Take by mouth as needed.     [provider]  Atogepant (QULIPTA) 60 MG TABS Take 1 tablet (60 mg) by mouth daily. 11/04/22   Lomax, Amy, NP  Cholecalciferol 25 MCG (1000 UT) capsule Take 2,000 Units by mouth daily.    [provider]  cyanocobalamin (VITAMIN B12) 1000 MCG tablet Take 1,000 mcg by mouth daily.    [provider]  etonogestrel-ethinyl estradiol (NUVARING) 0.12-0.015 MG/24HR vaginal ring INSERT VAGINALLY AND LEAVE IN PLACE FOR 3 CONSECUTIVE WEEKS, THEN REPLACE FOR CONTINUOUS DOSING DUE TO HEADACHES 08/04/22 08/04/23  Copland, Helmut Muster B, PA-C  Insulin Pen Needle (UNIFINE PENTIPS) 31G X 5 MM MISC Use with Saxenda 07/24/22   Cannady, Corrie Dandy T, NP  levothyroxine (SYNTHROID) 125 MCG tablet Take 1 tablet (125 mcg total) by mouth daily. 01/30/23   Cannady, Corrie Dandy T, NP  nystatin cream (MYCOSTATIN) Apply 1 Application topically 2 (two) times daily. 01/29/23   Cannady, Corrie Dandy T, NP  ondansetron (ZOFRAN-ODT) 4 MG disintegrating tablet Take 1 tablet (4 mg total) by mouth every 8 (eight) hours as needed for nausea or vomiting. 11/04/22   Lomax, Amy, NP  scopolamine (TRANSDERM-SCOP) 1 MG/3DAYS Place 1 patch (1.5 mg total) onto the skin every 3 (three) days. 11/10/22   Cannady, Corrie Dandy T, NP  SUMAtriptan (IMITREX) 100 MG tablet Take 1 tablet (100 mg total) by mouth once as needed for up to 1 dose for migraine. May repeat in 2 hours  if headache persists or recurs. 11/04/22   Lomax, Amy, NP  tirzepatide (ZEPBOUND) 10 MG/0.5ML Pen Inject 10 mg into the skin once a week. 01/29/23   Cannady, Corrie Dandy T, NP  topiramate (TOPAMAX) 100 MG tablet Take 1 tablet (100 mg total) by mouth 2 (two) times daily. Please keep upcoming appt in March for continued refills. 11/04/22 11/04/23  Lomax, Amy, NP      Allergies    Aimovig [erenumab-aooe] and Emgality [galcanezumab-gnlm]    Review of Systems   Review of Systems  Physical Exam Updated Vital Signs BP (!) 86/67   Pulse 93   Temp (!) 97.5 F (36.4 C) (Oral)   Resp (!) 26    Ht 1.626 m (5\' 4" )   Wt 67 kg   SpO2 100%   BMI 25.35 kg/m  Physical Exam Vitals reviewed.  Constitutional:      General: She is not in acute distress.    Appearance: Normal appearance. She is ill-appearing.  HENT:     Right Ear: External ear normal.     Mouth/Throat:     Mouth: Mucous membranes are dry.  Eyes:     Extraocular Movements: Extraocular movements intact.     Pupils: Pupils are equal, round, and reactive to light.  Cardiovascular:     Rate and Rhythm: Regular rhythm. Tachycardia present.     Pulses: Normal pulses.  Pulmonary:     Effort: Pulmonary effort is normal.  Abdominal:     Palpations: Abdomen is soft.     Comments: Epigastric tenderness to palpation with bilateral upper quadrant tenderness and pain throughout her right lower quadrant.  Musculoskeletal:        General: Normal range of motion.     Cervical back: Normal range of motion.  Skin:    General: Skin is warm.     Capillary Refill: Capillary refill takes less than 2 seconds.  Neurological:     Mental Status: She is alert.  Psychiatric:        Mood and Affect: Mood normal.     ED Results / Procedures / Treatments   Labs (all labs ordered are listed, but only abnormal results are displayed) Labs Reviewed  COMPREHENSIVE METABOLIC PANEL - Abnormal; Notable for the following components:      Result Value   Sodium 133 (*)    CO2 13 (*)    BUN 37 (*)    Creatinine, Ser 4.53 (*)    Calcium 7.7 (*)    Total Protein 5.9 (*)    Albumin 2.3 (*)    GFR, Estimated 12 (*)    Anion gap 16 (*)    All other components within normal limits  CBC WITH DIFFERENTIAL/PLATELET - Abnormal; Notable for the following components:   WBC 33.5 (*)    Hemoglobin 11.4 (*)    HCT 35.1 (*)    Neutro Abs 28.1 (*)    Lymphs Abs 0.5 (*)    Monocytes Absolute 1.5 (*)    Abs Immature Granulocytes 3.30 (*)    All other components within normal limits  PROTIME-INR - Abnormal; Notable for the following components:    Prothrombin Time 16.2 (*)    INR 1.3 (*)    All other components within normal limits  RESP PANEL BY RT-PCR (RSV, FLU A&B, COVID)  [id number]  CULTURE, BLOOD (ROUTINE X 2)  CULTURE, BLOOD (ROUTINE X 2)  APTT  HCG, SERUM, QUALITATIVE  LIPASE, BLOOD  URINALYSIS, W/ REFLEX TO CULTURE (INFECTION SUSPECTED)  I-STAT CG4  LACTIC ACID, ED  I-STAT CG4 LACTIC ACID, ED    EKG None  Radiology CT ABDOMEN PELVIS WO CONTRAST Result Date: 05/21/2023 CLINICAL DATA:  Abdominal pain. EXAM: CT ABDOMEN AND PELVIS WITHOUT CONTRAST TECHNIQUE: Multidetector CT imaging of the abdomen and pelvis was performed following the standard protocol without IV contrast. RADIATION DOSE REDUCTION: This exam was performed according to the departmental dose-optimization program which includes automated exposure control, adjustment of the mA and/or kV according to patient size and/or use of iterative reconstruction technique. COMPARISON:  None Available. FINDINGS: Lower chest: Dependent subsegmental atelectasis. No pericardial or pleural effusion. Hepatobiliary: Enlarged liver with decreased attenuation consistent with hepatic fatty infiltration. Status post cholecystectomy. Pancreas: Unremarkable. No pancreatic ductal dilatation or surrounding inflammatory changes. Spleen: Splenomegaly, 13 x 12 cm Adrenals/Urinary Tract: No adrenal lesions. Right-sided hydronephrosis and perinephric stranding consistent with pyelonephritis without evidence for nephrolithiasis. Unremarkable urinary bladder. Stomach/Bowel: Stomach is within normal limits. Appendix appears normal. No evidence of bowel wall thickening, distention, or inflammatory changes. Vascular/Lymphatic: No significant vascular findings are present. No enlarged abdominal or pelvic lymph nodes. Reproductive: Uterus and bilateral adnexa are unremarkable. Other: No abdominal wall hernia or abnormality. No abdominopelvic ascites. Musculoskeletal: No acute or significant osseous findings.  IMPRESSION: 1. Findings consistent with right-sided pyelonephritis. 2. Hepatosplenomegaly. Fatty infiltration of the liver. Electronically Signed   By: Layla Maw M.D.   On: 05/21/2023 14:00   DG Chest Port 1 View Result Date: 05/21/2023 CLINICAL DATA:  Possible sepsis. EXAM: PORTABLE CHEST 1 VIEW COMPARISON:  None Available. FINDINGS: The heart size and mediastinal contours are within normal limits. Both lungs are clear. The visualized skeletal structures are unremarkable. IMPRESSION: No active disease. Electronically Signed   By: Lupita Raider M.D.   On: 05/21/2023 12:34    Procedures .Critical Care  Performed by: Margarita Grizzle, MD Authorized by: Margarita Grizzle, MD   Critical care provider statement:    Critical care time (minutes):  30   Critical care was necessary to treat or prevent imminent or life-threatening deterioration of the following conditions:  Sepsis   Critical care was time spent personally by me on the following activities:  Development of treatment plan with patient or surrogate, discussions with consultants, evaluation of patient's response to treatment, examination of patient, ordering and review of laboratory studies, ordering and review of radiographic studies, ordering and performing treatments and interventions, pulse oximetry, re-evaluation of patient's condition and review of old charts     Medications Ordered in ED Medications  lactated ringers infusion (has no administration in time range)  lactated ringers bolus 1,000 mL (has no administration in time range)  lactated ringers bolus 1,000 mL (0 mLs Intravenous Stopped 05/21/23 1429)    And  lactated ringers bolus 1,000 mL (0 mLs Intravenous Stopped 05/21/23 1429)    And  lactated ringers bolus 250 mL (250 mLs Intravenous New Bag/Given 05/21/23 1427)  ceFEPIme (MAXIPIME) 2 g in sodium chloride 0.9 % 100 mL IVPB (0 g Intravenous Stopped 05/21/23 1429)  metroNIDAZOLE (FLAGYL) IVPB 500 mg (0 mg Intravenous  Stopped 05/21/23 1433)  vancomycin (VANCOREADY) IVPB 1250 mg/250 mL (1,250 mg Intravenous New Bag/Given 05/21/23 1325)  ondansetron (ZOFRAN) injection 4 mg (4 mg Intravenous Given 05/21/23 1318)  morphine (PF) 2 MG/ML injection 2 mg (2 mg Intravenous Given 05/21/23 1425)    ED Course/ Medical Decision Making/ A&P Clinical Course as of 05/21/23 1500  Thu May 21, 2023  1421 Reviewed CT abdomen pelvis consistent with right-sided pyelonephritis [DR]  Clinical Course User Index [DR] Margarita Grizzle, MD                                 Medical Decision Making Amount and/or Complexity of Data Reviewed Labs: ordered. Radiology: ordered. ECG/medicine tests: ordered.  Risk Prescription drug management. Decision regarding hospitalization.  37 year old female presents today complaining of fever, chills, abdominal pain, nausea, reports of leukocytosis and kidney injury. Differential diagnosis includes but is not limited to sepsis, dehydration, pyelonephritis, biliary colic, perforation of viscus, gastritis, pancreatitis Patient was evaluated with labs with leukocytosis of 33,000 with left shift, mild anemia with hemoglobin of 11 Sodium 133, CO2 13, BUN 37, creatinine 4.53 Pregnancy test is negative CT obtained consistent with right-sided pyelonephritis Urinalysis is pending Patient has received 2 L of saline and third liter is ordered Patient is borderline hypotensive First lactic acid is normal at 1.6 Patient received broad-spectrum antibiotics Awaiting hospitalist call for admission  Discussed with Dr. Katrinka Blazing for admission       Final Clinical Impression(s) / ED Diagnoses Final diagnoses:  None    Rx / DC Orders ED Discharge Orders     None         Margarita Grizzle, MD 05/21/23 1502

## 2023-05-21 NOTE — ED Notes (Signed)
Got patient undressed into sa gown on there monitor did EKG shown to Dr Rosalia Hammers patient is resting with family at bedside and call bell in reach

## 2023-05-21 NOTE — ED Notes (Addendum)
Patient is being discharged from the Urgent Care and sent to the Emergency Department via personal vehicle . Per Athena Masse PA, patient is in need of higher level of care due to acute pyelonephritis . Patient is aware and verbalizes understanding of plan of care.  Vitals:   05/21/23 0955  BP: 98/67  Pulse: (!) 115  Resp: 18  Temp: 98.5 F (36.9 C)  SpO2: 98%

## 2023-05-21 NOTE — Sepsis Progress Note (Signed)
Elink monitoring for the code sepsis protocol.  

## 2023-05-21 NOTE — H&P (Signed)
History and Physical    Patient: Christy Whitehead XBJ:[id number] DOB: [birth year] DOA: 05/21/2023 DOS: the patient was seen and examined on 05/21/2023 PCP: Marjie Skiff, NP  Patient coming from: Home  Chief Complaint:  Chief Complaint  Patient presents with   Abdominal Pain   Nausea   HPI: Christy Whitehead is a 37 y.o. female with medical history significant of Hashimoto's thyroiditis, depression, anxiety presents with complaints of abdominal pain and nausea.  Symptoms started about 1 week ago.  The patient reported feeling unwell while out with friends, experiencing feverish symptoms. The patient initially dismissed these symptoms as a potential COVID-19 infection, but a home test was negative. Accompanying the fever were body aches, specifically in the lower back and neck. The patient also reported persistent dizziness and severe nausea, which significantly reduced her appetite and fluid intake.  Denied having any significant vomiting.   The patient has a history of migraines and has been managing the increased frequency of migraines with her usual medication, Imitrex and Zofran. She also reported taking ibuprofen at maximum dosage 800 mg every 6 hours or so for the body aches. Two days ago she Hello this is Dr. Katrinka Blazing started experiencing severe abdominal pain, which she suspected might be an ulcer due to the high dosage of ibuprofen.  The patient also reported a history of discomfort with urination, which started a day before the onset of the fever. She self-medicated with over-the-counter Azo, which seemed to alleviate the symptoms. She also had been constipation, which she attributed to her medication, Zepbound. She took a laxative two days prior to the consultation, which resulted in diarrhea.  In the emergency department patient was noted to be afebrile with tachycardia tachypnea meeting SIRS criteria.  Labs significant for WBC 33.5, hemoglobin 11.4, sodium 133, CO2  13, BUN 37, creatinine 4.53, calcium 7.6, albumin 2.6, and lactic acid 1.6.  Urinalysis positive for small hemoglobin, large leukocytes, 100 protein, few bacteria, 11-20 RBC/hpf, 6-10 epithelial cells/hpf, and greater than 50 WBCs.  Blood cultures were obtained. CT scan of abdomen pelvis have been obtained which noted concern for right pyelonephritis.  Patient was given 3.25 L of IV fluids, Zofran 4 mg IV, morphine 2 mg IV vancomycin, metronidazole, and cefepime.    Review of Systems: As mentioned in the history of present illness. All other systems reviewed and are negative. Past Medical History:  Diagnosis Date   Abnormal Pap smear of cervix 12/19/2008, 02/20/2009   ASCUS   Abnormal Pap smear of cervix 08/06/2010, 10/01/2010   HGSIL   Cervical dysplasia CIN1 06/05/2004   CIN III (cervical intraepithelial neoplasia grade III) with severe dysplasia 12/20/2010   LEEP Ecto Cervix   Dysmenorrhea    Migraines    Past Surgical History:  Procedure Laterality Date   CHOLECYSTECTOMY  2015   COLPOSCOPY  06/05/2004,02/20/2009,10/01/2010   LEEP  12/20/2010   ECC &Endo Cx Negative, Ecto CIN 2-3   Social History:  reports that she quit smoking about 6 years ago. Her smoking use included cigarettes. She started smoking about 19 years ago. She has a 13 pack-year smoking history. She has never used smokeless tobacco. She reports current alcohol use. She reports that she does not use drugs.  Allergies  Allergen Reactions   Aimovig [Erenumab-Aooe] Hives    Site reaction   Emgality [Galcanezumab-Gnlm] Hives    Site reaction    Family History  Problem Relation Age of Onset   COPD Mother    Diabetes Mother  Diabetes Father    Hypertension Father    Bipolar disorder Father    Breast cancer Paternal Grandmother 85   Diabetes Sister    Obesity Sister        morbid   Mental retardation Sister    Healthy Daughter    Suicidality Paternal Grandfather     Prior to Admission medications   Medication  Sig Start Date End Date Taking? Authorizing Provider  acetaminophen (TYLENOL) 500 MG tablet Take 500 mg by mouth every 6 (six) hours as needed.    [provider]  Aspirin-Acetaminophen-Caffeine (EXCEDRIN PO) Take by mouth as needed.    [provider]  Atogepant (QULIPTA) 60 MG TABS Take 1 tablet (60 mg) by mouth daily. 11/04/22   Lomax, Amy, NP  Cholecalciferol 25 MCG (1000 UT) capsule Take 2,000 Units by mouth daily.    [provider]  cyanocobalamin (VITAMIN B12) 1000 MCG tablet Take 1,000 mcg by mouth daily.    [provider]  etonogestrel-ethinyl estradiol (NUVARING) 0.12-0.015 MG/24HR vaginal ring INSERT VAGINALLY AND LEAVE IN PLACE FOR 3 CONSECUTIVE WEEKS, THEN REPLACE FOR CONTINUOUS DOSING DUE TO HEADACHES 08/04/22 08/04/23  Copland, Helmut Muster B, PA-C  Insulin Pen Needle (UNIFINE PENTIPS) 31G X 5 MM MISC Use with Saxenda 07/24/22   Cannady, Corrie Dandy T, NP  levothyroxine (SYNTHROID) 125 MCG tablet Take 1 tablet (125 mcg total) by mouth daily. 01/30/23   Cannady, Corrie Dandy T, NP  nystatin cream (MYCOSTATIN) Apply 1 Application topically 2 (two) times daily. 01/29/23   Cannady, Corrie Dandy T, NP  ondansetron (ZOFRAN-ODT) 4 MG disintegrating tablet Take 1 tablet (4 mg total) by mouth every 8 (eight) hours as needed for nausea or vomiting. 11/04/22   Lomax, Amy, NP  scopolamine (TRANSDERM-SCOP) 1 MG/3DAYS Place 1 patch (1.5 mg total) onto the skin every 3 (three) days. 11/10/22   Cannady, Corrie Dandy T, NP  SUMAtriptan (IMITREX) 100 MG tablet Take 1 tablet (100 mg total) by mouth once as needed for up to 1 dose for migraine. May repeat in 2 hours if headache persists or recurs. 11/04/22   Lomax, Amy, NP  tirzepatide (ZEPBOUND) 10 MG/0.5ML Pen Inject 10 mg into the skin once a week. 01/29/23   Cannady, Corrie Dandy T, NP  topiramate (TOPAMAX) 100 MG tablet Take 1 tablet (100 mg total) by mouth 2 (two) times daily. Please keep upcoming appt in March for continued refills. 11/04/22 11/04/23  Shawnie Dapper, NP    Physical Exam: Vitals:   05/21/23 1209 05/21/23 1210 05/21/23 1300 05/21/23 1422  BP: 95/71  93/66 (!) 86/67  Pulse: (!) 108  100 93  Resp:   14 (!) 26  Temp:      TempSrc:      SpO2: 100%  100% 100%  Weight:  67 kg    Height:  5\' 4"  (1.626 m)     Constitutional: Young female who appears ill, but no acute distress at this time on Eyes: PERRL, lids and conjunctivae normal ENMT: Mucous membranes are moist.  Normal dentition.  Neck: normal, supple,   Respiratory: clear to auscultation bilaterally, no wheezing, no crackles. Normal respiratory effort. No accessory muscle use.  Cardiovascular: Regular rate and rhythm, no murmurs / rubs / gallops. No extremity edema. 2+ pedal pulses.   Abdomen:  flank tenderness noted on the right.  Bowel sounds positive.  Musculoskeletal: no clubbing / cyanosis. No joint deformity upper and lower extremities. Good ROM, no contractures. Normal muscle tone.  Skin: no rashes, lesions, ulcers. No induration  Neurologic: CN 2-12 grossly intact. Sensation intact, DTR normal. Strength 5/5 in all 4.  Psychiatric: Normal judgment and insight. Alert and oriented x 3. Normal mood.   Data Reviewed:  Reviewed labs, imaging, and pertinent records as documented  Assessment and Plan: Sepsis secondary to pyelonephritis Acute.  Patient presented with complaints of abdominal pain.  She was initially noted to be tachycardic with white blood cell count elevated to 33.5.  SIRS criteria.  Urinalysis positive for small hemoglobin, large leukocytes, 100 protein, few bacteria, 11-20 RBC/hpf, 6-10 epithelial cells/hpf, and greater than 50 WBCs. CT noting concern for right-sided pyelonephritis.  Patient noted to have endorgan damage with signs of acute renal failure.  Patient had been started empirically on vancomycin, metronidazole, and cefepime. -Admit to a telemetry bed -Follow-up blood and urine cultures -Check urine culture -Continue Rocephin IV  Acute renal  failure Creatinine elevated up to 4.53 with BUN 37.  Baseline creatinine previously noted to be within normal limits at 0.82 on 04/17/2023.  She reports being able to still make urine.  Patient was bolused over 3 L of fluid. -Strict I&Os -Check salicylate level -Continue normal saline IV fluids  and add sodium bicarb -Daily monitoring of kidney function -Consider formally consulting nephrology if kidney function does not appear to be improving in a.m.  Normocytic anemia Hemoglobin 11.4.  Patient denies any blood in her stools.  Last hemoglobin noted to be 13.3 on 04/17/2023. -Continue to monitor H&H  Hypothyroidism -Check TSH -Continue levothyroxine  Migraine headaches -Continue Topamax  Overweight BMI 25.35 kg/m.  Patient is on Zepbound in outpatient setting.  DVT prophylaxis: Heparin Advance Care Planning:   Code Status: Full Code   Consults:   Family Communication: Patient's significant other updated at bedside  Severity of Illness: The appropriate patient status for this patient is INPATIENT. Inpatient status is judged to be reasonable and necessary in order to provide the required intensity of service to ensure the patient's safety. The patient's presenting symptoms, physical exam findings, and initial radiographic and laboratory data in the context of their chronic comorbidities is felt to place them at high risk for further clinical deterioration. Furthermore, it is not anticipated that the patient will be medically stable for discharge from the hospital within 2 midnights of admission.   * I certify that at the point of admission it is my clinical judgment that the patient will require inpatient hospital care spanning beyond 2 midnights from the point of admission due to high intensity of service, high risk for further deterioration and high frequency of surveillance required.*  Author: Clydie Braun, MD 05/21/2023 2:53 PM  For on call review [email or web address].

## 2023-05-21 NOTE — ED Triage Notes (Signed)
Pt arrives POV from urgent care for abdominal pain and nausea x5 days. Pt reports she has UTI and AKI. Pt reports 9/10 sharp upper abdominal pain upon movement accompanied by nausea and diarrhea.

## 2023-05-21 NOTE — Discharge Instructions (Addendum)
You have been advised to follow up immediately in the emergency department for concerning signs.symptoms. If you declined EMS transport, please have a family member take you directly to the ED at this time. Do not delay. Based on concerns about condition, if you do not follow up in th e ED, you may risk poor outcomes including worsening of condition, delayed treatment and potentially life threatening issues. If you have declined to go to the ED at this time, you should call your PCP immediately to set up a follow up appointment. ° °Go to ED for red flag symptoms, including; fevers you cannot reduce with Tylenol/Motrin, severe headaches, vision changes, numbness/weakness in part of the body, lethargy, confusion, intractable vomiting, severe dehydration, chest pain, breathing difficulty, severe persistent abdominal or pelvic pain, signs of severe infection (increased redness, swelling of an area), feeling faint or passing out, dizziness, etc. You should especially go to the ED for sudden acute worsening of condition if you do not elect to go at this time.  °

## 2023-05-21 NOTE — Progress Notes (Signed)
ED Pharmacy Antibiotic Sign Off An antibiotic consult was received from an ED provider for vancomycin and cefepime per pharmacy dosing for sepsis. A chart review was completed to assess appropriateness.   The following one time order(s) were placed:  Cefepime 2g  Vancomycin 1250mg    Further antibiotic and/or antibiotic pharmacy consults should be ordered by the admitting provider if indicated.   Thank you for allowing pharmacy to be a part of this patient's care.   Estill Batten, PharmD, BCCCP  Clinical Pharmacist 05/21/23 12:04 PM

## 2023-05-21 NOTE — ED Provider Notes (Signed)
[facility]    CSN: [id number] Arrival date & time: 05/21/23  4098      History   Chief Complaint Chief Complaint  Patient presents with   Fever   Abdominal Pain   Nausea   Dizziness   Migraine    HPI Christy Whitehead is a 37 y.o. female presenting for 5-day history of illness.  Reports feeling feverish with fatigue, dizziness, migraines, nausea, lower abdominal pain, dysuria.  States she took AZO and urinary symptoms seem to get a little better but then returned again.  She has also had bilateral lower back pain.  Reports constipation.  Took stool softeners and has had diarrhea for the past couple of days.  She also reports that she has been taking high doses of ibuprofen and Tylenol and now she has developed generalized upper abdominal pain.  She does have a history of cholecystectomy.  Denies cough, congestion, chest pain, shortness of breath, blood in stool, vaginal discharge/itching or odor.  No sick contacts.  She says she feels dehydrated but has been trying to drink fluids.  Has also taken all of her Imitrex and half of her refilled prescription as well as Zofran.  No other complaints.  Medical history significant for migraines, anxiety, Hashimoto's thyroiditis.  HPI  Past Medical History:  Diagnosis Date   Abnormal Pap smear of cervix 12/19/2008, 02/20/2009   ASCUS   Abnormal Pap smear of cervix 08/06/2010, 10/01/2010   HGSIL   Cervical dysplasia CIN1 06/05/2004   CIN III (cervical intraepithelial neoplasia grade III) with severe dysplasia 12/20/2010   LEEP Ecto Cervix   Dysmenorrhea    Migraines     Patient Active Problem List   Diagnosis Date Noted   Eye twitch 04/17/2023   Vitamin D deficiency 03/06/2022   Hashimoto's thyroiditis 10/10/2021   Depression, major, single episode, moderate (HCC) 09/03/2021   Generalized anxiety disorder 09/03/2021   High grade squamous intraepithelial lesion (HGSIL), grade 3 CIN, on biopsy of cervix 07/09/2021    History of cervical dysplasia 02/02/2019   Migraines     Past Surgical History:  Procedure Laterality Date   CHOLECYSTECTOMY  2015   COLPOSCOPY  06/05/2004,02/20/2009,10/01/2010   LEEP  12/20/2010   ECC &Endo Cx Negative, Ecto CIN 2-3    OB History     Gravida  1   Para  1   Term  1   Preterm      AB      Living  1      SAB      IAB      Ectopic      Multiple      Live Births               Home Medications    Prior to Admission medications   Medication Sig Start Date End Date Taking? Authorizing Provider  acetaminophen (TYLENOL) 500 MG tablet Take 500 mg by mouth every 6 (six) hours as needed.    [provider]  Aspirin-Acetaminophen-Caffeine (EXCEDRIN PO) Take by mouth as needed.    [provider]  Atogepant (QULIPTA) 60 MG TABS Take 1 tablet (60 mg) by mouth daily. 11/04/22   Lomax, Amy, NP  Cholecalciferol 25 MCG (1000 UT) capsule Take 2,000 Units by mouth daily.    [provider]  cyanocobalamin (VITAMIN B12) 1000 MCG tablet Take 1,000 mcg by mouth daily.    [provider]  etonogestrel-ethinyl estradiol (NUVARING) 0.12-0.015 MG/24HR vaginal ring INSERT VAGINALLY AND LEAVE  IN PLACE FOR 3 CONSECUTIVE WEEKS, THEN REPLACE FOR CONTINUOUS DOSING DUE TO HEADACHES 08/04/22 08/04/23  Copland, Ilona Sorrel, PA-C  Insulin Pen Needle (UNIFINE PENTIPS) 31G X 5 MM MISC Use with Saxenda 07/24/22   Cannady, Corrie Dandy T, NP  levothyroxine (SYNTHROID) 125 MCG tablet Take 1 tablet (125 mcg total) by mouth daily. 01/30/23   Cannady, Corrie Dandy T, NP  nystatin cream (MYCOSTATIN) Apply 1 Application topically 2 (two) times daily. 01/29/23   Cannady, Corrie Dandy T, NP  ondansetron (ZOFRAN-ODT) 4 MG disintegrating tablet Take 1 tablet (4 mg total) by mouth every 8 (eight) hours as needed for nausea or vomiting. 11/04/22   Lomax, Amy, NP  scopolamine (TRANSDERM-SCOP) 1 MG/3DAYS Place 1 patch (1.5 mg total) onto the skin every 3 (three) days. 11/10/22   Cannady, Corrie Dandy  T, NP  SUMAtriptan (IMITREX) 100 MG tablet Take 1 tablet (100 mg total) by mouth once as needed for up to 1 dose for migraine. May repeat in 2 hours if headache persists or recurs. 11/04/22   Lomax, Amy, NP  tirzepatide (ZEPBOUND) 10 MG/0.5ML Pen Inject 10 mg into the skin once a week. 01/29/23   Cannady, Corrie Dandy T, NP  topiramate (TOPAMAX) 100 MG tablet Take 1 tablet (100 mg total) by mouth 2 (two) times daily. Please keep upcoming appt in March for continued refills. 11/04/22 11/04/23  Shawnie Dapper, NP    Family History Family History  Problem Relation Age of Onset   COPD Mother    Diabetes Mother    Diabetes Father    Hypertension Father    Bipolar disorder Father    Breast cancer Paternal Grandmother 67   Diabetes Sister    Obesity Sister        morbid   Mental retardation Sister    Healthy Daughter    Suicidality Paternal Grandfather     Social History Social History   Tobacco Use   Smoking status: Former    Current packs/day: 0.00    Average packs/day: 1 pack/day for 13.0 years (13.0 ttl pk-yrs)    Types: Cigarettes    Start date: 2005    Quit date: 2018    Years since quitting: 6.9   Smokeless tobacco: Never  Vaping Use   Vaping status: Never Used  Substance Use Topics   Alcohol use: Yes    Comment: once every 2 weeks, occasionally >4   Drug use: No     Allergies   Aimovig [erenumab-aooe] and Emgality [galcanezumab-gnlm]   Review of Systems Review of Systems  Constitutional:  Positive for appetite change, fatigue and fever (subjective).  HENT:  Negative for congestion.   Respiratory:  Negative for cough and shortness of breath.   Cardiovascular:  Negative for chest pain.  Gastrointestinal:  Positive for abdominal pain, constipation and diarrhea. Negative for blood in stool, nausea and vomiting.  Genitourinary:  Positive for dysuria and flank pain. Negative for difficulty urinating, frequency, genital sores, hematuria, pelvic pain, urgency and vaginal discharge.   Musculoskeletal:  Positive for back pain.  Neurological:  Positive for dizziness and headaches. Negative for syncope and weakness.     Physical Exam Triage Vital Signs ED Triage Vitals  Encounter Vitals Group     BP 05/21/23 0955 98/67     Systolic BP Percentile --      Diastolic BP Percentile --      Pulse Rate 05/21/23 0955 (!) 115     Resp 05/21/23 0955 18     Temp 05/21/23 0955 98.5 F (36.9  C)     Temp Source 05/21/23 0955 Oral     SpO2 05/21/23 0955 98 %     Weight --      Height --      Head Circumference --      Peak Flow --      Pain Score 05/21/23 0942 7     Pain Loc --      Pain Education --      Exclude from Growth Chart --    No data found.  Updated Vital Signs BP 98/67 (BP Location: Right Arm)   Pulse (!) 115   Temp 98.5 F (36.9 C) (Oral)   Resp 18   SpO2 98%     Physical Exam Vitals and nursing note reviewed.  Constitutional:      General: She is not in acute distress.    Appearance: Normal appearance. She is ill-appearing. She is not toxic-appearing.  HENT:     Head: Normocephalic and atraumatic.     Nose: Nose normal.     Mouth/Throat:     Mouth: Mucous membranes are dry.     Pharynx: Oropharynx is clear.  Eyes:     General: No scleral icterus.       Right eye: No discharge.        Left eye: No discharge.     Conjunctiva/sclera: Conjunctivae normal.  Cardiovascular:     Rate and Rhythm: Regular rhythm. Tachycardia present.     Heart sounds: Normal heart sounds.  Pulmonary:     Effort: Pulmonary effort is normal. No respiratory distress.     Breath sounds: Normal breath sounds.  Abdominal:     Palpations: Abdomen is soft.     Tenderness: There is abdominal tenderness (generalized). There is right CVA tenderness and guarding (epigastric, LUQ). There is no left CVA tenderness or rebound.  Musculoskeletal:     Cervical back: Neck supple.  Skin:    General: Skin is dry.  Neurological:     General: No focal deficit present.     Mental  Status: She is alert. Mental status is at baseline.     Motor: No weakness.     Gait: Gait normal.  Psychiatric:        Mood and Affect: Mood normal.        Behavior: Behavior normal.      UC Treatments / Results  Labs (all labs ordered are listed, but only abnormal results are displayed) Labs Reviewed  URINALYSIS, W/ REFLEX TO CULTURE (INFECTION SUSPECTED) - Abnormal; Notable for the following components:      Result Value   APPearance CLOUDY (*)    Hgb urine dipstick SMALL (*)    Bilirubin Urine SMALL (*)    Ketones, ur TRACE (*)    Protein, ur >300 (*)    Leukocytes,Ua SMALL (*)    Bacteria, UA MANY (*)    All other components within normal limits  COMPREHENSIVE METABOLIC PANEL - Abnormal; Notable for the following components:   Sodium 131 (*)    CO2 14 (*)    BUN 41 (*)    Creatinine, Ser 4.34 (*)    Calcium 7.6 (*)    Total Protein 6.4 (*)    Albumin 2.6 (*)    GFR, Estimated 13 (*)    All other components within normal limits  CBC WITH DIFFERENTIAL/PLATELET - Abnormal; Notable for the following components:   WBC 32.9 (*)    Hemoglobin 11.4 (*)    HCT 33.9 (*)  Neutro Abs 30.3 (*)    Monocytes Absolute 1.3 (*)    Abs Immature Granulocytes 0.70 (*)    All other components within normal limits  LIPASE, BLOOD    EKG   Radiology No results found.  Procedures Procedures (including critical care time)  Medications Ordered in UC Medications - No data to display  Initial Impression / Assessment and Plan / UC Course  I have reviewed the triage vital signs and the nursing notes.  Pertinent labs & imaging results that were available during my care of the patient were reviewed by me and considered in my medical decision making (see chart for details).   37 year old female presents for 5-day history of illness.  Reports subjective fever, loss of appetite, fatigue, lower abdominal pain, dysuria, upper abdominal pain, nausea, migraines, dizziness.  Taking  Imitrex, Zofran, ibuprofen and Tylenol.  Patient is currently afebrile.  Pulse elevated at 115 bpm.  Appears ill and tired.  Nontoxic.  Dry mucous membranes.  Throat clear.  Chest clear.  Heart regular rhythm.  Abdomen soft with tenderness throughout the upper and lower abdomen but most tenderness of the epigastric area and left upper quadrant.  Urinalysis ordered.  Patient only able to leave very small urine sample at this time. Will obtain CBC, CMP and lipase.  Urinalysis shows cloudy urine with small hemoglobin, small bilirubin, trace ketones, greater than 300 protein, small leukocytes, many bacteria.  Patient did not leave enough urine for culture and is unable to leave any more urine.  CBC shows elevated WBC count of 32.9 with a left shift.  CMP shows decreased sodium of 131, increased BUN 41, increased creatinine 4.34 and GFR of 13.  Lipase normal.  Reviewed all results with patient.  Explained to her that she likely has acute pyelonephritis but could have sepsis.  Concern for dehydration, acute kidney injury.  Encouraged that she immediately go to the emergency department.  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  She says she will call around and see whoever has low with wait times and go to that department.  She will likely go to [facility] ED.  Declines EMS transport.  Leaving in stable condition with her partner taking her.  Acute illness with systemic symptoms and organ injury.  Patient has an acute condition with threat to bodily injury and possible death.  Suspect patient will potentially be admitted at least for observation or kept in ED for a long period of time until renal function improves.   Final Clinical Impressions(s) / UC Diagnoses   Final diagnoses:  Dehydration  Dizziness  Acute pyelonephritis  Acute kidney injury [facility])     Discharge Instructions      You have been advised to follow up immediately in the emergency department for concerning signs.symptoms. If you  declined EMS transport, please have a family member take you directly to the ED at this time. Do not delay. Based on concerns about condition, if you do not follow up in th e ED, you may risk poor outcomes including worsening of condition, delayed treatment and potentially life threatening issues. If you have declined to go to the ED at this time, you should call your PCP immediately to set up a follow up appointment.  Go to ED for red flag symptoms, including; fevers you cannot reduce with Tylenol/Motrin, severe headaches, vision changes, numbness/weakness in part of the body, lethargy, confusion, intractable vomiting, severe dehydration, chest pain, breathing difficulty, severe persistent abdominal or  pelvic pain, signs of severe infection (increased redness, swelling of an area), feeling faint or passing out, dizziness, etc. You should especially go to the ED for sudden acute worsening of condition if you do not elect to go at this time.      ED Prescriptions   None    PDMP not reviewed this encounter.   Shirlee Latch, PA-C 05/21/23 1132

## 2023-05-22 DIAGNOSIS — N12 Tubulo-interstitial nephritis, not specified as acute or chronic: Secondary | ICD-10-CM | POA: Diagnosis not present

## 2023-05-22 LAB — BLOOD CULTURE ID PANEL (REFLEXED) - BCID2

## 2023-05-22 LAB — BASIC METABOLIC PANEL
Anion gap: 14 (ref 5–15)
BUN: 33 mg/dL — ABNORMAL HIGH (ref 6–20)
CO2: 15 mmol/L — ABNORMAL LOW (ref 22–32)
Calcium: 6.9 mg/dL — ABNORMAL LOW (ref 8.9–10.3)
Chloride: 106 mmol/L (ref 98–111)
Creatinine, Ser: 3.06 mg/dL — ABNORMAL HIGH (ref 0.44–1.00)
GFR, Estimated: 19 mL/min — ABNORMAL LOW (ref >60)
Glucose, Bld: 83 mg/dL (ref 70–99)
Potassium: 3.3 mmol/L — ABNORMAL LOW (ref 3.5–5.1)
Sodium: 135 mmol/L (ref 135–145)

## 2023-05-22 LAB — C-REACTIVE PROTEIN: CRP: 26.4 mg/dL — ABNORMAL HIGH (ref <1.0)

## 2023-05-22 LAB — CBC
HCT: 32.8 % — ABNORMAL LOW (ref 36.0–46.0)
Hemoglobin: 10.6 g/dL — ABNORMAL LOW (ref 12.0–15.0)
MCH: 28.6 pg (ref 26.0–34.0)
MCHC: 32.3 g/dL (ref 30.0–36.0)
MCV: 88.6 fL (ref 80.0–100.0)
Platelets: 251 10*3/uL (ref 150–400)
RBC: 3.7 MIL/uL — ABNORMAL LOW (ref 3.87–5.11)
RDW: 13.2 % (ref 11.5–15.5)
WBC: 26.1 10*3/uL — ABNORMAL HIGH (ref 4.0–10.5)
nRBC: 0 % (ref 0.0–0.2)

## 2023-05-22 LAB — PROCALCITONIN: Procalcitonin: 5.49 ng/mL

## 2023-05-22 MED ORDER — LOPERAMIDE HCL 2 MG PO CAPS
2.0000 mg | ORAL_CAPSULE | ORAL | Status: DC | PRN
  Administered 2023-05-22 – 2023-05-23 (×3): 2 mg via ORAL
  Filled 2023-05-22 (×3): qty 1

## 2023-05-22 MED ORDER — POTASSIUM CHLORIDE CRYS ER 20 MEQ PO TBCR
40.0000 meq | EXTENDED_RELEASE_TABLET | Freq: Two times a day (BID) | ORAL | Status: AC
  Administered 2023-05-22 (×2): 40 meq via ORAL
  Filled 2023-05-22 (×2): qty 2

## 2023-05-22 MED ORDER — LACTATED RINGERS IV SOLN: INTRAVENOUS | Status: DC

## 2023-05-22 NOTE — Progress Notes (Signed)
PHARMACY - PHYSICIAN COMMUNICATION CRITICAL VALUE ALERT - BLOOD CULTURE IDENTIFICATION (BCID)  Christy Whitehead is an 37 y.o. female who presented to [facility] on 05/21/2023 with a chief complaint of sepsis.  Assessment:  E. Coli bacteremia, likely urinary source.  Name of physician (or Provider) Contacted: Dr. Susa Raring  Current antibiotics: Ceftriaxone  Changes to prescribed antibiotics recommended:  Patient is on recommended antibiotics - No changes needed  Results for orders placed or performed during the hospital encounter of 05/21/23  Blood Culture ID Panel (Reflexed) (Collected: 05/21/2023 12:02 PM)  Result Value Ref Range   Enterococcus faecalis NOT DETECTED NOT DETECTED   Enterococcus Faecium NOT DETECTED NOT DETECTED   Listeria monocytogenes NOT DETECTED NOT DETECTED   Staphylococcus species NOT DETECTED NOT DETECTED   Staphylococcus aureus (BCID) NOT DETECTED NOT DETECTED   Staphylococcus epidermidis NOT DETECTED NOT DETECTED   Staphylococcus lugdunensis NOT DETECTED NOT DETECTED   Streptococcus species NOT DETECTED NOT DETECTED   Streptococcus agalactiae NOT DETECTED NOT DETECTED   Streptococcus pneumoniae NOT DETECTED NOT DETECTED   Streptococcus pyogenes NOT DETECTED NOT DETECTED   A.calcoaceticus-baumannii NOT DETECTED NOT DETECTED   Bacteroides fragilis NOT DETECTED NOT DETECTED   Enterobacterales DETECTED (A) NOT DETECTED   Enterobacter cloacae complex NOT DETECTED NOT DETECTED   Escherichia coli DETECTED (A) NOT DETECTED   Klebsiella aerogenes NOT DETECTED NOT DETECTED   Klebsiella oxytoca NOT DETECTED NOT DETECTED   Klebsiella pneumoniae NOT DETECTED NOT DETECTED   Proteus species NOT DETECTED NOT DETECTED   Salmonella species NOT DETECTED NOT DETECTED   Serratia marcescens NOT DETECTED NOT DETECTED   Haemophilus influenzae NOT DETECTED NOT DETECTED   Neisseria meningitidis NOT DETECTED NOT DETECTED   Pseudomonas aeruginosa NOT DETECTED NOT  DETECTED   Stenotrophomonas maltophilia NOT DETECTED NOT DETECTED   Candida albicans NOT DETECTED NOT DETECTED   Candida auris NOT DETECTED NOT DETECTED   Candida glabrata NOT DETECTED NOT DETECTED   Candida krusei NOT DETECTED NOT DETECTED   Candida parapsilosis NOT DETECTED NOT DETECTED   Candida tropicalis NOT DETECTED NOT DETECTED   Cryptococcus neoformans/gattii NOT DETECTED NOT DETECTED   CTX-M ESBL NOT DETECTED NOT DETECTED   Carbapenem resistance IMP NOT DETECTED NOT DETECTED   Carbapenem resistance KPC NOT DETECTED NOT DETECTED   Carbapenem resistance NDM NOT DETECTED NOT DETECTED   Carbapenem resist OXA 48 LIKE NOT DETECTED NOT DETECTED   Carbapenem resistance VIM NOT DETECTED NOT DETECTED    Dalene Carrow 05/22/2023  8:26 AM

## 2023-05-22 NOTE — Progress Notes (Signed)
PROGRESS NOTE                                                                                                                                                                                                             Patient Demographics:    Christy Whitehead, is a 37 y.o. female, DOB - 1985-10-31, WGN:[id number]  Outpatient Primary MD for the patient is Christy Skiff, NP    LOS - 1  Admit date - 05/21/2023    Chief Complaint  Patient presents with   Abdominal Pain   Nausea       Brief Narrative (HPI from H&P)   37 y.o. female with medical history significant of Hashimoto's thyroiditis, depression, anxiety presents with complaints of abdominal pain and nausea.  Symptoms started about 1 week ago.  The patient reported feeling unwell while out with friends, experiencing feverish symptoms. The patient initially dismissed these symptoms as a potential COVID-19 infection, but a home test was negative. Accompanying the fever were body aches, specifically in the lower back and neck. The patient also reported persistent dizziness and severe nausea, which significantly reduced her appetite and fluid intake.  Her further workup in the ER showed sepsis due to right-sided pyelonephritis.   Subjective:    [name] today has, No headache, No chest pain, right-sided flank pain and some upper generalized abdominal pain - No Nausea, No new weakness tingling or numbness, no shortness of breath   Assessment  & Plan :    Sepsis secondary to right-sided pyelonephritis With E. coli bacteremia, she had been sick for about 6 to 7 days prior to hospital visit, developed AKI due to sepsis as well, on appropriate antibiotics, IV fluids for hydration, sepsis pathophysiology has improved, renal function improving.  Continue to monitor clinically.  CT scan reassuring without any obstruction.   Acute renal failure As #1 above    Normocytic anemia Hemoglobin 11.4.  Patient denies any blood in her stools.  Last hemoglobin noted to be 13.3 on 04/17/2023. -Continue to monitor H&H, PCP to monitor outpatient   Hypothyroidism -Continue levothyroxine, stable TSH   Migraine headaches -Continue Topamax   Hypokalemia.  Replace.    Overweight BMI 25.35 kg/m.  Patient is on Zepbound in outpatient setting.      Condition - Fair  Family Communication  : Family member bedside  on the recliner on 05/22/2023  Code Status :  Full  Consults  :  None  PUD Prophylaxis :    Procedures  :     CT - 1. Findings consistent with right-sided pyelonephritis. 2. Hepatosplenomegaly. Fatty infiltration of the liver.      Disposition Plan  :    Status is: Inpatient  DVT Prophylaxis  :    heparin injection 5,000 Units Start: 05/21/23 1607   Lab Results  Component Value Date   PLT 251 05/22/2023    Diet :  Diet Order             Diet regular Room service appropriate? Yes; Fluid consistency: Thin  Diet effective now                    Inpatient Medications  Scheduled Meds:  Atogepant  60 mg Oral Daily   fluticasone  1-2 spray Each Nare Daily   heparin  5,000 Units Subcutaneous Q8H   levothyroxine  125 mcg Oral Daily   potassium chloride  40 mEq Oral BID   sodium chloride flush  3 mL Intravenous Q12H   topiramate  100 mg Oral BID   Continuous Infusions:  cefTRIAXone (ROCEPHIN)  IV 2 g (05/22/23 0929)   lactated ringers 75 mL/hr at 05/22/23 0907   PRN Meds:.acetaminophen **OR** acetaminophen, albuterol, HYDROcodone-acetaminophen, loperamide, morphine injection, ondansetron **OR** ondansetron (ZOFRAN) IV, oxymetazoline, SUMAtriptan  Antibiotics  :    Anti-infectives (From admission, onward)    Start     Dose/Rate Route Frequency Ordered Stop   05/22/23 1000  cefTRIAXone (ROCEPHIN) 2 g in sodium chloride 0.9 % 100 mL IVPB        2 g 200 mL/hr over 30 Minutes Intravenous Every 24 hours 05/21/23  1506     05/21/23 1215  ceFEPIme (MAXIPIME) 2 g in sodium chloride 0.9 % 100 mL IVPB        2 g 200 mL/hr over 30 Minutes Intravenous  Once 05/21/23 1203 05/21/23 1429   05/21/23 1215  metroNIDAZOLE (FLAGYL) IVPB 500 mg        500 mg 100 mL/hr over 60 Minutes Intravenous  Once 05/21/23 1203 05/21/23 1433   05/21/23 1215  vancomycin (VANCOCIN) IVPB 1000 mg/200 mL premix  Status:  Discontinued        1,000 mg 200 mL/hr over 60 Minutes Intravenous  Once 05/21/23 1203 05/21/23 1203   05/21/23 1215  vancomycin (VANCOREADY) IVPB 1250 mg/250 mL        1,250 mg 166.7 mL/hr over 90 Minutes Intravenous  Once 05/21/23 1204 05/21/23 1514         Objective:   Vitals:   05/22/23 0420 05/22/23 0500 05/22/23 0614 05/22/23 0827  BP: 106/74   100/67  Pulse: (!) 128   (!) 125  Resp: 19   18  Temp:  98.5 F (36.9 C) 98.1 F (36.7 C) 98.1 F (36.7 C)  TempSrc:  Oral  Oral  SpO2: 100%   98%  Weight:      Height:        Wt Readings from Last 3 Encounters:  05/21/23 67 kg  04/17/23 67 kg  01/29/23 67 kg     Intake/Output Summary (Last 24 hours) at 05/22/2023 1017 Last data filed at 05/21/2023 2305 Gross per 24 hour  Intake 480 ml  Output --  Net 480 ml     Physical Exam  Awake Alert, No new F.N deficits, Normal affect [place].AT,PERRAL Supple Neck,  No JVD,   Symmetrical Chest wall movement, Good air movement bilaterally, CTAB RRR,No Gallops,Rubs or new Murmurs,  +ve B.Sounds, Abd Soft, No tenderness,  +ve R flank tenderness No Cyanosis, Clubbing or edema      Data Review:    Recent Labs  Lab 05/21/23 1035 05/21/23 1243 05/22/23 0524  WBC 32.9* 33.5* 26.1*  HGB 11.4* 11.4* 10.6*  HCT 33.9* 35.1* 32.8*  PLT 289 290 251  MCV 86.5 88.6 88.6  MCH 29.1 28.8 28.6  MCHC 33.6 32.5 32.3  RDW 13.1 13.0 13.2  LYMPHSABS 0.7 0.5*  --   MONOABS 1.3* 1.5*  --   EOSABS 0.0 0.0  --   BASOSABS 0.0 0.1  --     Recent Labs  Lab 05/21/23 1035 05/21/23 1243 05/21/23 1250  05/21/23 1642 05/21/23 2021 05/22/23 0524 05/22/23 0814  NA 131* 133*  --   --  132* 135  --   K 3.6 3.6  --   --  3.3* 3.3*  --   CL 102 104  --   --  106 106  --   CO2 14* 13*  --   --  15* 15*  --   ANIONGAP 15 16*  --   --  11 14  --   GLUCOSE 89 90  --   --  106* 83  --   BUN 41* 37*  --   --  34* 33*  --   CREATININE 4.34* 4.53*  --   --  3.58* 3.06*  --   AST 24 24  --   --   --   --   --   ALT 22 21  --   --   --   --   --   ALKPHOS 104 126  --   --   --   --   --   BILITOT 0.5 0.7  --   --   --   --   --   ALBUMIN 2.6* 2.3*  --   --   --   --   --   CRP  --   --   --   --   --   --  26.4*  PROCALCITON  --   --   --   --   --  5.49  --   LATICACIDVEN  --   --  1.6  --   --   --   --   INR  --  1.3*  --   --   --   --   --   TSH  --   --   --  4.067  --   --   --   CALCIUM 7.6* 7.7*  --   --  7.1* 6.9*  --       Recent Labs  Lab 05/21/23 1035 05/21/23 1243 05/21/23 1250 05/21/23 1642 05/21/23 2021 05/22/23 0524 05/22/23 0814  CRP  --   --   --   --   --   --  26.4*  PROCALCITON  --   --   --   --   --  5.49  --   LATICACIDVEN  --   --  1.6  --   --   --   --   INR  --  1.3*  --   --   --   --   --   TSH  --   --   --  4.067  --   --   --  CALCIUM 7.6* 7.7*  --   --  7.1* 6.9*  --     --------------------------------------------------------------------------------------------------------------- Lab Results  Component Value Date   CHOL 191 04/17/2023   HDL 70 04/17/2023   LDLCALC 93 04/17/2023   TRIG 166 (H) 04/17/2023   CHOLHDL 3 09/03/2021    No results found for: "HGBA1C" Recent Labs    05/21/23 1642  TSH 4.067      Micro Results Recent Results (from the past 240 hours)  Resp panel by RT-PCR (RSV, Flu A&B, Covid) Anterior Nasal Swab     Status: None   Collection Time: 05/21/23 12:02 PM   Specimen: Anterior Nasal Swab  Result Value Ref Range Status   SARS Coronavirus 2 by RT PCR NEGATIVE NEGATIVE Final   Influenza A by PCR NEGATIVE NEGATIVE  Final   Influenza B by PCR NEGATIVE NEGATIVE Final    Comment: (NOTE) The Xpert Xpress SARS-CoV-2/FLU/RSV plus assay is intended as an aid in the diagnosis of influenza from Nasopharyngeal swab specimens and should not be used as a sole basis for treatment. Nasal washings and aspirates are unacceptable for Xpert Xpress SARS-CoV-2/FLU/RSV testing.  Fact Sheet for Patients: BloggerCourse.com  Fact Sheet for Healthcare Providers: SeriousBroker.it  This test is not yet approved or cleared by the [place] FDA and has been authorized for detection and/or diagnosis of SARS-CoV-2 by FDA under an Emergency Use Authorization (EUA). This EUA will remain in effect (meaning this test can be used) for the duration of the COVID-19 declaration under Section 564(b)(1) of the Act, 21 U.S.C. section 360bbb-3(b)(1), unless the authorization is terminated or revoked.     Resp Syncytial Virus by PCR NEGATIVE NEGATIVE Final    Comment: (NOTE) Fact Sheet for Patients: BloggerCourse.com  Fact Sheet for Healthcare Providers: SeriousBroker.it  This test is not yet approved or cleared by the [place] FDA and has been authorized for detection and/or diagnosis of SARS-CoV-2 by FDA under an Emergency Use Authorization (EUA). This EUA will remain in effect (meaning this test can be used) for the duration of the COVID-19 declaration under Section 564(b)(1) of the Act, 21 U.S.C. section 360bbb-3(b)(1), unless the authorization is terminated or revoked.  Performed at [facility], [address], [place], [place] [address]   Blood Culture (routine x 2)     Status: None (Preliminary result)   Collection Time: 05/21/23 12:02 PM   Specimen: BLOOD  Result Value Ref Range Status   Specimen Description BLOOD RIGHT ANTECUBITAL  Final   Special Requests   Final    BOTTLES DRAWN AEROBIC AND  ANAEROBIC Blood Culture results may not be optimal due to an inadequate volume of blood received in culture bottles   Culture  Setup Time   Final    GRAM NEGATIVE RODS AEROBIC BOTTLE ONLY CRITICAL RESULT CALLED TO, READ BACK BY AND VERIFIED WITH: Roswell Nickel [phone] AT 0822 BY EC Performed at [facility], [address], [place], [place] [address]    Culture GRAM NEGATIVE RODS  Final   Report Status PENDING  Incomplete  Blood Culture ID Panel (Reflexed)     Status: Abnormal   Collection Time: 05/21/23 12:02 PM  Result Value Ref Range Status   Enterococcus faecalis NOT DETECTED NOT DETECTED Final   Enterococcus Faecium NOT DETECTED NOT DETECTED Final   Listeria monocytogenes NOT DETECTED NOT DETECTED Final   Staphylococcus species NOT DETECTED NOT DETECTED Final   Staphylococcus aureus (BCID) NOT DETECTED NOT DETECTED Final  Staphylococcus epidermidis NOT DETECTED NOT DETECTED Final   Staphylococcus lugdunensis NOT DETECTED NOT DETECTED Final   Streptococcus species NOT DETECTED NOT DETECTED Final   Streptococcus agalactiae NOT DETECTED NOT DETECTED Final   Streptococcus pneumoniae NOT DETECTED NOT DETECTED Final   Streptococcus pyogenes NOT DETECTED NOT DETECTED Final   A.calcoaceticus-baumannii NOT DETECTED NOT DETECTED Final   Bacteroides fragilis NOT DETECTED NOT DETECTED Final   Enterobacterales DETECTED (A) NOT DETECTED Final    Comment: Enterobacterales represent a large order of gram negative bacteria, not a single organism. CRITICAL RESULT CALLED TO, READ BACK BY AND VERIFIED WITH: PHARMD BAILEY AGEE [phone] AT 0822 BY EC    Enterobacter cloacae complex NOT DETECTED NOT DETECTED Final   Escherichia coli DETECTED (A) NOT DETECTED Final    Comment: CRITICAL RESULT CALLED TO, READ BACK BY AND VERIFIED WITH: PHARMD BAILEY AGEE [phone] AT 0822 BY EC    Klebsiella aerogenes NOT DETECTED NOT DETECTED Final   Klebsiella oxytoca NOT DETECTED NOT DETECTED Final    Klebsiella pneumoniae NOT DETECTED NOT DETECTED Final   Proteus species NOT DETECTED NOT DETECTED Final   Salmonella species NOT DETECTED NOT DETECTED Final   Serratia marcescens NOT DETECTED NOT DETECTED Final   Haemophilus influenzae NOT DETECTED NOT DETECTED Final   Neisseria meningitidis NOT DETECTED NOT DETECTED Final   Pseudomonas aeruginosa NOT DETECTED NOT DETECTED Final   Stenotrophomonas maltophilia NOT DETECTED NOT DETECTED Final   Candida albicans NOT DETECTED NOT DETECTED Final   Candida auris NOT DETECTED NOT DETECTED Final   Candida glabrata NOT DETECTED NOT DETECTED Final   Candida krusei NOT DETECTED NOT DETECTED Final   Candida parapsilosis NOT DETECTED NOT DETECTED Final   Candida tropicalis NOT DETECTED NOT DETECTED Final   Cryptococcus neoformans/gattii NOT DETECTED NOT DETECTED Final   CTX-M ESBL NOT DETECTED NOT DETECTED Final   Carbapenem resistance IMP NOT DETECTED NOT DETECTED Final   Carbapenem resistance KPC NOT DETECTED NOT DETECTED Final   Carbapenem resistance NDM NOT DETECTED NOT DETECTED Final   Carbapenem resist OXA 48 LIKE NOT DETECTED NOT DETECTED Final   Carbapenem resistance VIM NOT DETECTED NOT DETECTED Final    Comment: Performed at [facility], [address] [place], [place] [address]  Blood Culture (routine x 2)     Status: None (Preliminary result)   Collection Time: 05/21/23 12:07 PM   Specimen: BLOOD LEFT ARM  Result Value Ref Range Status   Specimen Description BLOOD LEFT ARM  Final   Special Requests   Final    BOTTLES DRAWN AEROBIC AND ANAEROBIC Blood Culture adequate volume   Culture   Final    NO GROWTH < 24 HOURS Performed at [facility], [address], [place], [place] [address]    Report Status PENDING  Incomplete    Radiology Reports CT ABDOMEN PELVIS WO CONTRAST Result Date: 05/21/2023 CLINICAL DATA:  Abdominal pain. EXAM: CT ABDOMEN AND PELVIS WITHOUT CONTRAST TECHNIQUE: Multidetector CT imaging of the  abdomen and pelvis was performed following the standard protocol without IV contrast. RADIATION DOSE REDUCTION: This exam was performed according to the departmental dose-optimization program which includes automated exposure control, adjustment of the mA and/or kV according to patient size and/or use of iterative reconstruction technique. COMPARISON:  None Available. FINDINGS: Lower chest: Dependent subsegmental atelectasis. No pericardial or pleural effusion. Hepatobiliary: Enlarged liver with decreased attenuation consistent with hepatic fatty infiltration. Status post cholecystectomy. Pancreas: Unremarkable. No pancreatic ductal dilatation or surrounding inflammatory changes.  Spleen: Splenomegaly, 13 x 12 cm Adrenals/Urinary Tract: No adrenal lesions. Right-sided hydronephrosis and perinephric stranding consistent with pyelonephritis without evidence for nephrolithiasis. Unremarkable urinary bladder. Stomach/Bowel: Stomach is within normal limits. Appendix appears normal. No evidence of bowel wall thickening, distention, or inflammatory changes. Vascular/Lymphatic: No significant vascular findings are present. No enlarged abdominal or pelvic lymph nodes. Reproductive: Uterus and bilateral adnexa are unremarkable. Other: No abdominal wall hernia or abnormality. No abdominopelvic ascites. Musculoskeletal: No acute or significant osseous findings. IMPRESSION: 1. Findings consistent with right-sided pyelonephritis. 2. Hepatosplenomegaly. Fatty infiltration of the liver. Electronically Signed   By: Layla Maw M.D.   On: 05/21/2023 14:00   DG Chest Port 1 View Result Date: 05/21/2023 CLINICAL DATA:  Possible sepsis. EXAM: PORTABLE CHEST 1 VIEW COMPARISON:  None Available. FINDINGS: The heart size and mediastinal contours are within normal limits. Both lungs are clear. The visualized skeletal structures are unremarkable. IMPRESSION: No active disease. Electronically Signed   By: Lupita Raider M.D.   On:  05/21/2023 12:34      Signature  -   Susa Raring M.D on 05/22/2023 at 10:17 AM   -  To page go to [email or web address]

## 2023-05-22 NOTE — TOC CM/SW Note (Signed)
Transition of Care [facility]) - Inpatient Brief Assessment   Patient Details  Name: Christy Whitehead MRN: [id number] Date of Birth: December 19, 1985  Transition of Care [facility]) CM/SW Contact:    Mearl Latin, LCSW Phone Number: 05/22/2023, 3:32 PM   Clinical Narrative: Patient admitted from home undergoing workup for pyelonephritis. No current TOC needs identified at this time but please place consult if needs arise.    Transition of Care Asessment: Insurance and Status: Insurance coverage has been reviewed Patient has primary care physician: Yes Home environment has been reviewed: From home Prior level of function:: Independent Prior/Current Home Services: No current home services Social Drivers of Health Review: SDOH reviewed no interventions necessary Readmission risk has been reviewed: Yes Transition of care needs: no transition of care needs at this time

## 2023-05-23 DIAGNOSIS — N12 Tubulo-interstitial nephritis, not specified as acute or chronic: Secondary | ICD-10-CM | POA: Diagnosis not present

## 2023-05-23 LAB — CBC WITH DIFFERENTIAL/PLATELET
Abs Immature Granulocytes: 0 10*3/uL (ref 0.00–0.07)
Basophils Absolute: 0 10*3/uL (ref 0.0–0.1)
Basophils Relative: 0 %
Eosinophils Absolute: 0 10*3/uL (ref 0.0–0.5)
Eosinophils Relative: 0 %
HCT: 30.7 % — ABNORMAL LOW (ref 36.0–46.0)
Hemoglobin: 10.2 g/dL — ABNORMAL LOW (ref 12.0–15.0)
Lymphocytes Relative: 7 %
Lymphs Abs: 1 10*3/uL (ref 0.7–4.0)
MCH: 28.7 pg (ref 26.0–34.0)
MCHC: 33.2 g/dL (ref 30.0–36.0)
MCV: 86.5 fL (ref 80.0–100.0)
Monocytes Absolute: 0.6 10*3/uL (ref 0.1–1.0)
Monocytes Relative: 4 %
Neutro Abs: 12.4 10*3/uL — ABNORMAL HIGH (ref 1.7–7.7)
Neutrophils Relative %: 89 %
Platelets: 277 10*3/uL (ref 150–400)
RBC: 3.55 MIL/uL — ABNORMAL LOW (ref 3.87–5.11)
RDW: 13.2 % (ref 11.5–15.5)
WBC: 13.9 10*3/uL — ABNORMAL HIGH (ref 4.0–10.5)
nRBC: 0 % (ref 0.0–0.2)
nRBC: 0 /100{WBCs}

## 2023-05-23 LAB — BASIC METABOLIC PANEL
Anion gap: 10 (ref 5–15)
BUN: 24 mg/dL — ABNORMAL HIGH (ref 6–20)
CO2: 17 mmol/L — ABNORMAL LOW (ref 22–32)
Calcium: 7.6 mg/dL — ABNORMAL LOW (ref 8.9–10.3)
Chloride: 108 mmol/L (ref 98–111)
Creatinine, Ser: 2.41 mg/dL — ABNORMAL HIGH (ref 0.44–1.00)
GFR, Estimated: 26 mL/min — ABNORMAL LOW (ref >60)
Glucose, Bld: 90 mg/dL (ref 70–99)
Potassium: 3.4 mmol/L — ABNORMAL LOW (ref 3.5–5.1)
Sodium: 135 mmol/L (ref 135–145)

## 2023-05-23 LAB — MAGNESIUM: Magnesium: 1.4 mg/dL — ABNORMAL LOW (ref 1.7–2.4)

## 2023-05-23 LAB — URINE CULTURE: Culture: <10000 — AB

## 2023-05-23 LAB — PROCALCITONIN: Procalcitonin: 3.33 ng/mL

## 2023-05-23 LAB — C-REACTIVE PROTEIN: CRP: 23.9 mg/dL — ABNORMAL HIGH (ref <1.0)

## 2023-05-23 MED ORDER — POTASSIUM CHLORIDE CRYS ER 20 MEQ PO TBCR
40.0000 meq | EXTENDED_RELEASE_TABLET | Freq: Once | ORAL | Status: AC
  Administered 2023-05-23: 40 meq via ORAL
  Filled 2023-05-23: qty 2

## 2023-05-23 MED ORDER — OXYCODONE HCL 5 MG PO TABS
5.0000 mg | ORAL_TABLET | ORAL | Status: DC | PRN
  Administered 2023-05-23 – 2023-05-26 (×10): 5 mg via ORAL
  Filled 2023-05-23 (×11): qty 1

## 2023-05-23 MED ORDER — LACTATED RINGERS IV SOLN: INTRAVENOUS | Status: AC

## 2023-05-23 MED ORDER — LOPERAMIDE HCL 2 MG PO CAPS
4.0000 mg | ORAL_CAPSULE | Freq: Once | ORAL | Status: AC
  Administered 2023-05-23: 4 mg via ORAL
  Filled 2023-05-23: qty 2

## 2023-05-23 MED ORDER — MAGNESIUM SULFATE 4 GM/100ML IV SOLN
4.0000 g | Freq: Once | INTRAVENOUS | Status: AC
  Administered 2023-05-23: 4 g via INTRAVENOUS
  Filled 2023-05-23: qty 100

## 2023-05-23 MED ORDER — ACETAMINOPHEN 500 MG PO TABS
500.0000 mg | ORAL_TABLET | Freq: Once | ORAL | Status: AC
  Administered 2023-05-23: 500 mg via ORAL
  Filled 2023-05-23: qty 1

## 2023-05-23 MED ORDER — ACETAMINOPHEN 325 MG PO TABS
650.0000 mg | ORAL_TABLET | Freq: Once | ORAL | Status: DC
Start: 2023-05-23 — End: 2023-05-23

## 2023-05-23 NOTE — Plan of Care (Signed)
  Problem: Education: Goal: Knowledge of General Education information will improve Description: Including pain rating scale, medication(s)/side effects and non-pharmacologic comfort measures Outcome: Progressing   Problem: Health Behavior/Discharge Planning: Goal: Ability to manage health-related needs will improve Outcome: Progressing   Problem: Clinical Measurements: Goal: Ability to maintain clinical measurements within normal limits will improve Outcome: Progressing Goal: Will remain free from infection Outcome: Progressing Goal: Diagnostic test results will improve Outcome: Progressing   Problem: Coping: Goal: Level of anxiety will decrease Outcome: Progressing   Problem: Pain Management: Goal: General experience of comfort will improve Outcome: Progressing   Problem: Safety: Goal: Ability to remain free from injury will improve Outcome: Progressing

## 2023-05-23 NOTE — Progress Notes (Addendum)
Patient is alert, awake ambulatory . Pt' s heart rate NSR and  has  periodic ST 101-109. MD aware. LFA has mild bruising from IV insertion. PT had fever 101.3 MD notified , was given one dose tylenol 500 mg.

## 2023-05-23 NOTE — Progress Notes (Addendum)
PROGRESS NOTE                                                                                                                                                                                                             Patient Demographics:    Christy Whitehead, is a 37 y.o. female, DOB - Oct 01, 1985, VQQ:[id number]  Outpatient Primary MD for the patient is Marjie Skiff, NP    LOS - 2  Admit date - 05/21/2023    Chief Complaint  Patient presents with   Abdominal Pain   Nausea       Brief Narrative (HPI from H&P)   37 y.o. female with medical history significant of Hashimoto's thyroiditis, depression, anxiety presents with complaints of abdominal pain and nausea.  Symptoms started about 1 week ago.  The patient reported feeling unwell while out with friends, experiencing feverish symptoms. The patient initially dismissed these symptoms as a potential COVID-19 infection, but a home test was negative. Accompanying the fever were body aches, specifically in the lower back and neck. The patient also reported persistent dizziness and severe nausea, which significantly reduced her appetite and fluid intake.  Her further workup in the ER showed sepsis due to right-sided pyelonephritis.   Subjective:   Patient in bed appears to be in no distress, no headache or chest pain, still having fevers at night, mild improvement in right-sided flank pain and some upper generalized abdominal pain - No Nausea,     Assessment  & Plan :    Sepsis secondary to right-sided pyelonephritis With E. coli bacteremia, she had been sick for about 6 to 7 days prior to hospital visit, developed AKI due to sepsis as well, on appropriate antibiotics, IV fluids for hydration, sepsis pathophysiology has improved, renal function improving.  Continue to monitor clinically.  CT scan reassuring without any obstruction.  Clinically much improved follow final  culture results, continue hydration and supportive care, expect her to run fevers for another 4 to 5 days.   Acute renal failure As #1 above   Normocytic anemia Hemoglobin 11.4.  Patient denies any blood in her stools.  Last hemoglobin noted to be 13.3 on 04/17/2023. -Continue to monitor H&H, PCP to monitor outpatient   Hypothyroidism -Continue levothyroxine, stable TSH   Migraine headaches -Continue Topamax   Hypomagnesemia-hypokalemia.  Replace.  Overweight BMI 25.35 kg/m.  Patient is on Zepbound in outpatient setting.      Condition - Fair  Family Communication  : Family member bedside on the recliner on 05/22/2023  Code Status :  Full  Consults  :  None  PUD Prophylaxis :    Procedures  :     CT - 1. Findings consistent with right-sided pyelonephritis. 2. Hepatosplenomegaly. Fatty infiltration of the liver.      Disposition Plan  :    Status is: Inpatient  DVT Prophylaxis  :    heparin injection 5,000 Units Start: 05/21/23 1607   Lab Results  Component Value Date   PLT 277 05/23/2023    Diet :  Diet Order             Diet regular Room service appropriate? Yes; Fluid consistency: Thin  Diet effective now                    Inpatient Medications  Scheduled Meds:  Atogepant  60 mg Oral Daily   fluticasone  1-2 spray Each Nare Daily   heparin  5,000 Units Subcutaneous Q8H   levothyroxine  125 mcg Oral Daily   sodium chloride flush  3 mL Intravenous Q12H   topiramate  100 mg Oral BID   Continuous Infusions:  cefTRIAXone (ROCEPHIN)  IV 2 g (05/23/23 0813)   lactated ringers 50 mL/hr at 05/23/23 0631   PRN Meds:.acetaminophen **OR** acetaminophen, albuterol, loperamide, morphine injection, ondansetron **OR** ondansetron (ZOFRAN) IV, oxyCODONE, oxymetazoline, SUMAtriptan  Antibiotics  :    Anti-infectives (From admission, onward)    Start     Dose/Rate Route Frequency Ordered Stop   05/22/23 1000  cefTRIAXone (ROCEPHIN) 2 g in  sodium chloride 0.9 % 100 mL IVPB        2 g 200 mL/hr over 30 Minutes Intravenous Every 24 hours 05/21/23 1506     05/21/23 1215  ceFEPIme (MAXIPIME) 2 g in sodium chloride 0.9 % 100 mL IVPB        2 g 200 mL/hr over 30 Minutes Intravenous  Once 05/21/23 1203 05/21/23 1429   05/21/23 1215  metroNIDAZOLE (FLAGYL) IVPB 500 mg        500 mg 100 mL/hr over 60 Minutes Intravenous  Once 05/21/23 1203 05/21/23 1433   05/21/23 1215  vancomycin (VANCOCIN) IVPB 1000 mg/200 mL premix  Status:  Discontinued        1,000 mg 200 mL/hr over 60 Minutes Intravenous  Once 05/21/23 1203 05/21/23 1203   05/21/23 1215  vancomycin (VANCOREADY) IVPB 1250 mg/250 mL        1,250 mg 166.7 mL/hr over 90 Minutes Intravenous  Once 05/21/23 1204 05/21/23 1514         Objective:   Vitals:   05/23/23 0615 05/23/23 0616 05/23/23 0818 05/23/23 0822  BP:    112/75  Pulse: (!) 114 (!) 105 89 90  Resp: (!) 21 (!) 21  20  Temp:  99.5 F (37.5 C)  98.6 F (37 C)  TempSrc:    Oral  SpO2:   92% 90%  Weight:      Height:        Wt Readings from Last 3 Encounters:  05/21/23 67 kg  04/17/23 67 kg  01/29/23 67 kg     Intake/Output Summary (Last 24 hours) at 05/23/2023 1118 Last data filed at 05/22/2023 1400 Gross per 24 hour  Intake 330.77 ml  Output --  Net 330.77 ml  Physical Exam  Awake Alert, No new F.N deficits, Normal affect [place].AT,PERRAL Supple Neck, No JVD,   Symmetrical Chest wall movement, Good air movement bilaterally, CTAB RRR,No Gallops,Rubs or new Murmurs,  +ve B.Sounds, Abd Soft, No tenderness,  +ve R flank tenderness No Cyanosis, Clubbing or edema      Data Review:    Recent Labs  Lab 05/21/23 1035 05/21/23 1243 05/22/23 0524 05/23/23 0352  WBC 32.9* 33.5* 26.1* 13.9*  HGB 11.4* 11.4* 10.6* 10.2*  HCT 33.9* 35.1* 32.8* 30.7*  PLT 289 290 251 277  MCV 86.5 88.6 88.6 86.5  MCH 29.1 28.8 28.6 28.7  MCHC 33.6 32.5 32.3 33.2  RDW 13.1 13.0 13.2 13.2  LYMPHSABS 0.7 0.5*   --  1.0  MONOABS 1.3* 1.5*  --  0.6  EOSABS 0.0 0.0  --  0.0  BASOSABS 0.0 0.1  --  0.0    Recent Labs  Lab 05/21/23 1035 05/21/23 1243 05/21/23 1250 05/21/23 1642 05/21/23 2021 05/22/23 0524 05/22/23 0814 05/23/23 0352  NA 131* 133*  --   --  132* 135  --  135  K 3.6 3.6  --   --  3.3* 3.3*  --  3.4*  CL 102 104  --   --  106 106  --  108  CO2 14* 13*  --   --  15* 15*  --  17*  ANIONGAP 15 16*  --   --  11 14  --  10  GLUCOSE 89 90  --   --  106* 83  --  90  BUN 41* 37*  --   --  34* 33*  --  24*  CREATININE 4.34* 4.53*  --   --  3.58* 3.06*  --  2.41*  AST 24 24  --   --   --   --   --   --   ALT 22 21  --   --   --   --   --   --   ALKPHOS 104 126  --   --   --   --   --   --   BILITOT 0.5 0.7  --   --   --   --   --   --   ALBUMIN 2.6* 2.3*  --   --   --   --   --   --   CRP  --   --   --   --   --   --  26.4* 23.9*  PROCALCITON  --   --   --   --   --  5.49  --  3.33  LATICACIDVEN  --   --  1.6  --   --   --   --   --   INR  --  1.3*  --   --   --   --   --   --   TSH  --   --   --  4.067  --   --   --   --   MG  --   --   --   --   --   --   --  1.4*  CALCIUM 7.6* 7.7*  --   --  7.1* 6.9*  --  7.6*      Recent Labs  Lab 05/21/23 1035 05/21/23 1243 05/21/23 1250 05/21/23 1642 05/21/23 2021 05/22/23 0524 05/22/23 0814 05/23/23 0352  CRP  --   --   --   --   --   --  26.4* 23.9*  PROCALCITON  --   --   --   --   --  5.49  --  3.33  LATICACIDVEN  --   --  1.6  --   --   --   --   --   INR  --  1.3*  --   --   --   --   --   --   TSH  --   --   --  4.067  --   --   --   --   MG  --   --   --   --   --   --   --  1.4*  CALCIUM 7.6* 7.7*  --   --  7.1* 6.9*  --  7.6*    --------------------------------------------------------------------------------------------------------------- Lab Results  Component Value Date   CHOL 191 04/17/2023   HDL 70 04/17/2023   LDLCALC 93 04/17/2023   TRIG 166 (H) 04/17/2023   CHOLHDL 3 09/03/2021    No results found  for: "HGBA1C" Recent Labs    05/21/23 1642  TSH 4.067      Micro Results Recent Results (from the past 240 hours)  Resp panel by RT-PCR (RSV, Flu A&B, Covid) Anterior Nasal Swab     Status: None   Collection Time: 05/21/23 12:02 PM   Specimen: Anterior Nasal Swab  Result Value Ref Range Status   SARS Coronavirus 2 by RT PCR NEGATIVE NEGATIVE Final   Influenza A by PCR NEGATIVE NEGATIVE Final   Influenza B by PCR NEGATIVE NEGATIVE Final    Comment: (NOTE) The Xpert Xpress SARS-CoV-2/FLU/RSV plus assay is intended as an aid in the diagnosis of influenza from Nasopharyngeal swab specimens and should not be used as a sole basis for treatment. Nasal washings and aspirates are unacceptable for Xpert Xpress SARS-CoV-2/FLU/RSV testing.  Fact Sheet for Patients: BloggerCourse.com  Fact Sheet for Healthcare Providers: SeriousBroker.it  This test is not yet approved or cleared by the [place] FDA and has been authorized for detection and/or diagnosis of SARS-CoV-2 by FDA under an Emergency Use Authorization (EUA). This EUA will remain in effect (meaning this test can be used) for the duration of the COVID-19 declaration under Section 564(b)(1) of the Act, 21 U.S.C. section 360bbb-3(b)(1), unless the authorization is terminated or revoked.     Resp Syncytial Virus by PCR NEGATIVE NEGATIVE Final    Comment: (NOTE) Fact Sheet for Patients: BloggerCourse.com  Fact Sheet for Healthcare Providers: SeriousBroker.it  This test is not yet approved or cleared by the [place] FDA and has been authorized for detection and/or diagnosis of SARS-CoV-2 by FDA under an Emergency Use Authorization (EUA). This EUA will remain in effect (meaning this test can be used) for the duration of the COVID-19 declaration under Section 564(b)(1) of the Act, 21 U.S.C. section 360bbb-3(b)(1), unless  the authorization is terminated or revoked.  Performed at [facility], [address] [place], [place] [address]   Blood Culture (routine x 2)     Status: None (Preliminary result)   Collection Time: 05/21/23 12:02 PM   Specimen: BLOOD  Result Value Ref Range Status   Specimen Description BLOOD RIGHT ANTECUBITAL  Final   Special Requests   Final    BOTTLES DRAWN AEROBIC AND ANAEROBIC Blood Culture results may not be optimal due to an inadequate volume of blood received in culture bottles   Culture  Setup Time   Final    GRAM NEGATIVE RODS  AEROBIC BOTTLE ONLY CRITICAL RESULT CALLED TO, READ BACK BY AND VERIFIED WITH: [place] Nickel [id number] AT 0822 BY EC Performed at [facility], [address] [place], [place] [address]    Culture GRAM NEGATIVE RODS  Final   Report Status PENDING  Incomplete  Blood Culture ID Panel (Reflexed)     Status: Abnormal   Collection Time: 05/21/23 12:02 PM  Result Value Ref Range Status   Enterococcus faecalis NOT DETECTED NOT DETECTED Final   Enterococcus Faecium NOT DETECTED NOT DETECTED Final   Listeria monocytogenes NOT DETECTED NOT DETECTED Final   Staphylococcus species NOT DETECTED NOT DETECTED Final   Staphylococcus aureus (BCID) NOT DETECTED NOT DETECTED Final   Staphylococcus epidermidis NOT DETECTED NOT DETECTED Final   Staphylococcus lugdunensis NOT DETECTED NOT DETECTED Final   Streptococcus species NOT DETECTED NOT DETECTED Final   Streptococcus agalactiae NOT DETECTED NOT DETECTED Final   Streptococcus pneumoniae NOT DETECTED NOT DETECTED Final   Streptococcus pyogenes NOT DETECTED NOT DETECTED Final   A.calcoaceticus-baumannii NOT DETECTED NOT DETECTED Final   Bacteroides fragilis NOT DETECTED NOT DETECTED Final   Enterobacterales DETECTED (A) NOT DETECTED Final    Comment: Enterobacterales represent a large order of gram negative bacteria, not a single organism. CRITICAL RESULT CALLED TO, READ BACK BY AND VERIFIED  WITH: PHARMD BAILEY AGEE [phone] AT 0822 BY EC    Enterobacter cloacae complex NOT DETECTED NOT DETECTED Final   Escherichia coli DETECTED (A) NOT DETECTED Final    Comment: CRITICAL RESULT CALLED TO, READ BACK BY AND VERIFIED WITH: PHARMD BAILEY AGEE [id number] AT 0822 BY EC    Klebsiella aerogenes NOT DETECTED NOT DETECTED Final   Klebsiella oxytoca NOT DETECTED NOT DETECTED Final   Klebsiella pneumoniae NOT DETECTED NOT DETECTED Final   Proteus species NOT DETECTED NOT DETECTED Final   Salmonella species NOT DETECTED NOT DETECTED Final   Serratia marcescens NOT DETECTED NOT DETECTED Final   Haemophilus influenzae NOT DETECTED NOT DETECTED Final   Neisseria meningitidis NOT DETECTED NOT DETECTED Final   Pseudomonas aeruginosa NOT DETECTED NOT DETECTED Final   Stenotrophomonas maltophilia NOT DETECTED NOT DETECTED Final   Candida albicans NOT DETECTED NOT DETECTED Final   Candida auris NOT DETECTED NOT DETECTED Final   Candida glabrata NOT DETECTED NOT DETECTED Final   Candida krusei NOT DETECTED NOT DETECTED Final   Candida parapsilosis NOT DETECTED NOT DETECTED Final   Candida tropicalis NOT DETECTED NOT DETECTED Final   Cryptococcus neoformans/gattii NOT DETECTED NOT DETECTED Final   CTX-M ESBL NOT DETECTED NOT DETECTED Final   Carbapenem resistance IMP NOT DETECTED NOT DETECTED Final   Carbapenem resistance KPC NOT DETECTED NOT DETECTED Final   Carbapenem resistance NDM NOT DETECTED NOT DETECTED Final   Carbapenem resist OXA 48 LIKE NOT DETECTED NOT DETECTED Final   Carbapenem resistance VIM NOT DETECTED NOT DETECTED Final    Comment: Performed at [facility], [address], [place], [place] [address]  Blood Culture (routine x 2)     Status: None (Preliminary result)   Collection Time: 05/21/23 12:07 PM   Specimen: BLOOD LEFT ARM  Result Value Ref Range Status   Specimen Description BLOOD LEFT ARM  Final   Special Requests   Final    BOTTLES DRAWN AEROBIC AND  ANAEROBIC Blood Culture adequate volume   Culture   Final    NO GROWTH 2 DAYS Performed at [facility], [address] [place], [place] [address]  Report Status PENDING  Incomplete    Radiology Reports CT ABDOMEN PELVIS WO CONTRAST Result Date: 05/21/2023 CLINICAL DATA:  Abdominal pain. EXAM: CT ABDOMEN AND PELVIS WITHOUT CONTRAST TECHNIQUE: Multidetector CT imaging of the abdomen and pelvis was performed following the standard protocol without IV contrast. RADIATION DOSE REDUCTION: This exam was performed according to the departmental dose-optimization program which includes automated exposure control, adjustment of the mA and/or kV according to patient size and/or use of iterative reconstruction technique. COMPARISON:  None Available. FINDINGS: Lower chest: Dependent subsegmental atelectasis. No pericardial or pleural effusion. Hepatobiliary: Enlarged liver with decreased attenuation consistent with hepatic fatty infiltration. Status post cholecystectomy. Pancreas: Unremarkable. No pancreatic ductal dilatation or surrounding inflammatory changes. Spleen: Splenomegaly, 13 x 12 cm Adrenals/Urinary Tract: No adrenal lesions. Right-sided hydronephrosis and perinephric stranding consistent with pyelonephritis without evidence for nephrolithiasis. Unremarkable urinary bladder. Stomach/Bowel: Stomach is within normal limits. Appendix appears normal. No evidence of bowel wall thickening, distention, or inflammatory changes. Vascular/Lymphatic: No significant vascular findings are present. No enlarged abdominal or pelvic lymph nodes. Reproductive: Uterus and bilateral adnexa are unremarkable. Other: No abdominal wall hernia or abnormality. No abdominopelvic ascites. Musculoskeletal: No acute or significant osseous findings. IMPRESSION: 1. Findings consistent with right-sided pyelonephritis. 2. Hepatosplenomegaly. Fatty infiltration of the liver. Electronically Signed   By: Layla Maw M.D.   On:  05/21/2023 14:00   DG Chest Port 1 View Result Date: 05/21/2023 CLINICAL DATA:  Possible sepsis. EXAM: PORTABLE CHEST 1 VIEW COMPARISON:  None Available. FINDINGS: The heart size and mediastinal contours are within normal limits. Both lungs are clear. The visualized skeletal structures are unremarkable. IMPRESSION: No active disease. Electronically Signed   By: Lupita Raider M.D.   On: 05/21/2023 12:34      Signature  -   Susa Raring M.D on 05/23/2023 at 11:18 AM   -  To page go to [email or web address]

## 2023-05-24 DIAGNOSIS — N12 Tubulo-interstitial nephritis, not specified as acute or chronic: Secondary | ICD-10-CM | POA: Diagnosis not present

## 2023-05-24 LAB — CBC WITH DIFFERENTIAL/PLATELET
Abs Immature Granulocytes: 1.45 10*3/uL — ABNORMAL HIGH (ref 0.00–0.07)
Basophils Absolute: 0.1 10*3/uL (ref 0.0–0.1)
Basophils Relative: 1 %
Eosinophils Absolute: 0.3 10*3/uL (ref 0.0–0.5)
Eosinophils Relative: 2 %
HCT: 30.3 % — ABNORMAL LOW (ref 36.0–46.0)
Hemoglobin: 10.1 g/dL — ABNORMAL LOW (ref 12.0–15.0)
Immature Granulocytes: 10 %
Lymphocytes Relative: 14 %
Lymphs Abs: 2.1 10*3/uL (ref 0.7–4.0)
MCH: 28.6 pg (ref 26.0–34.0)
MCHC: 33.3 g/dL (ref 30.0–36.0)
MCV: 85.8 fL (ref 80.0–100.0)
Monocytes Absolute: 1.4 10*3/uL — ABNORMAL HIGH (ref 0.1–1.0)
Monocytes Relative: 9 %
Neutro Abs: 10.1 10*3/uL — ABNORMAL HIGH (ref 1.7–7.7)
Neutrophils Relative %: 64 %
Platelets: 253 10*3/uL (ref 150–400)
RBC: 3.53 MIL/uL — ABNORMAL LOW (ref 3.87–5.11)
RDW: 13.4 % (ref 11.5–15.5)
WBC: 15.3 10*3/uL — ABNORMAL HIGH (ref 4.0–10.5)
nRBC: 0 % (ref 0.0–0.2)

## 2023-05-24 LAB — BASIC METABOLIC PANEL
Anion gap: 6 (ref 5–15)
BUN: 19 mg/dL (ref 6–20)
CO2: 18 mmol/L — ABNORMAL LOW (ref 22–32)
Calcium: 7.6 mg/dL — ABNORMAL LOW (ref 8.9–10.3)
Chloride: 107 mmol/L (ref 98–111)
Creatinine, Ser: 1.74 mg/dL — ABNORMAL HIGH (ref 0.44–1.00)
GFR, Estimated: 38 mL/min — ABNORMAL LOW (ref >60)
Glucose, Bld: 132 mg/dL — ABNORMAL HIGH (ref 70–99)
Potassium: 3.2 mmol/L — ABNORMAL LOW (ref 3.5–5.1)
Sodium: 131 mmol/L — ABNORMAL LOW (ref 135–145)

## 2023-05-24 LAB — C-REACTIVE PROTEIN: CRP: 12.9 mg/dL — ABNORMAL HIGH (ref <1.0)

## 2023-05-24 LAB — PROCALCITONIN: Procalcitonin: 1.51 ng/mL

## 2023-05-24 LAB — MAGNESIUM: Magnesium: 2 mg/dL (ref 1.7–2.4)

## 2023-05-24 MED ORDER — POTASSIUM CHLORIDE CRYS ER 20 MEQ PO TBCR
40.0000 meq | EXTENDED_RELEASE_TABLET | Freq: Once | ORAL | Status: AC
  Administered 2023-05-24: 40 meq via ORAL
  Filled 2023-05-24: qty 2

## 2023-05-24 NOTE — Progress Notes (Addendum)
[facility] PROGRESS NOTE  Christy Whitehead (DOB: Oct 04, 1985) ZOX:[id number] PCP: Marjie Skiff, NP  Brief Narrative: Christy Whitehead is a 37 y.o. female with a history of Hashimoto's thyroiditis, depression/anxiety, overweight on zepbound who presented to the ED on 05/21/2023 with abdominal pain, fever, nausea and dysuria worsening over the previous week. She had taken ibuprofen frequently for related body aches. She was tachycardic, tachypneic with leukocytosis (WBC 33.5k) and AKI (SCr 4.53 with acidosis (bicarb 13) and CT consistent with right pyelonephritis. She was given sepsis protocol IV fluids and broad IV antibiotics. Blood cultures drawn on admission have grown E. coli. Renal function has improved.   Subjective: Feels incessantly thirsty. Abdominal discomfort improving, still with right flank pain and loose stools. Febrile overnight. Significant other at bedside.   Objective: BP 114/68 (BP Location: Left Arm)   Pulse 88   Temp 99.3 F (37.4 C) ([place])   Resp 20   Ht 5\' 4"  (1.626 m)   Wt 67 kg   SpO2 96%   BMI 25.35 kg/m   Gen: No distress Pulm: Clear, nonlabored  CV: RRR, no MRG, no JVD. 1+ pitting dependent symmetric LE edema. GI: Soft, NT, ND, +BS  Neuro: Alert and oriented. No new focal deficits. Ext: Warm, no deformities. Skin: No rashes, lesions or ulcers on visualized skin   Assessment & Plan: Severe sepsis due to acute right pyelonephritis:  - Continue ceftriaxone 2g IV q24h - Follow up culture susceptibilities for tailoring antibiotics.  - Leukocytosis responding, though remains febrile.   AKI: SCr 4.53 from baseline of 0.82. Due to sepsis and dehydration. Improved with IVF and treatment of pyelonephritis.  - Appears to be having LE edema after large volume resuscitation, tolerating po so will not continue IVF. Suspect albuminuria contributing acutely as well. Will monitor renal parameters daily, check albumin in AM, pursue further work  up if needed.   Hypokalemia:  - Repeat supplementation and monitor  Normocytic anemia: Stable in 10's.  - Consider work up outpatient if not improving after acute episode.  Hypothyroidism: TSH 4.067.  - Continue synthroid  Overweight: Body mass index is 25.35 kg/m. On zepbound  Migraine: Continue home topiramate, atogepant.  Tyrone Nine, MD [facility] [email or web address] 05/24/2023, 11:57 AM

## 2023-05-24 NOTE — Plan of Care (Signed)

## 2023-05-25 ENCOUNTER — Other Ambulatory Visit (HOSPITAL_BASED_OUTPATIENT_CLINIC_OR_DEPARTMENT_OTHER): Payer: Self-pay

## 2023-05-25 DIAGNOSIS — N12 Tubulo-interstitial nephritis, not specified as acute or chronic: Secondary | ICD-10-CM | POA: Diagnosis not present

## 2023-05-25 LAB — COMPREHENSIVE METABOLIC PANEL
ALT: 14 U/L (ref 0–44)
AST: 12 U/L — ABNORMAL LOW (ref 15–41)
Albumin: 1.7 g/dL — ABNORMAL LOW (ref 3.5–5.0)
Alkaline Phosphatase: 102 U/L (ref 38–126)
Anion gap: 10 (ref 5–15)
BUN: 19 mg/dL (ref 6–20)
CO2: 15 mmol/L — ABNORMAL LOW (ref 22–32)
Calcium: 8.1 mg/dL — ABNORMAL LOW (ref 8.9–10.3)
Chloride: 111 mmol/L (ref 98–111)
Creatinine, Ser: 1.28 mg/dL — ABNORMAL HIGH (ref 0.44–1.00)
GFR, Estimated: 55 mL/min — ABNORMAL LOW (ref >60)
Glucose, Bld: 93 mg/dL (ref 70–99)
Potassium: 3.8 mmol/L (ref 3.5–5.1)
Sodium: 136 mmol/L (ref 135–145)
Total Bilirubin: 0.3 mg/dL (ref <1.2)
Total Protein: 4.7 g/dL — ABNORMAL LOW (ref 6.5–8.1)

## 2023-05-25 LAB — CBC WITH DIFFERENTIAL/PLATELET
Abs Immature Granulocytes: 1.7 10*3/uL — ABNORMAL HIGH (ref 0.00–0.07)
Basophils Absolute: 0.1 10*3/uL (ref 0.0–0.1)
Basophils Relative: 1 %
Eosinophils Absolute: 0.2 10*3/uL (ref 0.0–0.5)
Eosinophils Relative: 1 %
HCT: 32.7 % — ABNORMAL LOW (ref 36.0–46.0)
Hemoglobin: 10.8 g/dL — ABNORMAL LOW (ref 12.0–15.0)
Immature Granulocytes: 10 %
Lymphocytes Relative: 14 %
Lymphs Abs: 2.4 10*3/uL (ref 0.7–4.0)
MCH: 28.7 pg (ref 26.0–34.0)
MCHC: 33 g/dL (ref 30.0–36.0)
MCV: 87 fL (ref 80.0–100.0)
Monocytes Absolute: 1.6 10*3/uL — ABNORMAL HIGH (ref 0.1–1.0)
Monocytes Relative: 9 %
Neutro Abs: 10.8 10*3/uL — ABNORMAL HIGH (ref 1.7–7.7)
Neutrophils Relative %: 65 %
Platelets: 291 10*3/uL (ref 150–400)
RBC: 3.76 MIL/uL — ABNORMAL LOW (ref 3.87–5.11)
RDW: 13.2 % (ref 11.5–15.5)
Smear Review: NORMAL
WBC: 16.8 10*3/uL — ABNORMAL HIGH (ref 4.0–10.5)
nRBC: 0 % (ref 0.0–0.2)

## 2023-05-25 LAB — CULTURE, BLOOD (ROUTINE X 2)

## 2023-05-25 MED ORDER — CEFAZOLIN SODIUM-DEXTROSE 2-4 GM/100ML-% IV SOLN
2.0000 g | Freq: Three times a day (TID) | INTRAVENOUS | Status: DC
  Administered 2023-05-25 – 2023-05-26 (×4): 2 g via INTRAVENOUS
  Filled 2023-05-25 (×4): qty 100

## 2023-05-25 NOTE — Progress Notes (Signed)
Pharmacy Antibiotic Note  Christy Whitehead is a 37 y.o. female admitted on 05/21/2023 with bacteremia.  Pharmacy has been consulted for cefazolin dosing.  Pt with pyelo/bacteremia with pan sensitive e.coli. Optimize abx to cefazolin while inpt.   CrCl>25ml/min  Plan: Cefazolin 2g IV q8  Height: 5\' 4"  (162.6 cm) Weight: 67 kg (147 lb 11.3 oz) IBW/kg (Calculated) : 54.7  Temp (24hrs), Avg:98.2 F (36.8 C), Min:98.1 F (36.7 C), Max:98.4 F (36.9 C)  Recent Labs  Lab 05/21/23 1243 05/21/23 1250 05/21/23 2021 05/22/23 0524 05/23/23 0352 05/24/23 0357 05/25/23 0421  WBC 33.5*  --   --  26.1* 13.9* 15.3* 16.8*  CREATININE 4.53*  --  3.58* 3.06* 2.41* 1.74* 1.28*  LATICACIDVEN  --  1.6  --   --   --   --   --     Estimated Creatinine Clearance: 56.6 mL/min (A) (by C-G formula based on SCr of 1.28 mg/dL (H)).    Allergies  Allergen Reactions   Aimovig [Erenumab-Aooe] Hives    Site reaction   Emgality [Galcanezumab-Gnlm] Hives    Site reaction    Antimicrobials this admission: Cefepime x1 12/26 Metronidazole x1 12/26 Vancomycin x1 12/26 CTX 12/27 >>12/29 Cefazolin 12/30>>  Dose adjustments this admission:   Microbiology results:  12/26 BCx: 2/4 E. Coli (pan sensitive)    Ulyses Southward, PharmD, BCIDP, AAHIVP, CPP Infectious Disease Pharmacist 05/25/2023 12:34 PM

## 2023-05-25 NOTE — Plan of Care (Signed)

## 2023-05-25 NOTE — Progress Notes (Signed)
[facility] PROGRESS NOTE  Christy Whitehead (DOB: 22-Jun-1985) ZOX:[id number] PCP: Marjie Skiff, NP  Brief Narrative: Christy Whitehead is a 37 y.o. female with a history of Hashimoto's thyroiditis, depression/anxiety, overweight on zepbound who presented to the ED on 05/21/2023 with abdominal pain, fever, nausea and dysuria worsening over the previous week. She had taken ibuprofen frequently for related body aches. She was tachycardic, tachypneic with leukocytosis (WBC 33.5k) and AKI (SCr 4.53 with acidosis (bicarb 13) and CT consistent with right pyelonephritis. She was given sepsis protocol IV fluids and broad IV antibiotics. Blood cultures drawn on admission have grown E. coli. Renal function has improved.   Subjective: Still thirsty, drinking well but still not eating much. Urine output appears normal. Eager to go home. Had fever overnight that responded to tylenol before temperature was checked.  Objective: BP 116/80 (BP Location: Left Arm)   Pulse 72   Temp 98.3 F (36.8 C) ([place])   Resp 20   Ht 5\' 4"  (1.626 m)   Wt 67 kg   SpO2 98%   BMI 25.35 kg/m   Gen: No distress Pulm: Clear, nonlabored  CV: RRR, no MRG, no JVD, diffuse nonpitting edema involving legs, abdomen GI: Soft, NT, ND, +BS  Neuro: Alert and oriented. No new focal deficits. Ext: Warm, no deformities Skin: No rashes, lesions or ulcers on visualized skin   Assessment & Plan: Severe sepsis due to acute right pyelonephritis:  - Continue IV antibiotics. Still feeling febrile with persistent leukocytosis, though pan-sensitivity on culture. Tailor abx down to ancef. Once sustaining improvement, transition to po and discharge, possibly 12/31. Recheck CBC, inflammatory markers in AM. - NSR on monitor throughout admission, to facilitate mobility, DC telemetry. Pt also freely mobile, so can DC heparin VTE ppx (has been declining this, and is an [employer] fund[employer] so aware of indication)  AKI: SCr 4.53 from baseline  of 0.82. Due to sepsis, dehydration, NSAID. Improved with IVF and treatment of pyelonephritis.  - Appears to be having LE edema after large volume resuscitation, tolerating po so will not continuing IVF. Suspect albuminuria contributing acutely as well. Supplement protein orally, albumin down to 1.7 today.  - No further IVF needed - Holding NSAIDs.   Hypokalemia:  - Repeat supplementation and monitor  Normocytic anemia: Stable in 10's.  - Consider work up outpatient if not improving after acute episode.  Hypothyroidism: TSH 4.067.  - Continue synthroid  Overweight: Body mass index is 25.35 kg/m. On zepbound. If hypoalbuminemia is a concern going forward, may need to deescalate this.   Migraine: Continue home topiramate, atogepant.  Tyrone Nine, MD [facility] [email or web address] 05/25/2023, 9:30 AM

## 2023-05-26 ENCOUNTER — Other Ambulatory Visit: Payer: Self-pay

## 2023-05-26 DIAGNOSIS — N12 Tubulo-interstitial nephritis, not specified as acute or chronic: Secondary | ICD-10-CM | POA: Diagnosis not present

## 2023-05-26 LAB — CBC WITH DIFFERENTIAL/PLATELET
Abs Immature Granulocytes: 1.41 10*3/uL — ABNORMAL HIGH (ref 0.00–0.07)
Basophils Absolute: 0.1 10*3/uL (ref 0.0–0.1)
Basophils Relative: 1 %
Eosinophils Absolute: 0.2 10*3/uL (ref 0.0–0.5)
Eosinophils Relative: 2 %
HCT: 32.1 % — ABNORMAL LOW (ref 36.0–46.0)
Hemoglobin: 10.7 g/dL — ABNORMAL LOW (ref 12.0–15.0)
Immature Granulocytes: 9 %
Lymphocytes Relative: 19 %
Lymphs Abs: 2.8 10*3/uL (ref 0.7–4.0)
MCH: 28.6 pg (ref 26.0–34.0)
MCHC: 33.3 g/dL (ref 30.0–36.0)
MCV: 85.8 fL (ref 80.0–100.0)
Monocytes Absolute: 1.3 10*3/uL — ABNORMAL HIGH (ref 0.1–1.0)
Monocytes Relative: 9 %
Neutro Abs: 9.1 10*3/uL — ABNORMAL HIGH (ref 1.7–7.7)
Neutrophils Relative %: 60 %
Platelets: 286 10*3/uL (ref 150–400)
RBC: 3.74 MIL/uL — ABNORMAL LOW (ref 3.87–5.11)
RDW: 13.1 % (ref 11.5–15.5)
Smear Review: ADEQUATE
WBC: 15 10*3/uL — ABNORMAL HIGH (ref 4.0–10.5)
nRBC: 0 % (ref 0.0–0.2)

## 2023-05-26 LAB — RENAL FUNCTION PANEL
Albumin: 1.6 g/dL — ABNORMAL LOW (ref 3.5–5.0)
Anion gap: 8 (ref 5–15)
BUN: 20 mg/dL (ref 6–20)
CO2: 16 mmol/L — ABNORMAL LOW (ref 22–32)
Calcium: 8 mg/dL — ABNORMAL LOW (ref 8.9–10.3)
Chloride: 111 mmol/L (ref 98–111)
Creatinine, Ser: 1.15 mg/dL — ABNORMAL HIGH (ref 0.44–1.00)
GFR, Estimated: >60 mL/min (ref >60)
Glucose, Bld: 100 mg/dL — ABNORMAL HIGH (ref 70–99)
Phosphorus: 3.7 mg/dL (ref 2.5–4.6)
Potassium: 3.5 mmol/L (ref 3.5–5.1)
Sodium: 135 mmol/L (ref 135–145)

## 2023-05-26 LAB — CULTURE, BLOOD (ROUTINE X 2)
Culture: NO GROWTH
Special Requests: ADEQUATE

## 2023-05-26 LAB — C-REACTIVE PROTEIN: CRP: 5.9 mg/dL — ABNORMAL HIGH (ref <1.0)

## 2023-05-26 LAB — PROCALCITONIN: Procalcitonin: 0.71 ng/mL

## 2023-05-26 MED ORDER — OXYCODONE HCL 5 MG PO TABS
5.0000 mg | ORAL_TABLET | Freq: Four times a day (QID) | ORAL | 0 refills | Status: DC | PRN
  Filled 2023-05-26: qty 10, 3d supply, fill #0

## 2023-05-26 MED ORDER — FLUCONAZOLE 100 MG PO TABS
200.0000 mg | ORAL_TABLET | Freq: Once | ORAL | Status: AC
  Administered 2023-05-26: 200 mg via ORAL
  Filled 2023-05-26: qty 2

## 2023-05-26 MED ORDER — FLUCONAZOLE 100 MG PO TABS
100.0000 mg | ORAL_TABLET | Freq: Every day | ORAL | 0 refills | Status: AC
  Filled 2023-05-26: qty 6, 6d supply, fill #0

## 2023-05-26 MED ORDER — CEFADROXIL 500 MG PO CAPS
1000.0000 mg | ORAL_CAPSULE | Freq: Two times a day (BID) | ORAL | 0 refills | Status: AC
  Filled 2023-05-26: qty 20, 5d supply, fill #0

## 2023-05-26 NOTE — TOC Transition Note (Signed)
 Transition of Care [facility]) - Discharge Note   Patient Details  Name: Christy Whitehead MRN: [id number] Date of Birth: Oct 06, 1985  [facility] of [facility]) CM/SW Contact:  Hendricks KANDICE Her, RN Phone Number: 05/26/2023, 9:34 AM   Clinical Narrative:     Patient to DC to home today No TOC needs identified          Patient Goals and CMS Choice            Discharge Placement                       Discharge Plan and Services Additional resources added to the After Visit Summary for                                       Social Drivers of Health (SDOH) Interventions SDOH Screenings   Food Insecurity: No Food Insecurity (05/21/2023)  Housing: Low Risk  (05/21/2023)  Transportation Needs: No Transportation Needs (05/21/2023)  Utilities: Not At Risk (05/21/2023)  Alcohol Screen: Low Risk  (03/06/2022)  Depression (PHQ2-9): Low Risk  (04/17/2023)  Recent Concern: Depression (PHQ2-9) - Medium Risk (01/29/2023)  Financial Resource Strain: Low Risk  (03/06/2022)  Physical Activity: Sufficiently Active (03/06/2022)  Social Connections: Moderately Isolated (03/06/2022)  Stress: Stress Concern Present (03/06/2022)  Tobacco Use: Medium Risk (05/21/2023)     Readmission Risk Interventions     No data to display

## 2023-05-26 NOTE — Discharge Summary (Addendum)
 Physician Discharge Summary   Patient: Christy Whitehead MRN: [id number] DOB: Apr 10, 1986  Admit date:     05/21/2023  Discharge date: 05/26/23  Discharge Physician: Bernardino KATHEE Come   PCP: Valerio Melanie DASEN, NP   Recommendations at discharge:  Follow up with PCP as scheduled 05/29/2023 with recommended recheck CBC and BMP, albumin. Consider further work up of leukocytosis if persistent.   Discharge Diagnoses: Principal Problem:   Pyelonephritis Active Problems:   Sepsis (HCC)   Acute renal failure (HCC)   Normocytic anemia   Hypothyroidism   History of migraine headaches   BMI 25.0-25.9,adult  Hospital Course: Christy Whitehead is a 37 y.o. female with a history of Hashimoto's thyroiditis, depression/anxiety, overweight on zepbound  who presented to the ED on 05/21/2023 with abdominal pain, fever, nausea and dysuria worsening over the previous week. She had taken ibuprofen  frequently for related body aches. She was tachycardic, tachypneic with leukocytosis (WBC 33.5k) and AKI (SCr 4.53 with acidosis (bicarb 13) and CT consistent with right pyelonephritis. She was given sepsis protocol IV fluids and broad IV antibiotics. Blood cultures drawn on admission have grown E. coli. Renal function has improved.   Assessment and Plan: Severe sepsis due to acute right pyelonephritis:  - Has durably defervesced with culture-tailored antibiotics. Will change to cefadroxil  to complete 10 days Tx. WBC still elevated but neutrophil count is declining, immature granulocyte count declining as well, other inflammatory markers continue steady decline as well. Can recheck CBC at follow up.     AKI: SCr 4.53 from baseline of 0.82. Due to sepsis, dehydration, NSAID. Improved with IVF and treatment of pyelonephritis. Cr on day of discharge is 1.1 and urine output is increasing.  - Avoid nephrotoxins including NSAIDs after discharge.   Peripheral edema: Clear lungs, no JVD, suspect hypoalbuminemia from  AKI-induced proteinuria and IVF resuscitation. Appears to be autodiuresing.  - Attention at follow up and further work up if persistent.    Hypokalemia: Resolved.     Normocytic anemia: Stable in 10's.  - Consider work up outpatient if not improving after acute episode.   Hypothyroidism: TSH 4.067.  - Continue synthroid    Overweight: Body mass index is 25.35 kg/m. On zepbound . If hypoalbuminemia is a concern going forward, may need to deescalate this.    Migraine: Continue home topiramate , atogepant .  Oropharyngeal candidiasis: Suspected based on symptoms and appearance in setting of abx.  - Give diflucan  200mg  now and 100mg  daily after discharge.   Splenomegaly: Unclear significance. Etiology suspected to be infectious.  - Attention at follow up  Consultants: None Procedures performed: None  Disposition: Home Diet recommendation: Regular DISCHARGE MEDICATION: Allergies as of 05/26/2023       Reactions   Aimovig  [erenumab -aooe] Hives   Site reaction   Emgality  [galcanezumab -gnlm] Hives   Site reaction        Medication List     TAKE these medications    acetaminophen  500 MG tablet Commonly known as: TYLENOL  Take 500 mg by mouth every 6 (six) hours as needed.   Biotin 2500 MCG Chew Chew 1 tablet by mouth daily.   cefadroxil  500 MG capsule Commonly known as: DURICEF Take 2 capsules (1,000 mg total) by mouth 2 (two) times daily for 5 days.   Cholecalciferol  25 MCG (1000 UT) capsule Take 2,000 Units by mouth daily.   cyanocobalamin  1000 MCG tablet Commonly known as: VITAMIN B12 Take 1,000 mcg by mouth daily.   etonogestrel -ethinyl estradiol  0.12-0.015 MG/24HR vaginal ring Commonly known as: NUVARING INSERT  1 RING VAGINALLY AND LEAVE IN PLACE FOR 3 CONSECUTIVE WEEKS, THEN REPLACE (FOR CONTINUOUS DOSING DUE TO HEADACHES) (INSERT VAGINALLY AND LEAVE IN PLACE FOR 3 CONSECUTIVE WEEKS, THEN REPLACE FOR CONTINUOUS DOSING DUE TO HEADACHES)   EXCEDRIN PO Take by  mouth as needed.   fluconazole  100 MG tablet Commonly known as: Diflucan  Take 1 tablet (100 mg total) by mouth daily for 6 days. Start taking on: May 27, 2023   fluticasone  50 MCG/ACT nasal spray Commonly known as: FLONASE  Place 1-2 sprays into both nostrils daily.   levothyroxine  125 MCG tablet Commonly known as: SYNTHROID  Take 1 tablet (125 mcg total) by mouth daily.   magnesium  gluconate 500 MG tablet Commonly known as: MAGONATE Take 1,000 mg by mouth daily.   ondansetron  4 MG disintegrating tablet Commonly known as: ZOFRAN -ODT Take 1 tablet (4 mg total) by mouth every 8 (eight) hours as needed for nausea or vomiting.   oxyCODONE  5 MG immediate release tablet Commonly known as: Oxy IR/ROXICODONE  Take 1 tablet (5 mg total) by mouth every 6 (six) hours as needed for moderate pain (pain score 4-6) or severe pain (pain score 7-10).   oxymetazoline  0.05 % nasal spray Commonly known as: AFRIN Place 1 spray into both nostrils 2 (two) times daily as needed for congestion.   Qulipta  60 MG Tabs Generic drug: Atogepant  Take 1 tablet (60 mg) by mouth daily.   scopolamine  1 MG/3DAYS Commonly known as: TRANSDERM-SCOP Place 1 patch (1.5 mg total) onto the skin every 3 (three) days.   SUMAtriptan  100 MG tablet Commonly known as: IMITREX  Take 1 tablet (100 mg total) by mouth once as needed for up to 1 dose for migraine. May repeat in 2 hours if headache persists or recurs.   topiramate  100 MG tablet Commonly known as: TOPAMAX  Take 1 tablet (100 mg total) by mouth 2 (two) times daily. (Take 1 tablet (100 mg total) by mouth 2 (two) times daily. Please keep upcoming appt in March for continued refills.)   Unifine Pentips 31G X 5 MM Misc Generic drug: Insulin  Pen Needle Use with Saxenda    Zepbound  10 MG/0.5ML Pen Generic drug: tirzepatide  Inject 10 mg into the skin once a week. What changed: additional instructions        Follow-up Information     Valerio Melanie DASEN, NP  Follow up on 05/29/2023.   Specialty: Nurse Practitioner Why: at 3:20pm Contact information: [address] [place] [address] [phone]                Discharge Exam: Christy Whitehead   05/21/23 1210  Weight: 67 kg  BP 110/66   Pulse 81   Temp 97.8 F (36.6 C) (Oral)   Resp 16   Ht 5' 4 (1.626 m)   Wt 67 kg   SpO2 97%   BMI 25.35 kg/m   Well-appearing female in no distress Clear, nonlabored Diffuse trace-to-1+ pitting edema  Condition at discharge: stable  The results of significant diagnostics from this hospitalization (including imaging, microbiology, ancillary and laboratory) are listed below for reference.   Imaging Studies: CT ABDOMEN PELVIS WO CONTRAST Result Date: 05/21/2023 CLINICAL DATA:  Abdominal pain. EXAM: CT ABDOMEN AND PELVIS WITHOUT CONTRAST TECHNIQUE: Multidetector CT imaging of the abdomen and pelvis was performed following the standard protocol without IV contrast. RADIATION DOSE REDUCTION: This exam was performed according to the departmental dose-optimization program which includes automated exposure control, adjustment of the mA and/or kV according to patient size and/or use of iterative  reconstruction technique. COMPARISON:  None Available. FINDINGS: Lower chest: Dependent subsegmental atelectasis. No pericardial or pleural effusion. Hepatobiliary: Enlarged liver with decreased attenuation consistent with hepatic fatty infiltration. Status post cholecystectomy. Pancreas: Unremarkable. No pancreatic ductal dilatation or surrounding inflammatory changes. Spleen: Splenomegaly, 13 x 12 cm Adrenals/Urinary Tract: No adrenal lesions. Right-sided hydronephrosis and perinephric stranding consistent with pyelonephritis without evidence for nephrolithiasis. Unremarkable urinary bladder. Stomach/Bowel: Stomach is within normal limits. Appendix appears normal. No evidence of bowel wall thickening, distention, or inflammatory changes. Vascular/Lymphatic: No  significant vascular findings are present. No enlarged abdominal or pelvic lymph nodes. Reproductive: Uterus and bilateral adnexa are unremarkable. Other: No abdominal wall hernia or abnormality. No abdominopelvic ascites. Musculoskeletal: No acute or significant osseous findings. IMPRESSION: 1. Findings consistent with right-sided pyelonephritis. 2. Hepatosplenomegaly. Fatty infiltration of the liver. Electronically Signed   By: Fonda Field M.D.   On: 05/21/2023 14:00   DG Chest Port 1 View Result Date: 05/21/2023 CLINICAL DATA:  Possible sepsis. EXAM: PORTABLE CHEST 1 VIEW COMPARISON:  None Available. FINDINGS: The heart size and mediastinal contours are within normal limits. Both lungs are clear. The visualized skeletal structures are unremarkable. IMPRESSION: No active disease. Electronically Signed   By: Lynwood Landy Raddle M.D.   On: 05/21/2023 12:34    Microbiology: Results for orders placed or performed during the hospital encounter of 05/21/23  Resp panel by RT-PCR (RSV, Flu A&B, Covid) Anterior Nasal Swab     Status: None   Collection Time: 05/21/23 12:02 PM   Specimen: Anterior Nasal Swab  Result Value Ref Range Status   SARS Coronavirus 2 by RT PCR NEGATIVE NEGATIVE Final   Influenza A by PCR NEGATIVE NEGATIVE Final   Influenza B by PCR NEGATIVE NEGATIVE Final    Comment: (NOTE) The Xpert Xpress SARS-CoV-2/FLU/RSV plus assay is intended as an aid in the diagnosis of influenza from Nasopharyngeal swab specimens and should not be used as a sole basis for treatment. Nasal washings and aspirates are unacceptable for Xpert Xpress SARS-CoV-2/FLU/RSV testing.  Fact Sheet for Patients: bloggercourse.com  Fact Sheet for Healthcare Providers: seriousbroker.it  This test is not yet approved or cleared by the [place]  FDA and has been authorized for detection and/or diagnosis of SARS-CoV-2 by FDA under an Emergency Use  Authorization (EUA). This EUA will remain in effect (meaning this test can be used) for the duration of the COVID-19 declaration under Section 564(b)(1) of the Act, 21 U.S.C. section 360bbb-3(b)(1), unless the authorization is terminated or revoked.     Resp Syncytial Virus by PCR NEGATIVE NEGATIVE Final    Comment: (NOTE) Fact Sheet for Patients: bloggercourse.com  Fact Sheet for Healthcare Providers: seriousbroker.it  This test is not yet approved or cleared by the [place]  FDA and has been authorized for detection and/or diagnosis of SARS-CoV-2 by FDA under an Emergency Use Authorization (EUA). This EUA will remain in effect (meaning this test can be used) for the duration of the COVID-19 declaration under Section 564(b)(1) of the Act, 21 U.S.C. section 360bbb-3(b)(1), unless the authorization is terminated or revoked.  Performed at [facility], [address], [place], [place] [address]   Blood Culture (routine x 2)     Status: Abnormal   Collection Time: 05/21/23 12:02 PM   Specimen: BLOOD  Result Value Ref Range Status   Specimen Description BLOOD RIGHT ANTECUBITAL  Final   Special Requests   Final    BOTTLES DRAWN AEROBIC AND ANAEROBIC Blood Culture results may not be optimal  due to an inadequate volume of blood received in culture bottles   Culture  Setup Time   Final    GRAM NEGATIVE RODS IN BOTH AEROBIC AND ANAEROBIC BOTTLES CRITICAL RESULT CALLED TO, READ BACK BY AND VERIFIED WITH: MAYA CON LAUGHTER [phone] AT [id number] BY EC Performed at [facility], [address] [place], [place] [address]    Culture ESCHERICHIA COLI (A)  Final   Report Status 05/25/2023 FINAL  Final   Organism ID, Bacteria ESCHERICHIA COLI  Final   Organism ID, Bacteria ESCHERICHIA COLI  Final      Susceptibility   Escherichia coli - MIC*    AMPICILLIN <=2 SENSITIVE Sensitive     CEFEPIME  <=0.12 SENSITIVE Sensitive      CEFTAZIDIME <=1 SENSITIVE Sensitive     CEFTRIAXONE  <=0.25 SENSITIVE Sensitive     CIPROFLOXACIN <=0.25 SENSITIVE Sensitive     GENTAMICIN <=1 SENSITIVE Sensitive     IMIPENEM <=0.25 SENSITIVE Sensitive     TRIMETH/SULFA <=20 SENSITIVE Sensitive     AMPICILLIN/SULBACTAM <=2 SENSITIVE Sensitive     PIP/TAZO <=4 SENSITIVE Sensitive ug/mL   Escherichia coli - KIRBY BAUER*    CEFAZOLIN  SENSITIVE Sensitive     * ESCHERICHIA COLI    ESCHERICHIA COLI  Blood Culture ID Panel (Reflexed)     Status: Abnormal   Collection Time: 05/21/23 12:02 PM  Result Value Ref Range Status   Enterococcus faecalis NOT DETECTED NOT DETECTED Final   Enterococcus Faecium NOT DETECTED NOT DETECTED Final   Listeria monocytogenes NOT DETECTED NOT DETECTED Final   Staphylococcus species NOT DETECTED NOT DETECTED Final   Staphylococcus aureus (BCID) NOT DETECTED NOT DETECTED Final   Staphylococcus epidermidis NOT DETECTED NOT DETECTED Final   Staphylococcus lugdunensis NOT DETECTED NOT DETECTED Final   Streptococcus species NOT DETECTED NOT DETECTED Final   Streptococcus agalactiae NOT DETECTED NOT DETECTED Final   Streptococcus pneumoniae NOT DETECTED NOT DETECTED Final   Streptococcus pyogenes NOT DETECTED NOT DETECTED Final   A.calcoaceticus-baumannii NOT DETECTED NOT DETECTED Final   Bacteroides fragilis NOT DETECTED NOT DETECTED Final   Enterobacterales DETECTED (A) NOT DETECTED Final    Comment: Enterobacterales represent a large order of gram negative bacteria, not a single organism. CRITICAL RESULT CALLED TO, READ BACK BY AND VERIFIED WITH: PHARMD BAILEY AGEE [phone] AT 0822 BY EC    Enterobacter cloacae complex NOT DETECTED NOT DETECTED Final   Escherichia coli DETECTED (A) NOT DETECTED Final    Comment: CRITICAL RESULT CALLED TO, READ BACK BY AND VERIFIED WITH: PHARMD BAILEY AGEE [phone] AT 0822 BY EC    Klebsiella aerogenes NOT DETECTED NOT DETECTED Final   Klebsiella oxytoca NOT DETECTED NOT  DETECTED Final   Klebsiella pneumoniae NOT DETECTED NOT DETECTED Final   Proteus species NOT DETECTED NOT DETECTED Final   Salmonella species NOT DETECTED NOT DETECTED Final   Serratia marcescens NOT DETECTED NOT DETECTED Final   Haemophilus influenzae NOT DETECTED NOT DETECTED Final   Neisseria meningitidis NOT DETECTED NOT DETECTED Final   Pseudomonas aeruginosa NOT DETECTED NOT DETECTED Final   Stenotrophomonas maltophilia NOT DETECTED NOT DETECTED Final   Candida albicans NOT DETECTED NOT DETECTED Final   Candida auris NOT DETECTED NOT DETECTED Final   Candida glabrata NOT DETECTED NOT DETECTED Final   Candida krusei NOT DETECTED NOT DETECTED Final   Candida parapsilosis NOT DETECTED NOT DETECTED Final   Candida tropicalis NOT DETECTED NOT DETECTED Final   Cryptococcus neoformans/gattii NOT DETECTED NOT DETECTED Final  CTX-M ESBL NOT DETECTED NOT DETECTED Final   Carbapenem resistance IMP NOT DETECTED NOT DETECTED Final   Carbapenem resistance KPC NOT DETECTED NOT DETECTED Final   Carbapenem resistance NDM NOT DETECTED NOT DETECTED Final   Carbapenem resist OXA 48 LIKE NOT DETECTED NOT DETECTED Final   Carbapenem resistance VIM NOT DETECTED NOT DETECTED Final    Comment: Performed at [facility], [address] [place], [place] [address]  Blood Culture (routine x 2)     Status: None (Preliminary result)   Collection Time: 05/21/23 12:07 PM   Specimen: BLOOD LEFT ARM  Result Value Ref Range Status   Specimen Description BLOOD LEFT ARM  Final   Special Requests   Final    BOTTLES DRAWN AEROBIC AND ANAEROBIC Blood Culture adequate volume   Culture   Final    NO GROWTH 4 DAYS Performed at [facility], [address] [place], [place] [address]    Report Status PENDING  Incomplete  Urine Culture (for pregnant, neutropenic or urologic patients or patients with an indwelling urinary catheter)     Status: Abnormal   Collection Time: 05/21/23  2:09 PM   Specimen:  Urine, Clean Catch  Result Value Ref Range Status   Specimen Description URINE, CLEAN CATCH  Final   Special Requests NONE  Final   Culture (A)  Final    <10,000 COLONIES/mL INSIGNIFICANT GROWTH Performed at [facility], [address], [place], [place] [address]    Report Status 05/23/2023 FINAL  Final    Labs: CBC: Recent Labs  Lab 05/21/23 1243 05/22/23 0524 05/23/23 0352 05/24/23 0357 05/25/23 0421 05/26/23 0432  WBC 33.5* 26.1* 13.9* 15.3* 16.8* 15.0*  NEUTROABS 28.1*  --  12.4* 10.1* 10.8* 9.1*  HGB 11.4* 10.6* 10.2* 10.1* 10.8* 10.7*  HCT 35.1* 32.8* 30.7* 30.3* 32.7* 32.1*  MCV 88.6 88.6 86.5 85.8 87.0 85.8  PLT 290 251 277 253 291 286   Basic Metabolic Panel: Recent Labs  Lab 05/22/23 0524 05/23/23 0352 05/24/23 0357 05/25/23 0421 05/26/23 0432  NA 135 135 131* 136 135  K 3.3* 3.4* 3.2* 3.8 3.5  CL 106 108 107 111 111  CO2 15* 17* 18* 15* 16*  GLUCOSE 83 90 132* 93 100*  BUN 33* 24* 19 19 20   CREATININE 3.06* 2.41* 1.74* 1.28* 1.15*  CALCIUM 6.9* 7.6* 7.6* 8.1* 8.0*  MG  --  1.4* 2.0  --   --   PHOS  --   --   --   --  3.7   Liver Function Tests: Recent Labs  Lab 05/21/23 1035 05/21/23 1243 05/25/23 0421 05/26/23 0432  AST 24 24 12*  --   ALT 22 21 14   --   ALKPHOS 104 126 102  --   BILITOT 0.5 0.7 0.3  --   PROT 6.4* 5.9* 4.7*  --   ALBUMIN 2.6* 2.3* 1.7* 1.6*   CBG: No results for input(s): GLUCAP in the last 168 hours.  Discharge time spent: greater than 30 minutes.  Signed: Bernardino KATHEE Come, MD [facility] 05/26/2023

## 2023-05-26 NOTE — Plan of Care (Signed)

## 2023-05-26 NOTE — Progress Notes (Signed)
 Patient reported new onset dysuria this morning, and nausea related to antibiotics. Will relay to day team. Will continue with ordered plan of care at this time.

## 2023-05-26 NOTE — Progress Notes (Signed)
Discharge instructions given. Patients verbalized understanding and all questions were given.

## 2023-05-29 ENCOUNTER — Ambulatory Visit: Payer: 59 | Admitting: Nurse Practitioner

## 2023-06-03 ENCOUNTER — Ambulatory Visit: Payer: 59 | Admitting: Pediatrics

## 2023-06-03 ENCOUNTER — Encounter: Payer: Self-pay | Admitting: Pediatrics

## 2023-06-03 VITALS — BP 100/64 | HR 71 | Temp 98.6°F | Resp 15 | Ht 64.0 in | Wt 148.8 lb

## 2023-06-03 DIAGNOSIS — Z1330 Encounter for screening examination for mental health and behavioral disorders, unspecified: Secondary | ICD-10-CM

## 2023-06-03 DIAGNOSIS — Z09 Encounter for follow-up examination after completed treatment for conditions other than malignant neoplasm: Secondary | ICD-10-CM | POA: Diagnosis not present

## 2023-06-03 DIAGNOSIS — Z87448 Personal history of other diseases of urinary system: Secondary | ICD-10-CM

## 2023-06-03 DIAGNOSIS — N179 Acute kidney failure, unspecified: Secondary | ICD-10-CM | POA: Diagnosis not present

## 2023-06-03 LAB — URINALYSIS, ROUTINE W REFLEX MICROSCOPIC
Bilirubin, UA: NEGATIVE
Glucose, UA: NEGATIVE
Ketones, UA: NEGATIVE
Nitrite, UA: NEGATIVE
Protein,UA: NEGATIVE
RBC, UA: NEGATIVE
Specific Gravity, UA: 1.02 (ref 1.005–1.030)
Urobilinogen, Ur: 0.2 mg/dL (ref 0.2–1.0)
pH, UA: 6 (ref 5.0–7.5)

## 2023-06-03 LAB — MICROSCOPIC EXAMINATION: Bacteria, UA: NONE SEEN

## 2023-06-03 NOTE — Progress Notes (Signed)
 Office Visit  BP 100/64 (BP Location: Left Arm, Patient Position: Sitting, Cuff Size: Normal)   Pulse 71   Temp 98.6 F (37 C) (Oral)   Resp 15   Ht 5' 4 (1.626 m)   Wt 148 lb 12.8 oz (67.5 kg)   LMP 03/13/2023 (Exact Date)   SpO2 99%   BMI 25.54 kg/m    Subjective:    Patient ID: Christy Whitehead, female    DOB: 1986/01/05, 38 y.o.   MRN: [id number]  HPI: Christy Whitehead is a 38 y.o. female  Chief Complaint  Patient presents with   Hospitalization Follow-up    Much better however feels urine is still dark. Doesn't have any burning or other symptoms.    Summary of Hospitalization Dates of Hospitalization : 12/26 until 12/31 Chief reason for hospitalization : pyelonephritis  Secondary problems :  Sepsis (HCC)   Acute renal failure (HCC)   Normocytic anemia   Hypothyroidism   History of migraine headaches   BMI 25.0-25.9,adult Follow up recommendations : CBC, BMP  Brief summary : Christy Whitehead is a 38 y.o. female with a history of Hashimoto's thyroiditis, depression/anxiety, overweight on zepbound  who presented to the ED on 05/21/2023 with abdominal pain, fever, nausea and dysuria worsening over the previous week. She had taken ibuprofen  frequently for related body aches. She was tachycardic, tachypneic with leukocytosis (WBC 33.5k) and AKI (SCr 4.53 with acidosis (bicarb 13) and CT consistent with right pyelonephritis. She was given sepsis protocol IV fluids and broad IV antibiotics. Blood cultures drawn on admission have grown E. coli. Renal function has improved.  For further details, please see D/C summary dated : 05/21/2023  Interval History Today, the patient reports below  Discussed the use of AI scribe software for clinical note transcription with the patient, who gave verbal consent to proceed.  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  She reports noticing a change in urine color to a darker, amber hue, but denies any associated pain or burning sensation. No blood has been observed in the urine. The patient has been recovering from the infection, which was described as 'gnarly' and required hospitalization.  The patient's back pain, presumably related to the kidney infection, has improved and is currently managed with Tylenol . She has not used Voltaren gel (diclofenac), but is aware of it. The patient experienced nausea and abdominal pain upon returning home from the hospital, but these symptoms have since resolved.  The patient also reports an episode of thrush, presumably secondary to antibiotic therapy, which caused significant oral discomfort. This was managed with a six-day course of Diflucan .  The patient experienced significant fluid retention during her hospital stay, gaining fifteen pounds in a week and experiencing shortness of breath and edema. She reports feeling 'like a dialysis patient.' Since discharge, she has been urinating frequently and has lost the excess fluid weight.  The patient also mentions a history of constant eye twitching, for which she was scheduled to see an eye doctor. This appointment was cancelled due to her recent illness.  The patient is currently feeling much better and is ready to return to work. She denies any current pain, fevers, chills, nausea, or vomiting.      Relevant past medical, surgical, family and social history reviewed and updated as indicated. Interim medical history since our last visit reviewed. Allergies and medications reviewed and updated.  ROS per HPI  unless specifically indicated above     Objective:    BP 100/64 (BP Location: Left Arm, Patient Position: Sitting, Cuff Size: Normal)   Pulse 71   Temp 98.6 F (37 C) (Oral)   Resp 15   Ht 5' 4 (1.626 m)   Wt 148 lb 12.8 oz (67.5 kg)   LMP 03/13/2023 (Exact Date)   SpO2 99%   BMI 25.54 kg/m   Wt Readings from  Last 3 Encounters:  06/03/23 148 lb 12.8 oz (67.5 kg)  05/21/23 147 lb 11.3 oz (67 kg)  04/17/23 147 lb 12.8 oz (67 kg)     Physical Exam Constitutional:      Appearance: Normal appearance.  HENT:     Head: Normocephalic and atraumatic.  Eyes:     Pupils: Pupils are equal, round, and reactive to light.  Cardiovascular:     Rate and Rhythm: Normal rate and regular rhythm.     Pulses: Normal pulses.     Heart sounds: Normal heart sounds.  Pulmonary:     Effort: Pulmonary effort is normal.     Breath sounds: Normal breath sounds.  Abdominal:     General: Abdomen is flat.     Palpations: Abdomen is soft.     Tenderness: There is no abdominal tenderness. There is no right CVA tenderness, left CVA tenderness, guarding or rebound.  Musculoskeletal:        General: Normal range of motion.     Cervical back: Normal range of motion.  Skin:    General: Skin is warm and dry.     Capillary Refill: Capillary refill takes less than 2 seconds.  Neurological:     General: No focal deficit present.     Mental Status: She is alert. Mental status is at baseline.  Psychiatric:        Mood and Affect: Mood normal.        Behavior: Behavior normal.         06/03/2023    1:20 PM 04/17/2023    9:15 AM 01/29/2023    8:37 AM 07/29/2022    9:58 AM 06/17/2022    2:32 PM  Depression screen PHQ 2/9  Decreased Interest 1 1 1  0 1  Down, Depressed, Hopeless 0 0 1 0 0  PHQ - 2 Score 1 1 2  0 1  Altered sleeping 2 1 2 1 2   Tired, decreased energy 2 1 2 1 1   Change in appetite 1 0 1 1 0  Feeling bad or failure about yourself  0 0 0 0 0  Trouble concentrating 0 0 1 0 0  Moving slowly or fidgety/restless 0 0 0 0 0  Suicidal thoughts 0 0 0 0 0  PHQ-9 Score 6 3 8 3 4   Difficult doing work/chores Not difficult at all Not difficult at all Somewhat difficult Not difficult at all Somewhat difficult       06/03/2023    1:20 PM 04/17/2023    9:15 AM 01/29/2023    8:38 AM 07/29/2022    9:58 AM  GAD 7 :  Generalized Anxiety Score  Nervous, Anxious, on Edge 0 1 1 0  Control/stop worrying 0 1 1 0  Worry too much - different things 0 2 1 0  Trouble relaxing 1 1 0 0  Restless 1 2 0 0  Easily annoyed or irritable 2 2 1 1   Afraid - awful might happen 0 2 1 0  Total GAD 7 Score 4 11 5  1  Anxiety Difficulty Somewhat difficult Somewhat difficult Somewhat difficult Not difficult at all       Assessment & Plan:  Assessment & Plan   Hospital discharge follow-up Hospitalization reviewed, medications reviewed. Plan and follow up for acute concerns below.  History of pyelonephritis AKI (acute kidney injury) (HCC) Recovery from severe infection with improvement in back pain and overall condition. Noted dark urine without hematuria or dysuria. Complicated by AKI, will repeat labs below. She is very well appearing and otherwise has recovered well. Exam unremarkable. -Order urinalysis to assess for blood or protein. -Consider use of over-the-counter Voltaren gel for residual back pain. -     CBC -     Urinalysis, Routine w reflex microscopic -     CBC with Differential/Platelet -     Comprehensive metabolic panel  Encounter for behavioral health screening As part of their intake evaluation, the patient was screened for depression, anxiety.  PHQ9 SCORE 6, GAD7 SCORE 4. Screening results negative for tested conditions. Continue to monitor.  Follow up plan: Return if symptoms worsen or fail to improve.  Eda Magnussen SHAUNNA NETT, MD  Approximately 30 minutes spent on patient encounter today including assessment, counseling, diagnosing, treatment plan development, and charting.

## 2023-06-03 NOTE — Patient Instructions (Addendum)
 Labs for today, will let you know if any other work up needed

## 2023-06-04 ENCOUNTER — Other Ambulatory Visit: Payer: Self-pay | Admitting: Pediatrics

## 2023-06-04 DIAGNOSIS — D75839 Thrombocytosis, unspecified: Secondary | ICD-10-CM

## 2023-06-04 LAB — CBC
Hematocrit: 32.4 % — ABNORMAL LOW (ref 34.0–46.6)
Hemoglobin: 11.1 g/dL (ref 11.1–15.9)
MCH: 29.2 pg (ref 26.6–33.0)
MCHC: 34.3 g/dL (ref 31.5–35.7)
MCV: 85 fL (ref 79–97)
Platelets: 1197 10*3/uL (ref 150–450)
RBC: 3.8 x10E6/uL (ref 3.77–5.28)
RDW: 13.4 % (ref 11.7–15.4)
WBC: 11.6 10*3/uL — ABNORMAL HIGH (ref 3.4–10.8)

## 2023-06-04 LAB — CBC WITH DIFFERENTIAL/PLATELET
Basophils Absolute: 0.1 10*3/uL (ref 0.0–0.2)
Basos: 1 %
EOS (ABSOLUTE): 0.1 10*3/uL (ref 0.0–0.4)
Eos: 1 %
Hematocrit: 32.7 % — ABNORMAL LOW (ref 34.0–46.6)
Hemoglobin: 10.7 g/dL — ABNORMAL LOW (ref 11.1–15.9)
Immature Grans (Abs): 0.1 10*3/uL (ref 0.0–0.1)
Immature Granulocytes: 1 %
Lymphocytes Absolute: 2.7 10*3/uL (ref 0.7–3.1)
Lymphs: 24 %
MCH: 28.9 pg (ref 26.6–33.0)
MCHC: 32.7 g/dL (ref 31.5–35.7)
MCV: 88 fL (ref 79–97)
Monocytes Absolute: 0.5 10*3/uL (ref 0.1–0.9)
Monocytes: 5 %
Neutrophils Absolute: 7.6 10*3/uL — ABNORMAL HIGH (ref 1.4–7.0)
Neutrophils: 68 %
Platelets: 1179 10*3/uL (ref 150–450)
RBC: 3.7 x10E6/uL — ABNORMAL LOW (ref 3.77–5.28)
RDW: 12.5 % (ref 11.7–15.4)
WBC: 11.2 10*3/uL — ABNORMAL HIGH (ref 3.4–10.8)

## 2023-06-04 LAB — COMPREHENSIVE METABOLIC PANEL
ALT: 10 [IU]/L (ref 0–32)
AST: 13 [IU]/L (ref 0–40)
Albumin: 3.9 g/dL (ref 3.9–4.9)
Alkaline Phosphatase: 89 [IU]/L (ref 44–121)
BUN/Creatinine Ratio: 22 (ref 9–23)
BUN: 18 mg/dL (ref 6–20)
Bilirubin Total: 0.3 mg/dL (ref 0.0–1.2)
CO2: 18 mmol/L — ABNORMAL LOW (ref 20–29)
Calcium: 9.7 mg/dL (ref 8.7–10.2)
Chloride: 106 mmol/L (ref 96–106)
Creatinine, Ser: 0.82 mg/dL (ref 0.57–1.00)
Globulin, Total: 2.8 g/dL (ref 1.5–4.5)
Glucose: 90 mg/dL (ref 70–99)
Potassium: 4.5 mmol/L (ref 3.5–5.2)
Sodium: 139 mmol/L (ref 134–144)
Total Protein: 6.7 g/dL (ref 6.0–8.5)
eGFR: 94 mL/min/{1.73_m2} (ref >59)

## 2023-06-08 ENCOUNTER — Other Ambulatory Visit: Payer: 59

## 2023-06-08 DIAGNOSIS — D75839 Thrombocytosis, unspecified: Secondary | ICD-10-CM | POA: Diagnosis not present

## 2023-06-09 ENCOUNTER — Other Ambulatory Visit: Payer: Self-pay | Admitting: Pediatrics

## 2023-06-09 DIAGNOSIS — D75839 Thrombocytosis, unspecified: Secondary | ICD-10-CM

## 2023-06-09 LAB — IRON,TIBC AND FERRITIN PANEL
Ferritin: 121 ng/mL (ref 15–150)
Iron Saturation: 8 % — CL (ref 15–55)
Iron: 31 ug/dL (ref 27–159)
Total Iron Binding Capacity: 412 ug/dL (ref 250–450)
UIBC: 381 ug/dL (ref 131–425)

## 2023-06-09 LAB — CBC WITH DIFFERENTIAL/PLATELET
Basophils Absolute: 0.1 10*3/uL (ref 0.0–0.2)
Basos: 2 %
EOS (ABSOLUTE): 0.2 10*3/uL (ref 0.0–0.4)
Eos: 3 %
Hematocrit: 30.6 % — ABNORMAL LOW (ref 34.0–46.6)
Hemoglobin: 9.7 g/dL — ABNORMAL LOW (ref 11.1–15.9)
Immature Grans (Abs): 0 10*3/uL (ref 0.0–0.1)
Immature Granulocytes: 0 %
Lymphocytes Absolute: 2.2 10*3/uL (ref 0.7–3.1)
Lymphs: 32 %
MCH: 28.6 pg (ref 26.6–33.0)
MCHC: 31.7 g/dL (ref 31.5–35.7)
MCV: 90 fL (ref 79–97)
Monocytes Absolute: 0.5 10*3/uL (ref 0.1–0.9)
Monocytes: 7 %
Neutrophils Absolute: 3.9 10*3/uL (ref 1.4–7.0)
Neutrophils: 56 %
Platelets: 701 10*3/uL — ABNORMAL HIGH (ref 150–450)
RBC: 3.39 x10E6/uL — ABNORMAL LOW (ref 3.77–5.28)
RDW: 12.4 % (ref 11.7–15.4)
WBC: 7 10*3/uL (ref 3.4–10.8)

## 2023-06-09 LAB — FOLATE: Folate: 5.6 ng/mL (ref >3.0)

## 2023-06-09 LAB — VITAMIN B12: Vitamin B-12: >2000 pg/mL — ABNORMAL HIGH (ref 232–1245)

## 2023-06-09 NOTE — Progress Notes (Signed)
 Future CBC w diff order in place

## 2023-06-12 ENCOUNTER — Other Ambulatory Visit: Payer: Self-pay

## 2023-07-02 ENCOUNTER — Other Ambulatory Visit (HOSPITAL_COMMUNITY): Payer: Self-pay

## 2023-07-02 ENCOUNTER — Telehealth: Payer: Self-pay | Admitting: Pharmacy Technician

## 2023-07-02 NOTE — Telephone Encounter (Signed)
 Pharmacy Patient Advocate Encounter   Received notification from CoverMyMeds that prior authorization for Qulipta  60MG  tablets is required/requested.   Insurance verification completed.   The patient is insured through [facility] .   Per test claim: PA required; PA submitted to above mentioned insurance via CoverMyMeds Key/confirmation #/EOC [id number] Status is pending

## 2023-07-03 ENCOUNTER — Other Ambulatory Visit (HOSPITAL_COMMUNITY): Payer: Self-pay

## 2023-07-03 NOTE — Telephone Encounter (Signed)
 Pharmacy Patient Advocate Encounter  Received notification from MED[employer] that Prior Authorization for Qulipta  has been APPROVED from 07/02/2023 to 07/01/2024. Ran test claim, Copay is $19.79. This test claim was processed through [facility]- copay amounts may vary at other pharmacies due to pharmacy/plan contracts, or as the patient moves through the different stages of their insurance plan.

## 2023-07-11 ENCOUNTER — Other Ambulatory Visit: Payer: Self-pay

## 2023-07-13 ENCOUNTER — Other Ambulatory Visit: Payer: Self-pay

## 2023-07-28 ENCOUNTER — Other Ambulatory Visit: Payer: Self-pay

## 2023-07-30 ENCOUNTER — Other Ambulatory Visit: Payer: Self-pay

## 2023-07-30 ENCOUNTER — Other Ambulatory Visit: Payer: Self-pay | Admitting: Nurse Practitioner

## 2023-07-31 ENCOUNTER — Other Ambulatory Visit: Payer: Self-pay

## 2023-07-31 MED FILL — Levothyroxine Sodium Tab 125 MCG: ORAL | 60 days supply | Qty: 60 | Fill #0 | Status: AC

## 2023-07-31 NOTE — Telephone Encounter (Signed)
 Requested Prescriptions  Pending Prescriptions Disp Refills   levothyroxine (SYNTHROID) 125 MCG tablet 60 tablet 2    Sig: Take 1 tablet (125 mcg total) by mouth daily.     Endocrinology:  Hypothyroid Agents Passed - 07/31/2023 10:44 AM      Passed - TSH in normal range and within 360 days    TSH  Date Value Ref Range Status  05/21/2023 4.067 0.350 - 4.500 uIU/mL Final    Comment:    Performed by a 3rd Generation assay with a functional sensitivity of <=0.01 uIU/mL. Performed at [facility], [address] [place], [place] [address]   04/17/2023 1.610 0.450 - 4.500 uIU/mL Final         Passed - Valid encounter within last 12 months    Recent Outpatient Visits           1 month ago Hospital discharge follow-up   [facility] [place] Jackolyn Confer, MD   3 months ago Depression, major, single episode, moderate (HCC)   [facility] [place], [place] T, NP   6 months ago Depression, major, single episode, moderate (HCC)   [place] [facility] [place], Corrie Dandy T, NP   1 year ago Hashimoto's thyroiditis   [place] [facility] [place], Corrie Dandy T, NP   1 year ago Hashimoto's thyroiditis   [place] [facility] Gabriel Cirri, NP

## 2023-08-03 ENCOUNTER — Other Ambulatory Visit: Payer: Self-pay

## 2023-08-24 ENCOUNTER — Other Ambulatory Visit: Payer: Self-pay

## 2023-08-24 ENCOUNTER — Other Ambulatory Visit: Payer: Self-pay | Admitting: Obstetrics and Gynecology

## 2023-08-24 DIAGNOSIS — Z3044 Encounter for surveillance of vaginal ring hormonal contraceptive device: Secondary | ICD-10-CM

## 2023-08-26 ENCOUNTER — Other Ambulatory Visit: Payer: Self-pay

## 2023-08-26 DIAGNOSIS — Z3044 Encounter for surveillance of vaginal ring hormonal contraceptive device: Secondary | ICD-10-CM

## 2023-08-26 MED ORDER — ETONOGESTREL-ETHINYL ESTRADIOL 0.12-0.015 MG/24HR VA RING
1.0000 | VAGINAL_RING | VAGINAL | 1 refills | Status: DC
  Filled 2023-08-26: qty 3, 84d supply, fill #0

## 2023-08-26 NOTE — Telephone Encounter (Signed)
 Pt calling triage requesting BC RF. Annual scheduled on 09/24/23 ABC. RF sent, pt aware.

## 2023-09-23 NOTE — Progress Notes (Deleted)
 PCP:  Lemar Pyles, NP   No chief complaint on file.    HPI:      [name] is a 38 y.o. G1P1001 who LMP was No LMP recorded. (Menstrual status: Other)., presents today for her annual examination.  Her menses were infrequent with cont dosing nuvaring for migraine headache prevention, lasting 3 days.  Dysmenorrhea none. She now has random BTB for the past 1-2 months, red blood vs brown d/c; no dysmen. Sx more frequent now, no known triggers.  Hx of migraine without aura for many yrs. On Qulipta  now with sx HA improvement. Did well on aimovig  but had injection site rxn and couldn't continue.   Sex activity: currently sex active with female partner- NuvaRing vaginal inserts. Seen 1/24 for PCB and BV. Treated with flagyl  and doxy. PCB persists. Is bright red blood during sex and continues for 1-2 days. No pain. Neg STD testing 1/24. Last Pap: 06/23/22  Results were: no abnormalities /neg HPV DNA; paps due Q3 yrs for 25 yrs Hx of STDs: HPV--hx of CIN3 with LEEP 2012  There is a FH of breast cancer in her PGM, genetic testing not indicated. There is no FH of ovarian cancer. The patient does occas do self-breast exams.  Tobacco use: The patient denies current or previous tobacco use. Alcohol use: social drinker No drug use.  Exercise: moderately active  She does get adequate calcium and Vitamin D  in her diet.  Labs with PCP  Past Medical History:  Diagnosis Date   Abnormal Pap smear of cervix 12/19/2008, 02/20/2009   ASCUS   Abnormal Pap smear of cervix 08/06/2010, 10/01/2010   HGSIL   Cervical dysplasia CIN1 06/05/2004   CIN III (cervical intraepithelial neoplasia grade III) with severe dysplasia 12/20/2010   LEEP Ecto Cervix   Dysmenorrhea    Migraines     Past Surgical History:  Procedure Laterality Date   CHOLECYSTECTOMY  2015   COLPOSCOPY  06/05/2004,02/20/2009,10/01/2010   LEEP  12/20/2010   ECC &Endo Cx Negative, Ecto CIN 2-3    Family History  Problem  Relation Age of Onset   COPD Mother    Diabetes Mother    Diabetes Father    Hypertension Father    Bipolar disorder Father    Breast cancer Paternal Grandmother 55   Diabetes Sister    Obesity Sister        morbid   Mental retardation Sister    Healthy Daughter    Suicidality Paternal Grandfather     Social History   Socioeconomic History   Marital status: Single    Spouse name: Not on file   Number of children: 1   Years of education: College   Highest education level: Bachelor's degree (e.g., BA, AB, BS)  Occupational History    Comment: RN in ED, night shift  Tobacco Use   Smoking status: Former    Current packs/day: 0.00    Average packs/day: 1 pack/day for 13.0 years (13.0 ttl pk-yrs)    Types: Cigarettes    Start date: 2005    Quit date: 2018    Years since quitting: 7.3   Smokeless tobacco: Never  Vaping Use   Vaping status: Never Used  Substance and Sexual Activity   Alcohol use: Yes    Comment: once every 2 weeks, occasionally >4   Drug use: No   Sexual activity: Yes    Birth control/protection: Inserts    Comment: nuvaring  Other Topics Concern  Not on file  Social History Narrative   Works as an Nutritional therapist at [employer]   One Daughter - Saint Cranker - 48 years old now   Family support: dad is nearby and watches daughter   Enjoys: works a lot, spend time with friends, hiking, corn-hole, beach   Exercise: gym regularly - 1-2 since the gym closed   Diet: pretty healthy overall   Social Drivers of Corporate investment banker Strain: Low Risk  (03/06/2022)   Overall Financial Resource Strain (CARDIA)    Difficulty of Paying Living Expenses: Not hard at all  Food Insecurity: No Food Insecurity (05/21/2023)   Hunger Vital Sign    Worried About Running Out of Food in the Last Year: Never true    Ran Out of Food in the Last Year: Never true  Transportation Needs: No Transportation Needs (05/21/2023)   PRAPARE - Administrator, Civil Service (Medical):  No    Lack of Transportation (Non-Medical): No  Physical Activity: Sufficiently Active (03/06/2022)   Exercise Vital Sign    Days of Exercise per Week: 5 days    Minutes of Exercise per Session: 30 min  Stress: Stress Concern Present (03/06/2022)   [employer] of Occupational Health - Occupational Stress Questionnaire    Feeling of Stress : To some extent  Social Connections: Moderately Isolated (03/06/2022)   Social Connection and Isolation Panel [NHANES]    Frequency of Communication with Friends and Family: More than three times a week    Frequency of Social Gatherings with Friends and Family: More than three times a week    Attends Religious Services: More than 4 times per year    Active Member of [employer] or Organizations: No    Attends Banker Meetings: Never    Marital Status: Separated  Intimate Partner Violence: Not At Risk (05/21/2023)   Humiliation, Afraid, Rape, and Kick questionnaire    Fear of Current or Ex-Partner: No    Emotionally Abused: No    Physically Abused: No    Sexually Abused: No    Outpatient Medications Prior to Visit  Medication Sig Dispense Refill   acetaminophen  (TYLENOL ) 500 MG tablet Take 500 mg by mouth every 6 (six) hours as needed.     Aspirin-Acetaminophen -Caffeine (EXCEDRIN PO) Take by mouth as needed.     Atogepant  (QULIPTA ) 60 MG TABS Take 1 tablet (60 mg) by mouth daily. 90 tablet 3   Biotin 2500 MCG CHEW Chew 1 tablet by mouth daily.     Cholecalciferol  25 MCG (1000 UT) capsule Take 2,000 Units by mouth daily.     cyanocobalamin  (VITAMIN B12) 1000 MCG tablet Take 1,000 mcg by mouth daily.     etonogestrel -ethinyl estradiol  (NUVARING) 0.12-0.015 MG/24HR vaginal ring Insert 1 ring vaginally and leave in place for 3 consecutive weeks, then replace. For continuous dosing due to headaches. 3 each 1   fluticasone  (FLONASE ) 50 MCG/ACT nasal spray Place 1-2 sprays into both nostrils daily.     Insulin  Pen Needle (UNIFINE PENTIPS)  31G X 5 MM MISC Use with Saxenda  100 each 0   levothyroxine  (SYNTHROID ) 125 MCG tablet Take 1 tablet (125 mcg total) by mouth daily. 60 tablet 2   magnesium  gluconate (MAGONATE) 500 MG tablet Take 1,000 mg by mouth daily.     ondansetron  (ZOFRAN -ODT) 4 MG disintegrating tablet Take 1 tablet (4 mg total) by mouth every 8 (eight) hours as needed for nausea or vomiting. 10 tablet 3   oxymetazoline  (AFRIN)  0.05 % nasal spray Place 1 spray into both nostrils 2 (two) times daily as needed for congestion.     SUMAtriptan  (IMITREX ) 100 MG tablet Take 1 tablet (100 mg total) by mouth once as needed for up to 1 dose for migraine. May repeat in 2 hours if headache persists or recurs. 9 tablet 11   tirzepatide  (ZEPBOUND ) 10 MG/0.5ML Pen Inject 10 mg into the skin once a week. (Patient taking differently: Inject 10 mg into the skin once a week. Mondays) 2 mL 12   topiramate  (TOPAMAX ) 100 MG tablet Take 1 tablet (100 mg total) by mouth 2 (two) times daily. Please keep upcoming appt in March for continued refills. 180 tablet 3   No facility-administered medications prior to visit.      ROS:  Review of Systems  Constitutional:  Positive for fatigue. Negative for fever and unexpected weight change.  Respiratory:  Negative for cough, shortness of breath and wheezing.   Cardiovascular:  Negative for chest pain, palpitations and leg swelling.  Gastrointestinal:  Negative for blood in stool, constipation, diarrhea, nausea and vomiting.  Endocrine: Negative for cold intolerance, heat intolerance and polyuria.  Genitourinary:  Positive for vaginal bleeding. Negative for dyspareunia, dysuria, flank pain, frequency, genital sores, hematuria, menstrual problem, pelvic pain, urgency, vaginal discharge and vaginal pain.  Musculoskeletal:  Negative for back pain, joint swelling and myalgias.  Skin:  Negative for rash.  Neurological:  Negative for dizziness, syncope, light-headedness, numbness and headaches.   Hematological:  Negative for adenopathy.  Psychiatric/Behavioral:  Negative for agitation, confusion, sleep disturbance and suicidal ideas. The patient is not nervous/anxious.    BREAST: No symptoms   Objective: There were no vitals taken for this visit.   Physical Exam Constitutional:      Appearance: She is well-developed.  Genitourinary:     Vulva normal.     Genitourinary Comments: NO BLEEDING ON EXAM; PROMINENT TRANSFORMATION ZONE MOST LIKELY DUE TO LEEP     Right Labia: No rash, tenderness or lesions.    Left Labia: No tenderness, lesions or rash.    No vaginal discharge, erythema, tenderness or bleeding.      Right Adnexa: not tender and no mass present.    Left Adnexa: not tender and no mass present.    No cervical motion tenderness, friability or polyp.     Uterus is not enlarged or tender.  Breasts:    Right: No mass, nipple discharge, skin change or tenderness.     Left: No mass, nipple discharge, skin change or tenderness.  Neck:     Thyroid : No thyromegaly.  Cardiovascular:     Rate and Rhythm: Normal rate and regular rhythm.     Heart sounds: Normal heart sounds. No murmur heard. Pulmonary:     Effort: Pulmonary effort is normal.     Breath sounds: Normal breath sounds.  Abdominal:     Palpations: Abdomen is soft.     Tenderness: There is no abdominal tenderness. There is no guarding or rebound.  Musculoskeletal:        General: Normal range of motion.     Cervical back: Normal range of motion.  Lymphadenopathy:     Cervical: No cervical adenopathy.  Neurological:     General: No focal deficit present.     Mental Status: She is alert and oriented to person, place, and time.     Cranial Nerves: No cranial nerve deficit.  Skin:    General: Skin is warm and dry.  Psychiatric:  Mood and Affect: Mood normal.        Behavior: Behavior normal.        Thought Content: Thought content normal.        Judgment: Judgment normal.  Vitals reviewed.      Assessment/Plan: Encounter for annual routine gynecological examination  Encounter for surveillance of vaginal ring hormonal contraceptive device - Plan: etonogestrel -ethinyl estradiol  (NUVARING) 0.12-0.015 MG/24HR vaginal ring; Rx RF.   Breakthrough bleeding with NuvaRing--most likely due to cont dosing nuvaring, never doing placebo wk. Do placebo wk to see if "resets" cycle. F/u prn. If sx cont, will check GYN u/s.   PCB (post coital bleeding)--question related to unstable uterine lining due to cont dosing nuvaring. Neg STD testing 1/24, no change after doxy use. Do placebo wk, then restart nuvaring. If sx persist, will check GYN u/s. If neg and sx persist, will do colpo.     No orders of the defined types were placed in this encounter.            GYN counsel adequate intake of calcium and vitamin D , diet and exercise     F/U  No follow-ups on file.  Jacion Dismore B. Jahsir Rama, PA-C 09/23/2023 3:53 PM

## 2023-09-24 ENCOUNTER — Ambulatory Visit: Payer: Self-pay | Admitting: Obstetrics and Gynecology

## 2023-09-24 DIAGNOSIS — Z3044 Encounter for surveillance of vaginal ring hormonal contraceptive device: Secondary | ICD-10-CM

## 2023-09-24 DIAGNOSIS — N930 Postcoital and contact bleeding: Secondary | ICD-10-CM

## 2023-09-24 DIAGNOSIS — N939 Abnormal uterine and vaginal bleeding, unspecified: Secondary | ICD-10-CM

## 2023-09-24 DIAGNOSIS — Z01419 Encounter for gynecological examination (general) (routine) without abnormal findings: Secondary | ICD-10-CM

## 2023-09-25 ENCOUNTER — Encounter: Payer: Self-pay | Admitting: Obstetrics and Gynecology

## 2023-10-04 MED FILL — Levothyroxine Sodium Tab 125 MCG: ORAL | 60 days supply | Qty: 60 | Fill #1 | Status: CN

## 2023-10-05 ENCOUNTER — Other Ambulatory Visit: Payer: Self-pay

## 2023-10-06 ENCOUNTER — Other Ambulatory Visit: Payer: Self-pay

## 2023-10-07 ENCOUNTER — Other Ambulatory Visit: Payer: Self-pay

## 2023-10-07 ENCOUNTER — Other Ambulatory Visit (HOSPITAL_BASED_OUTPATIENT_CLINIC_OR_DEPARTMENT_OTHER): Payer: Self-pay

## 2023-10-07 MED FILL — Levothyroxine Sodium Tab 125 MCG: ORAL | 30 days supply | Qty: 30 | Fill #1 | Status: AC

## 2023-10-13 ENCOUNTER — Ambulatory Visit (INDEPENDENT_AMBULATORY_CARE_PROVIDER_SITE_OTHER): Payer: Self-pay | Admitting: Obstetrics and Gynecology

## 2023-10-13 ENCOUNTER — Other Ambulatory Visit (HOSPITAL_COMMUNITY)
Admission: RE | Admit: 2023-10-13 | Discharge: 2023-10-13 | Disposition: A | Discharge status: Home / Self Care | Source: Ambulatory Visit | Attending: Obstetrics and Gynecology | Admitting: Obstetrics and Gynecology

## 2023-10-13 ENCOUNTER — Encounter: Payer: Self-pay | Admitting: Obstetrics and Gynecology

## 2023-10-13 ENCOUNTER — Other Ambulatory Visit: Payer: Self-pay

## 2023-10-13 VITALS — BP 108/56 | HR 72 | Ht 64.0 in | Wt 146.0 lb

## 2023-10-13 DIAGNOSIS — Z01419 Encounter for gynecological examination (general) (routine) without abnormal findings: Secondary | ICD-10-CM | POA: Diagnosis not present

## 2023-10-13 DIAGNOSIS — N930 Postcoital and contact bleeding: Secondary | ICD-10-CM | POA: Insufficient documentation

## 2023-10-13 DIAGNOSIS — Z1151 Encounter for screening for human papillomavirus (HPV): Secondary | ICD-10-CM | POA: Insufficient documentation

## 2023-10-13 DIAGNOSIS — Z30011 Encounter for initial prescription of contraceptive pills: Secondary | ICD-10-CM

## 2023-10-13 DIAGNOSIS — Z124 Encounter for screening for malignant neoplasm of cervix: Secondary | ICD-10-CM | POA: Insufficient documentation

## 2023-10-13 MED ORDER — LEVONORGEST-ETH ESTRAD 91-DAY 0.15-0.03 &0.01 MG PO TABS
1.0000 | ORAL_TABLET | Freq: Every day | ORAL | 3 refills | Status: AC
  Filled 2023-10-13: qty 91, 91d supply, fill #0
  Filled 2024-01-04: qty 91, 91d supply, fill #1
  Filled 2024-03-03 – 2024-04-05 (×2): qty 91, 91d supply, fill #2
  Filled 2024-06-24: qty 91, 91d supply, fill #3

## 2023-10-13 NOTE — Patient Instructions (Signed)
 I value your feedback and you entrusting [place] with your care. If you get a [place] patient survey, I would appreciate you taking the time to let us know about your experience today. Thank you! ? ? ?

## 2023-10-13 NOTE — Progress Notes (Signed)
 PCP:  Lemar Pyles, NP   Chief Complaint  Patient presents with   Gynecologic Exam    No cycles with Nuvaring but will get random bleeding than can last anywhere from a few hours to 1-2 days. Bleeding during intercourse x 1 year, moderate pain during intercourse.     HPI:      [name] is a 38 y.o. G1P1001 who LMP was No LMP recorded. (Menstrual status: Other)., presents today for her annual examination.  Her menses are random with cont dosing nuvaring for migraine headache prevention, lasting few hrs to a couple days on active wks.  If bleeding doesn't stop, pt will do placebo wk and have 4-5 day period, mod flow, no dysmen.  Hx of migraine without aura for many yrs.   Sex activity: currently sex active with female partner- NuvaRing vaginal inserts. Seen 1/24 for PCB, treated with doxy; had neg STD testing 1/24 and neg GYN u/s 4/24. Wondered if related to cont dosing nuvaring but sx have persisted. Sx about every other time with sex, red blood starting during sex and lasting till next morning. Also pain at cx with sex/bleeding now. Has what looks like possible cx ectropion on exam in past. Had discussed colpo as next option last yr if sx persisted.  Last Pap: 06/23/22  Results were: no abnormalities /neg HPV DNA; paps due Q3 yrs for 25 yrs Hx of STDs: HPV--hx of CIN3 with LEEP 2012  There is a FH of breast cancer in her PGM, genetic testing not indicated. There is no FH of ovarian cancer. The patient does occas do self-breast exams.  Tobacco use: The patient denies current or previous tobacco use. Alcohol use: social drinker No drug use.  Exercise: moderately active  She does get adequate calcium and Vitamin D  in her diet.  Labs with PCP  Past Medical History:  Diagnosis Date   Abnormal Pap smear of cervix 12/19/2008, 02/20/2009   ASCUS   Abnormal Pap smear of cervix 08/06/2010, 10/01/2010   HGSIL   Cervical dysplasia CIN1 06/05/2004   CIN III (cervical  intraepithelial neoplasia grade III) with severe dysplasia 12/20/2010   LEEP Ecto Cervix   Dysmenorrhea    Migraines     Past Surgical History:  Procedure Laterality Date   CHOLECYSTECTOMY  2015   COLPOSCOPY  06/05/2004,02/20/2009,10/01/2010   LEEP  12/20/2010   ECC &Endo Cx Negative, Ecto CIN 2-3    Family History  Problem Relation Age of Onset   COPD Mother    Diabetes Mother    Diabetes Father    Hypertension Father    Bipolar disorder Father    Breast cancer Paternal Grandmother 15   Diabetes Sister    Obesity Sister        morbid   Mental retardation Sister    Healthy Daughter    Suicidality Paternal Grandfather     Social History   Socioeconomic History   Marital status: Single    Spouse name: Not on file   Number of children: 1   Years of education: College   Highest education level: Bachelor's degree (e.g., BA, AB, BS)  Occupational History    Comment: RN in ED, night shift  Tobacco Use   Smoking status: Former    Current packs/day: 0.00    Average packs/day: 1 pack/day for 13.0 years (13.0 ttl pk-yrs)    Types: Cigarettes    Start date: 2005    Quit date: 2018    Years  since quitting: 7.3   Smokeless tobacco: Never  Vaping Use   Vaping status: Never Used  Substance and Sexual Activity   Alcohol use: Yes    Comment: once every 2 weeks, occasionally >4   Drug use: No   Sexual activity: Yes    Birth control/protection: Inserts    Comment: nuvaring  Other Topics Concern   Not on file  Social History Narrative   Works as an Nutritional therapist at [employer]   One Daughter - Saint Cranker - 63 years old now   Family support: dad is nearby and watches daughter   Enjoys: works a lot, spend time with friends, hiking, corn-hole, beach   Exercise: gym regularly - 1-2 since the gym closed   Diet: pretty healthy overall   Social Drivers of Corporate investment banker Strain: Low Risk  (03/06/2022)   Overall Financial Resource Strain (CARDIA)    Difficulty of Paying Living  Expenses: Not hard at all  Food Insecurity: No Food Insecurity (05/21/2023)   Hunger Vital Sign    Worried About Running Out of Food in the Last Year: Never true    Ran Out of Food in the Last Year: Never true  Transportation Needs: No Transportation Needs (05/21/2023)   PRAPARE - Administrator, Civil Service (Medical): No    Lack of Transportation (Non-Medical): No  Physical Activity: Sufficiently Active (03/06/2022)   Exercise Vital Sign    Days of Exercise per Week: 5 days    Minutes of Exercise per Session: 30 min  Stress: Stress Concern Present (03/06/2022)   [employer] of Occupational Health - Occupational Stress Questionnaire    Feeling of Stress : To some extent  Social Connections: Moderately Isolated (03/06/2022)   Social Connection and Isolation Panel [NHANES]    Frequency of Communication with Friends and Family: More than three times a week    Frequency of Social Gatherings with Friends and Family: More than three times a week    Attends Religious Services: More than 4 times per year    Active Member of [employer] or Organizations: No    Attends Banker Meetings: Never    Marital Status: Separated  Intimate Partner Violence: Not At Risk (05/21/2023)   Humiliation, Afraid, Rape, and Kick questionnaire    Fear of Current or Ex-Partner: No    Emotionally Abused: No    Physically Abused: No    Sexually Abused: No    Outpatient Medications Prior to Visit  Medication Sig Dispense Refill   acetaminophen  (TYLENOL ) 500 MG tablet Take 500 mg by mouth every 6 (six) hours as needed.     Atogepant  (QULIPTA ) 60 MG TABS Take 1 tablet (60 mg) by mouth daily. 90 tablet 3   levothyroxine  (SYNTHROID ) 125 MCG tablet Take 1 tablet (125 mcg total) by mouth daily. 60 tablet 2   ondansetron  (ZOFRAN -ODT) 4 MG disintegrating tablet Take 1 tablet (4 mg total) by mouth every 8 (eight) hours as needed for nausea or vomiting. 10 tablet 3   SUMAtriptan  (IMITREX ) 100 MG  tablet Take 1 tablet (100 mg total) by mouth once as needed for up to 1 dose for migraine. May repeat in 2 hours if headache persists or recurs. 9 tablet 11   tirzepatide  (ZEPBOUND ) 10 MG/0.5ML Pen Inject 10 mg into the skin once a week. (Patient taking differently: Inject 10 mg into the skin once a week. Mondays) 2 mL 12   topiramate  (TOPAMAX ) 100 MG tablet Take 1 tablet (100  mg total) by mouth 2 (two) times daily. Please keep upcoming appt in March for continued refills. 180 tablet 3   etonogestrel -ethinyl estradiol  (NUVARING) 0.12-0.015 MG/24HR vaginal ring Insert 1 ring vaginally and leave in place for 3 consecutive weeks, then replace. For continuous dosing due to headaches. 3 each 1   Cholecalciferol  25 MCG (1000 UT) capsule Take 2,000 Units by mouth daily. (Patient not taking: Reported on 10/13/2023)     Aspirin-Acetaminophen -Caffeine (EXCEDRIN PO) Take by mouth as needed.     Biotin 2500 MCG CHEW Chew 1 tablet by mouth daily.     cyanocobalamin  (VITAMIN B12) 1000 MCG tablet Take 1,000 mcg by mouth daily.     fluticasone  (FLONASE ) 50 MCG/ACT nasal spray Place 1-2 sprays into both nostrils daily.     Insulin  Pen Needle (UNIFINE PENTIPS) 31G X 5 MM MISC Use with Saxenda  100 each 0   magnesium  gluconate (MAGONATE) 500 MG tablet Take 1,000 mg by mouth daily.     oxymetazoline  (AFRIN) 0.05 % nasal spray Place 1 spray into both nostrils 2 (two) times daily as needed for congestion.     No facility-administered medications prior to visit.      ROS:  Review of Systems  Constitutional:  Negative for fever and unexpected weight change.  Respiratory:  Negative for cough, shortness of breath and wheezing.   Cardiovascular:  Negative for chest pain, palpitations and leg swelling.  Gastrointestinal:  Negative for blood in stool, constipation, diarrhea, nausea and vomiting.  Endocrine: Negative for cold intolerance, heat intolerance and polyuria.  Genitourinary:  Positive for dyspareunia, vaginal  bleeding and vaginal discharge. Negative for dysuria, flank pain, frequency, genital sores, hematuria, menstrual problem, pelvic pain, urgency and vaginal pain.  Musculoskeletal:  Negative for back pain, joint swelling and myalgias.  Skin:  Negative for rash.  Neurological:  Negative for dizziness, syncope, light-headedness, numbness and headaches.  Hematological:  Negative for adenopathy.  Psychiatric/Behavioral:  Negative for agitation, confusion, sleep disturbance and suicidal ideas. The patient is not nervous/anxious.    BREAST: No symptoms   Objective: BP (!) 108/56   Pulse 72   Ht 5\' 4"  (1.626 m)   Wt 146 lb (66.2 kg)   BMI 25.06 kg/m  Cervical ectropion  Physical Exam Constitutional:      Appearance: She is well-developed.  Genitourinary:     Vulva normal.     Genitourinary Comments: NO BLEEDING ON EXAM; PROMINENT TRANSFORMATION ZONE VS CX ECTROPION VS POLYP?; TENDER TO PROBE BUT NO BLEEDING; TISSUE LOOKS LIKE COMING OUT OF CX THIS YR VS EXTERNAL SURFACE OF CX     Right Labia: No rash, tenderness or lesions.    Left Labia: No tenderness, lesions or rash.    No vaginal discharge, erythema, tenderness or bleeding.      Right Adnexa: not tender and no mass present.    Left Adnexa: not tender and no mass present.    No cervical motion tenderness, friability or polyp.        Uterus is not enlarged or tender.  Breasts:    Right: No mass, nipple discharge, skin change or tenderness.     Left: No mass, nipple discharge, skin change or tenderness.  Neck:     Thyroid : No thyromegaly.  Cardiovascular:     Rate and Rhythm: Normal rate and regular rhythm.     Heart sounds: Normal heart sounds. No murmur heard. Pulmonary:     Effort: Pulmonary effort is normal.     Breath sounds: Normal breath  sounds.  Abdominal:     Palpations: Abdomen is soft.     Tenderness: There is no abdominal tenderness. There is no guarding or rebound.  Musculoskeletal:        General: Normal range  of motion.     Cervical back: Normal range of motion.  Lymphadenopathy:     Cervical: No cervical adenopathy.  Neurological:     General: No focal deficit present.     Mental Status: She is alert and oriented to person, place, and time.     Cranial Nerves: No cranial nerve deficit.  Skin:    General: Skin is warm and dry.  Psychiatric:        Mood and Affect: Mood normal.        Behavior: Behavior normal.        Thought Content: Thought content normal.        Judgment: Judgment normal.  Vitals reviewed.     Assessment/Plan: Encounter for annual routine gynecological examination  Cervical cancer screening - Plan: Cytology - PAP  Screening for HPV (human papillomavirus) - Plan: Cytology - PAP  Encounter for initial prescription of contraceptive pills - Plan: Levonorgestrel-Ethinyl Estradiol  (AMETHIA) 0.15-0.03 &0.01 MG tablet; BC options discussed and pt would like to try cont dosing with OCPs. Rx eRxd. F/u prn. May still have BTB due to cont dosing. F/u prn.   PCB (post coital bleeding) - Plan: Cytology - PAP; repeat pap again. F/u with Dr. Dell Fennel with colpo machine to eval further. Unsure if cx ectropion now vs polyps?   Meds ordered this encounter  Medications   Levonorgestrel-Ethinyl Estradiol  (AMETHIA) 0.15-0.03 &0.01 MG tablet    Sig: Take 1 tablet by mouth daily.    Dispense:  91 tablet    Refill:  3    Supervising Provider:   ROBY, MICIA [[id number]]             GYN counsel adequate intake of calcium and vitamin D , diet and exercise     F/U  Return for PCB wtih Dr. Dell Fennel needs colpo machine/ room (not colposcopy procedure).  Aquilla Shambley B. Gerhardt Gleed, PA-C 10/13/2023 3:01 PM

## 2023-10-16 LAB — CYTOLOGY - PAP
Comment: NEGATIVE
Diagnosis: NEGATIVE
High risk HPV: NEGATIVE

## 2023-11-04 ENCOUNTER — Encounter: Payer: Self-pay | Admitting: Obstetrics

## 2023-11-04 ENCOUNTER — Other Ambulatory Visit (HOSPITAL_COMMUNITY)
Admission: RE | Admit: 2023-11-04 | Discharge: 2023-11-04 | Disposition: A | Discharge status: Home / Self Care | Source: Ambulatory Visit | Attending: Obstetrics | Admitting: Obstetrics

## 2023-11-04 ENCOUNTER — Other Ambulatory Visit: Payer: Self-pay

## 2023-11-04 ENCOUNTER — Ambulatory Visit: Admitting: Obstetrics

## 2023-11-04 VITALS — BP 125/66 | HR 58 | Wt 148.0 lb

## 2023-11-04 DIAGNOSIS — N888 Other specified noninflammatory disorders of cervix uteri: Secondary | ICD-10-CM | POA: Insufficient documentation

## 2023-11-04 DIAGNOSIS — N841 Polyp of cervix uteri: Secondary | ICD-10-CM | POA: Diagnosis not present

## 2023-11-04 DIAGNOSIS — N930 Postcoital and contact bleeding: Secondary | ICD-10-CM | POA: Diagnosis not present

## 2023-11-04 MED FILL — Levothyroxine Sodium Tab 125 MCG: ORAL | 30 days supply | Qty: 30 | Fill #2 | Status: AC

## 2023-11-04 NOTE — Progress Notes (Signed)
    GYNECOLOGY PROGRESS NOTE  Subjective:  PCP: Christy Pyles, NP  Patient ID: Christy Whitehead, female    DOB: 1986/04/20, 38 y.o.   MRN: [id number]  HPI  Patient is a 38 y.o. G55P1001 female who presents for bleeding after intercourse for the past year. She saw Christy Whitehead on 10/13/23 for this and per her note, prominent transformation zone vs cs ectropion vs polyp?; tender to probe but no bleeding; tissue looks like coming out of cx this yr vs external surface of cx and had patient see us  today to evaluate.   Today patient states she has bleeding with intercourse almost every time. She is in sexual relationship with a female and reports that even a finger or object that is small will sometimes cause pain when it hits a certain spot and then she will bleed. The bleeding will last through that night and self-resolve.   Last pap: 10/13/23 NILM, HPV-  Hx of abnormal pap: 2021 hx of CIN3 w/LEEP  The following portions of the patient's history were reviewed and updated as appropriate: allergies, current medications, past family history, past medical history, past social history, past surgical history, and problem list.  Review of Systems Pertinent items are noted in HPI.   Objective:   Blood pressure 125/66, pulse (!) 58, weight 148 lb (67.1 kg), last menstrual period 10/18/2023. Body mass index is 25.4 kg/m.  General appearance: alert and cooperative Abdomen: soft, non-tender; bowel sounds normal; no masses,  no organomegaly Pelvic: external genitalia normal, no cervical motion tenderness, uterus normal size, shape, and consistency, and vagina normal without discharge; os dilated approx 1cm with protruding polyp-appearing tissue, friable and tender to gentle probing.  Extremities: extremities normal, atraumatic, no cyanosis or edema Neurologic: Grossly normal    Cervix visualized and polyp noted.  Verbal consent obtained for polypectomy. Ring forcep applied to tissue,  very tender, and twisting motion removed mucoid tissue. This was very painful for the patient and she requested we not attempt w/the twisting motion again. Using colposcopic biopsy punch, a small biopsy obtained from superior portion of mass. Good hemostasis achieved.   Assessment/Plan:   1. Cervical mass   2. PCB (post coital bleeding)    38 y.o. G1P1001 with PCB for the past year, with a cervical mass protruding from os. Biopsy obtained, full removal not attempted due to patient discomfort. Sent to pathology. Will also obtain a pelvic US  and call pt with results of both and next steps. Bleeding precautions given.    Christy Dunn, DO [facility]

## 2023-11-05 ENCOUNTER — Ambulatory Visit

## 2023-11-05 DIAGNOSIS — N888 Other specified noninflammatory disorders of cervix uteri: Secondary | ICD-10-CM | POA: Diagnosis not present

## 2023-11-05 NOTE — Addendum Note (Signed)
 Addended by: Sofia Dunn on: 11/05/2023 08:31 AM   Modules accepted: Orders

## 2023-11-11 ENCOUNTER — Encounter: Payer: Self-pay | Admitting: Obstetrics

## 2023-11-16 ENCOUNTER — Ambulatory Visit: Payer: Self-pay | Admitting: Obstetrics

## 2023-11-16 NOTE — Telephone Encounter (Signed)
 Spoke by telephone with pt re: benign cervical polyp bx results and pending US ; reviewed images and prelim report and polyp is localized to cervix, not communicating up higher. Pt desires removal, reviewed options of in-office with paracervical block, versus in same-day surgery with anesthesia. Pt desires anesthesia, will have scheduling reach out and schedule her for pre-op appointment.

## 2023-11-23 ENCOUNTER — Other Ambulatory Visit: Payer: Self-pay | Admitting: Neurology

## 2023-11-23 ENCOUNTER — Other Ambulatory Visit: Payer: Self-pay

## 2023-11-23 MED FILL — Topiramate Tab 100 MG: ORAL | 30 days supply | Qty: 60 | Fill #0 | Status: AC

## 2023-11-24 ENCOUNTER — Ambulatory Visit (INDEPENDENT_AMBULATORY_CARE_PROVIDER_SITE_OTHER): Admitting: Obstetrics

## 2023-11-24 ENCOUNTER — Other Ambulatory Visit: Payer: Self-pay

## 2023-11-24 ENCOUNTER — Encounter: Payer: Self-pay | Admitting: Obstetrics

## 2023-11-24 VITALS — BP 104/63 | HR 74 | Ht 64.0 in | Wt 147.0 lb

## 2023-11-24 DIAGNOSIS — N941 Unspecified dyspareunia: Secondary | ICD-10-CM

## 2023-11-24 DIAGNOSIS — N841 Polyp of cervix uteri: Secondary | ICD-10-CM | POA: Diagnosis not present

## 2023-11-24 DIAGNOSIS — N930 Postcoital and contact bleeding: Secondary | ICD-10-CM | POA: Diagnosis not present

## 2023-11-24 DIAGNOSIS — Z01818 Encounter for other preprocedural examination: Secondary | ICD-10-CM | POA: Diagnosis not present

## 2023-11-24 NOTE — Progress Notes (Unsigned)
 GYNECOLOGY PROGRESS NOTE  Subjective:  PCP: Valerio Melanie DASEN, NP  Patient ID: Christy Whitehead, female    DOB: February 15, 1986, 38 y.o.   MRN: [id number]  HPI  Patient is a 38 y.o. G1P1001 female who presents for pre-op appointment for cervical polyp removal. Polyp friable and tender, biopsied on 11/04/23, was benign, but is very tender to palpation and pt desires sedation for removal.   OB History  Gravida Para Term Preterm AB Living  1 1 1   1   SAB IAB Ectopic Multiple Live Births          # Outcome Date GA Lbr Len/2nd Weight Sex Type Anes PTL Lv  1 Term 07/05/05    F Vag-Spont      Past Medical History:  Diagnosis Date   Abnormal Pap smear of cervix 12/19/2008, 02/20/2009   ASCUS   Abnormal Pap smear of cervix 08/06/2010, 10/01/2010   HGSIL   Cervical dysplasia CIN1 06/05/2004   CIN III (cervical intraepithelial neoplasia grade III) with severe dysplasia 12/20/2010   LEEP Ecto Cervix   Dysmenorrhea    Migraines    Patient Active Problem List   Diagnosis Date Noted   Cervical polyp 11/25/2023   Postcoital and contact bleeding 11/25/2023   Dyspareunia, female 11/25/2023   Pyelonephritis 05/21/2023   Sepsis (HCC) 05/21/2023   Acute renal failure (HCC) 05/21/2023   Normocytic anemia 05/21/2023   Hypothyroidism 05/21/2023   History of migraine headaches 05/21/2023   Eye twitch 04/17/2023   BMI 25.0-25.9,adult 01/29/2023   Vitamin D  deficiency 03/06/2022   Hashimoto's thyroiditis 10/10/2021   Depression, major, single episode, moderate (HCC) 09/03/2021   Generalized anxiety disorder 09/03/2021   High grade squamous intraepithelial lesion (HGSIL), grade 3 CIN, on biopsy of cervix 07/09/2021   History of cervical dysplasia 02/02/2019   Migraines    Past Surgical History:  Procedure Laterality Date   CHOLECYSTECTOMY  2015   COLPOSCOPY  06/05/2004,02/20/2009,10/01/2010   LEEP  12/20/2010   ECC &Endo Cx Negative, Ecto CIN 2-3   Family History  Problem Relation Age of  Onset   COPD Mother    Diabetes Mother    Diabetes Father    Hypertension Father    Bipolar disorder Father    Breast cancer Paternal Grandmother 3   Diabetes Sister    Obesity Sister        morbid   Mental retardation Sister    Healthy Daughter    Suicidality Paternal Grandfather    Social History   Socioeconomic History   Marital status: Single    Spouse name: Not on file   Number of children: 1   Years of education: College   Highest education level: Bachelor's degree (e.g., BA, AB, BS)  Occupational History    Comment: RN in ED, night shift  Tobacco Use   Smoking status: Former    Current packs/day: 0.00    Average packs/day: 1 pack/day for 13.0 years (13.0 ttl pk-yrs)    Types: Cigarettes    Start date: 2005    Quit date: 2018    Years since quitting: 7.5   Smokeless tobacco: Never  Vaping Use   Vaping status: Never Used  Substance and Sexual Activity   Alcohol use: Yes    Comment: once every 2 weeks, occasionally >4   Drug use: No   Sexual activity: Yes    Birth control/protection: Inserts    Comment: nuvaring  Other Topics Concern   Not on file  Social  History Narrative   Works as an Nutritional therapist at [employer]   One Daughter - Laney - 52 years old now   Family support: dad is nearby and watches daughter   Enjoys: works a lot, spend time with friends, hiking, corn-hole, beach   Exercise: gym regularly - 1-2 since the gym closed   Diet: pretty healthy overall   Social Drivers of Corporate investment banker Strain: Low Risk  (03/06/2022)   Overall Financial Resource Strain (CARDIA)    Difficulty of Paying Living Expenses: Not hard at all  Food Insecurity: No Food Insecurity (05/21/2023)   Hunger Vital Sign    Worried About Running Out of Food in the Last Year: Never true    Ran Out of Food in the Last Year: Never true  Transportation Needs: No Transportation Needs (05/21/2023)   PRAPARE - Administrator, Civil Service (Medical): No    Lack of  Transportation (Non-Medical): No  Physical Activity: Sufficiently Active (03/06/2022)   Exercise Vital Sign    Days of Exercise per Week: 5 days    Minutes of Exercise per Session: 30 min  Stress: Stress Concern Present (03/06/2022)   [employer] of Occupational Health - Occupational Stress Questionnaire    Feeling of Stress : To some extent  Social Connections: Moderately Isolated (03/06/2022)   Social Connection and Isolation Panel    Frequency of Communication with Friends and Family: More than three times a week    Frequency of Social Gatherings with Friends and Family: More than three times a week    Attends Religious Services: More than 4 times per year    Active Member of [employer] or Organizations: No    Attends Banker Meetings: Never    Marital Status: Separated  Intimate Partner Violence: Not At Risk (05/21/2023)   Humiliation, Afraid, Rape, and Kick questionnaire    Fear of Current or Ex-Partner: No    Emotionally Abused: No    Physically Abused: No    Sexually Abused: No   Current Outpatient Medications on File Prior to Visit  Medication Sig Dispense Refill   acetaminophen  (TYLENOL ) 500 MG tablet Take 500 mg by mouth every 6 (six) hours as needed.     Atogepant  (QULIPTA ) 60 MG TABS Take 1 tablet (60 mg) by mouth daily. 90 tablet 3   Cholecalciferol  25 MCG (1000 UT) capsule Take 2,000 Units by mouth daily.     Levonorgestrel-Ethinyl Estradiol  (AMETHIA) 0.15-0.03 &0.01 MG tablet Take 1 tablet by mouth daily. 91 tablet 3   levothyroxine  (SYNTHROID ) 125 MCG tablet Take 1 tablet (125 mcg total) by mouth daily. 60 tablet 2   ondansetron  (ZOFRAN -ODT) 4 MG disintegrating tablet Take 1 tablet (4 mg total) by mouth every 8 (eight) hours as needed for nausea or vomiting. 10 tablet 3   SUMAtriptan  (IMITREX ) 100 MG tablet Take 1 tablet (100 mg total) by mouth once as needed for up to 1 dose for migraine. May repeat in 2 hours if headache persists or recurs. 9 tablet 11    tirzepatide  (ZEPBOUND ) 10 MG/0.5ML Pen Inject 10 mg into the skin once a week. 2 mL 12   topiramate  (TOPAMAX ) 100 MG tablet Take 1 tablet (100 mg total) by mouth 2 (two) times daily. Must be seen for further refills. 60 tablet 0   No current facility-administered medications on file prior to visit.   Allergies  Allergen Reactions   Aimovig  [Erenumab -Aooe] Hives    Site reaction   Emgality  [  Galcanezumab -Gnlm] Hives    Site reaction   Review of Systems Pertinent items are noted in HPI.   Objective:   Blood pressure 104/63, pulse 74, height 5' 4 (1.626 m), weight 147 lb (66.7 kg), last menstrual period 10/18/2023. Body mass index is 25.23 kg/m.  General appearance: alert, cooperative, and no distress Abdomen: soft, non-tender; bowel sounds normal; no masses,  no organomegaly Pelvic: deferred Extremities: extremities normal, atraumatic, no cyanosis or edema Neurologic: Grossly normal  PELVIC US  Location: [facility] Date of Service: 11/05/2023    Indications:Possible cervical lesion Findings:  The cervix was seen and no distinct mass was identified. ? Area seen where cevical biopsy was taken. The uterus is anteverted and measures 6.9 x 2.5 x 4.4 cm with a uterine volume of 39.06 ml. Echo texture is homogenous without evidence of focal masses.   The Endometrium measures 2.8 mm.   Right Ovary measures 1.6 x .8 x .7 cm. It is normal in appearance. Left Ovary was not seen vaginally. Survey of the adnexa demonstrates no adnexal masses. There is no free fluid in the cul de sac. Partially obscuring bowel was noted.   Impression: 1. No cervical mass is seen 2. Lt ovary was not able to be seen vaginally.    Recommendations: 1.Clinical correlation with the patient's History and Physical Exam.   PATHOLOGY FINAL MICROSCOPIC DIAGNOSIS:   A. CERVIX, BIOPSY:       Benign endocervical polyp.       Cervical squamous cells without significant diagnostic  alteration.       No evidence of squamous intraepithelial lesion, glandular  hyperplasia or malignancy.    Assessment/Plan:   1. Cervical polyp   2. Pre-op exam   3. Postcoital and contact bleeding   4. Dyspareunia, female     38 y.o. G1P1001 with a friable and tender cervical polyp causing irregular bleeding and painful intercourse,  s/p biopsy showing benign, but desiring full removal with sedation. We reviewed R/B/A of this procedure and offered in-office w/paracervical block, pt declines due to discomfort with biopsy alone.  -Requesting 12/14/23, will schedule -Pre-op instructions reviewed -Will not need a formal post-op visit -All questions answered today   Estil Mangle, DO [facility]

## 2023-11-24 NOTE — H&P (View-Only) (Signed)
 GYNECOLOGY PROGRESS NOTE  Subjective:  PCP: Valerio Melanie DASEN, NP  Patient ID: Christy Whitehead, female    DOB: February 15, 1986, 38 y.o.   MRN: [id number]  HPI  Patient is a 38 y.o. G1P1001 female who presents for pre-op appointment for cervical polyp removal. Polyp friable and tender, biopsied on 11/04/23, was benign, but is very tender to palpation and pt desires sedation for removal.   OB History  Gravida Para Term Preterm AB Living  1 1 1   1   SAB IAB Ectopic Multiple Live Births          # Outcome Date GA Lbr Len/2nd Weight Sex Type Anes PTL Lv  1 Term 07/05/05    F Vag-Spont      Past Medical History:  Diagnosis Date   Abnormal Pap smear of cervix 12/19/2008, 02/20/2009   ASCUS   Abnormal Pap smear of cervix 08/06/2010, 10/01/2010   HGSIL   Cervical dysplasia CIN1 06/05/2004   CIN III (cervical intraepithelial neoplasia grade III) with severe dysplasia 12/20/2010   LEEP Ecto Cervix   Dysmenorrhea    Migraines    Patient Active Problem List   Diagnosis Date Noted   Cervical polyp 11/25/2023   Postcoital and contact bleeding 11/25/2023   Dyspareunia, female 11/25/2023   Pyelonephritis 05/21/2023   Sepsis (HCC) 05/21/2023   Acute renal failure (HCC) 05/21/2023   Normocytic anemia 05/21/2023   Hypothyroidism 05/21/2023   History of migraine headaches 05/21/2023   Eye twitch 04/17/2023   BMI 25.0-25.9,adult 01/29/2023   Vitamin D  deficiency 03/06/2022   Hashimoto's thyroiditis 10/10/2021   Depression, major, single episode, moderate (HCC) 09/03/2021   Generalized anxiety disorder 09/03/2021   High grade squamous intraepithelial lesion (HGSIL), grade 3 CIN, on biopsy of cervix 07/09/2021   History of cervical dysplasia 02/02/2019   Migraines    Past Surgical History:  Procedure Laterality Date   CHOLECYSTECTOMY  2015   COLPOSCOPY  06/05/2004,02/20/2009,10/01/2010   LEEP  12/20/2010   ECC &Endo Cx Negative, Ecto CIN 2-3   Family History  Problem Relation Age of  Onset   COPD Mother    Diabetes Mother    Diabetes Father    Hypertension Father    Bipolar disorder Father    Breast cancer Paternal Grandmother 3   Diabetes Sister    Obesity Sister        morbid   Mental retardation Sister    Healthy Daughter    Suicidality Paternal Grandfather    Social History   Socioeconomic History   Marital status: Single    Spouse name: Not on file   Number of children: 1   Years of education: College   Highest education level: Bachelor's degree (e.g., BA, AB, BS)  Occupational History    Comment: RN in ED, night shift  Tobacco Use   Smoking status: Former    Current packs/day: 0.00    Average packs/day: 1 pack/day for 13.0 years (13.0 ttl pk-yrs)    Types: Cigarettes    Start date: 2005    Quit date: 2018    Years since quitting: 7.5   Smokeless tobacco: Never  Vaping Use   Vaping status: Never Used  Substance and Sexual Activity   Alcohol use: Yes    Comment: once every 2 weeks, occasionally >4   Drug use: No   Sexual activity: Yes    Birth control/protection: Inserts    Comment: nuvaring  Other Topics Concern   Not on file  Social  History Narrative   Works as an Nutritional therapist at [employer]   One Daughter - Laney - 52 years old now   Family support: dad is nearby and watches daughter   Enjoys: works a lot, spend time with friends, hiking, corn-hole, beach   Exercise: gym regularly - 1-2 since the gym closed   Diet: pretty healthy overall   Social Drivers of Corporate investment banker Strain: Low Risk  (03/06/2022)   Overall Financial Resource Strain (CARDIA)    Difficulty of Paying Living Expenses: Not hard at all  Food Insecurity: No Food Insecurity (05/21/2023)   Hunger Vital Sign    Worried About Running Out of Food in the Last Year: Never true    Ran Out of Food in the Last Year: Never true  Transportation Needs: No Transportation Needs (05/21/2023)   PRAPARE - Administrator, Civil Service (Medical): No    Lack of  Transportation (Non-Medical): No  Physical Activity: Sufficiently Active (03/06/2022)   Exercise Vital Sign    Days of Exercise per Week: 5 days    Minutes of Exercise per Session: 30 min  Stress: Stress Concern Present (03/06/2022)   [employer] of Occupational Health - Occupational Stress Questionnaire    Feeling of Stress : To some extent  Social Connections: Moderately Isolated (03/06/2022)   Social Connection and Isolation Panel    Frequency of Communication with Friends and Family: More than three times a week    Frequency of Social Gatherings with Friends and Family: More than three times a week    Attends Religious Services: More than 4 times per year    Active Member of [employer] or Organizations: No    Attends Banker Meetings: Never    Marital Status: Separated  Intimate Partner Violence: Not At Risk (05/21/2023)   Humiliation, Afraid, Rape, and Kick questionnaire    Fear of Current or Ex-Partner: No    Emotionally Abused: No    Physically Abused: No    Sexually Abused: No   Current Outpatient Medications on File Prior to Visit  Medication Sig Dispense Refill   acetaminophen  (TYLENOL ) 500 MG tablet Take 500 mg by mouth every 6 (six) hours as needed.     Atogepant  (QULIPTA ) 60 MG TABS Take 1 tablet (60 mg) by mouth daily. 90 tablet 3   Cholecalciferol  25 MCG (1000 UT) capsule Take 2,000 Units by mouth daily.     Levonorgestrel-Ethinyl Estradiol  (AMETHIA) 0.15-0.03 &0.01 MG tablet Take 1 tablet by mouth daily. 91 tablet 3   levothyroxine  (SYNTHROID ) 125 MCG tablet Take 1 tablet (125 mcg total) by mouth daily. 60 tablet 2   ondansetron  (ZOFRAN -ODT) 4 MG disintegrating tablet Take 1 tablet (4 mg total) by mouth every 8 (eight) hours as needed for nausea or vomiting. 10 tablet 3   SUMAtriptan  (IMITREX ) 100 MG tablet Take 1 tablet (100 mg total) by mouth once as needed for up to 1 dose for migraine. May repeat in 2 hours if headache persists or recurs. 9 tablet 11    tirzepatide  (ZEPBOUND ) 10 MG/0.5ML Pen Inject 10 mg into the skin once a week. 2 mL 12   topiramate  (TOPAMAX ) 100 MG tablet Take 1 tablet (100 mg total) by mouth 2 (two) times daily. Must be seen for further refills. 60 tablet 0   No current facility-administered medications on file prior to visit.   Allergies  Allergen Reactions   Aimovig  [Erenumab -Aooe] Hives    Site reaction   Emgality  [  Galcanezumab -Gnlm] Hives    Site reaction   Review of Systems Pertinent items are noted in HPI.   Objective:   Blood pressure 104/63, pulse 74, height 5' 4 (1.626 m), weight 147 lb (66.7 kg), last menstrual period 10/18/2023. Body mass index is 25.23 kg/m.  General appearance: alert, cooperative, and no distress Abdomen: soft, non-tender; bowel sounds normal; no masses,  no organomegaly Pelvic: deferred Extremities: extremities normal, atraumatic, no cyanosis or edema Neurologic: Grossly normal  PELVIC US  Location: [facility] Date of Service: 11/05/2023    Indications:Possible cervical lesion Findings:  The cervix was seen and no distinct mass was identified. ? Area seen where cevical biopsy was taken. The uterus is anteverted and measures 6.9 x 2.5 x 4.4 cm with a uterine volume of 39.06 ml. Echo texture is homogenous without evidence of focal masses.   The Endometrium measures 2.8 mm.   Right Ovary measures 1.6 x .8 x .7 cm. It is normal in appearance. Left Ovary was not seen vaginally. Survey of the adnexa demonstrates no adnexal masses. There is no free fluid in the cul de sac. Partially obscuring bowel was noted.   Impression: 1. No cervical mass is seen 2. Lt ovary was not able to be seen vaginally.    Recommendations: 1.Clinical correlation with the patient's History and Physical Exam.   PATHOLOGY FINAL MICROSCOPIC DIAGNOSIS:   A. CERVIX, BIOPSY:       Benign endocervical polyp.       Cervical squamous cells without significant diagnostic  alteration.       No evidence of squamous intraepithelial lesion, glandular  hyperplasia or malignancy.    Assessment/Plan:   1. Cervical polyp   2. Pre-op exam   3. Postcoital and contact bleeding   4. Dyspareunia, female     38 y.o. G1P1001 with a friable and tender cervical polyp causing irregular bleeding and painful intercourse,  s/p biopsy showing benign, but desiring full removal with sedation. We reviewed R/B/A of this procedure and offered in-office w/paracervical block, pt declines due to discomfort with biopsy alone.  -Requesting 12/14/23, will schedule -Pre-op instructions reviewed -Will not need a formal post-op visit -All questions answered today   Estil Mangle, DO [facility]

## 2023-11-25 ENCOUNTER — Encounter: Payer: Self-pay | Admitting: Obstetrics

## 2023-11-25 DIAGNOSIS — N941 Unspecified dyspareunia: Secondary | ICD-10-CM

## 2023-11-25 DIAGNOSIS — N841 Polyp of cervix uteri: Secondary | ICD-10-CM | POA: Insufficient documentation

## 2023-11-25 DIAGNOSIS — N930 Postcoital and contact bleeding: Secondary | ICD-10-CM | POA: Insufficient documentation

## 2023-11-25 HISTORY — DX: Postcoital and contact bleeding: N93.0

## 2023-11-25 HISTORY — DX: Polyp of cervix uteri: N84.1

## 2023-11-25 HISTORY — DX: Unspecified dyspareunia: N94.10

## 2023-12-04 ENCOUNTER — Other Ambulatory Visit: Payer: Self-pay | Admitting: Obstetrics

## 2023-12-04 DIAGNOSIS — N841 Polyp of cervix uteri: Secondary | ICD-10-CM

## 2023-12-04 DIAGNOSIS — N941 Unspecified dyspareunia: Secondary | ICD-10-CM

## 2023-12-04 DIAGNOSIS — N930 Postcoital and contact bleeding: Secondary | ICD-10-CM

## 2023-12-07 ENCOUNTER — Other Ambulatory Visit: Payer: Self-pay

## 2023-12-07 ENCOUNTER — Encounter
Admission: RE | Admit: 2023-12-07 | Discharge: 2023-12-07 | Disposition: A | Discharge status: Home / Self Care | Source: Ambulatory Visit | Attending: Obstetrics | Admitting: Obstetrics

## 2023-12-07 VITALS — Ht 64.0 in | Wt 147.0 lb

## 2023-12-07 DIAGNOSIS — Z01812 Encounter for preprocedural laboratory examination: Secondary | ICD-10-CM

## 2023-12-07 DIAGNOSIS — N179 Acute kidney failure, unspecified: Secondary | ICD-10-CM

## 2023-12-07 HISTORY — DX: Anxiety disorder, unspecified: F41.9

## 2023-12-07 HISTORY — DX: Anemia, unspecified: D64.9

## 2023-12-07 HISTORY — DX: Hypothyroidism, unspecified: E03.9

## 2023-12-07 HISTORY — DX: Depression, unspecified: F32.A

## 2023-12-07 NOTE — Patient Instructions (Addendum)
 Your procedure is scheduled on:   MONDAY JULY 21  Report to the Registration Desk on the 1st floor of the [facility] To find out your arrival time, please call [phone] between 1PM - 3PM on:  FRIDAY JULY 18  If your arrival time is 6:00 am, do not arrive before that time as the [facility] entrance doors do not open until 6:00 am.  REMEMBER: Instructions that are not followed completely may result in serious medical risk, up to and including death; or upon the discretion of your surgeon and anesthesiologist your surgery may need to be rescheduled.  Do not eat food after midnight the night before surgery.  No gum chewing or hard candies.  One week prior to surgery: MONDAY JULY 14  Stop Anti-inflammatories (NSAIDS) such as Advil, Aleve, Ibuprofen, Motrin, Naproxen, Naprosyn and Aspirin based products such as Excedrin, Goody's Powder, BC Powder. Stop ANY OVER THE COUNTER supplements until after surgery. Cholecalciferol    You may however, continue to take Tylenol  if needed for pain up until the day of surgery.  tirzepatide  (ZEPBOUND ) hold 7 days prior to surgery, last dose Sunday July 13  Continue taking all of your other prescription medications up until the day of surgery.  ON THE DAY OF SURGERY ONLY TAKE THESE MEDICATIONS WITH SIPS OF WATER :  levothyroxine  (SYNTHROID )   No Alcohol for 24 hours before or after surgery.  Do not use any recreational drugs for at least a week (preferably 2 weeks) before your surgery.  Please be advised that the combination of cocaine and anesthesia may have negative outcomes, up to and including death. If you test positive for cocaine, your surgery will be cancelled.  On the morning of surgery brush your teeth with toothpaste and water , you may rinse your mouth with mouthwash if you wish. Do not swallow any toothpaste or mouthwash.  Do not wear jewelry, make-up, hairpins, clips or nail polish.  For welded (permanent) jewelry: bracelets,  anklets, waist bands, etc.  Please have this removed prior to surgery.  If it is not removed, there is a chance that hospital personnel will need to cut it off on the day of surgery.  Do not wear lotions, powders, or perfumes.   Do not shave body hair from the neck down 48 hours before surgery.  Do not bring valuables to the hospital. [facility] is not responsible for any missing/lost belongings or valuables.   Notify your doctor if there is any change in your medical condition (cold, fever, infection).  Wear comfortable clothing (specific to your surgery type) to the hospital.  After surgery, you can help prevent lung complications by doing breathing exercises.  Take deep breaths and cough every 1-2 hours.   If you are being discharged the day of surgery, you will not be allowed to drive home. You will need a responsible individual to drive you home and stay with you for 24 hours after surgery.   If you are taking public transportation, you will need to have a responsible individual with you.  Please call the Pre-admissions Testing Dept. at [phone] if you have any questions about these instructions.  Surgery Visitation Policy:  Patients having surgery or a procedure may have two visitors.  Children under the age of 36 must have an adult with them who is not the patient.  Merchandiser, retail to address health-related social needs:  [email or web address]

## 2023-12-08 ENCOUNTER — Other Ambulatory Visit: Payer: Self-pay

## 2023-12-08 MED FILL — Levothyroxine Sodium Tab 125 MCG: ORAL | 30 days supply | Qty: 30 | Fill #3 | Status: AC

## 2023-12-09 ENCOUNTER — Other Ambulatory Visit: Payer: Self-pay

## 2023-12-10 ENCOUNTER — Encounter
Admission: RE | Admit: 2023-12-10 | Discharge: 2023-12-10 | Disposition: A | Discharge status: Home / Self Care | Source: Ambulatory Visit | Attending: Obstetrics | Admitting: Obstetrics

## 2023-12-10 DIAGNOSIS — N941 Unspecified dyspareunia: Secondary | ICD-10-CM | POA: Diagnosis not present

## 2023-12-10 DIAGNOSIS — N841 Polyp of cervix uteri: Secondary | ICD-10-CM | POA: Diagnosis not present

## 2023-12-10 DIAGNOSIS — N179 Acute kidney failure, unspecified: Secondary | ICD-10-CM | POA: Diagnosis not present

## 2023-12-10 DIAGNOSIS — Z01818 Encounter for other preprocedural examination: Secondary | ICD-10-CM | POA: Diagnosis present

## 2023-12-10 DIAGNOSIS — N930 Postcoital and contact bleeding: Secondary | ICD-10-CM | POA: Diagnosis not present

## 2023-12-10 DIAGNOSIS — Z01812 Encounter for preprocedural laboratory examination: Secondary | ICD-10-CM | POA: Diagnosis not present

## 2023-12-10 LAB — BASIC METABOLIC PANEL WITH GFR
Anion gap: 7 (ref 5–15)
BUN: 22 mg/dL — ABNORMAL HIGH (ref 6–20)
CO2: 20 mmol/L — ABNORMAL LOW (ref 22–32)
Calcium: 8.8 mg/dL — ABNORMAL LOW (ref 8.9–10.3)
Chloride: 111 mmol/L (ref 98–111)
Creatinine, Ser: 0.86 mg/dL (ref 0.44–1.00)
GFR, Estimated: >60 mL/min (ref >60)
Glucose, Bld: 79 mg/dL (ref 70–99)
Potassium: 3.5 mmol/L (ref 3.5–5.1)
Sodium: 138 mmol/L (ref 135–145)

## 2023-12-10 LAB — CBC
HCT: 37.9 % (ref 36.0–46.0)
Hemoglobin: 12.3 g/dL (ref 12.0–15.0)
MCH: 28.9 pg (ref 26.0–34.0)
MCHC: 32.5 g/dL (ref 30.0–36.0)
MCV: 89.2 fL (ref 80.0–100.0)
Platelets: 349 K/uL (ref 150–400)
RBC: 4.25 MIL/uL (ref 3.87–5.11)
RDW: 13.8 % (ref 11.5–15.5)
WBC: 10.2 K/uL (ref 4.0–10.5)
nRBC: 0 % (ref 0.0–0.2)

## 2023-12-10 LAB — TYPE AND SCREEN
ABO/RH(D): A POS
Antibody Screen: NEGATIVE

## 2023-12-13 MED ORDER — LACTATED RINGERS IV SOLN: INTRAVENOUS | Status: DC

## 2023-12-13 MED ORDER — SODIUM CHLORIDE 0.9% FLUSH: 3.0000 mL | INTRAVENOUS | Status: DC | PRN

## 2023-12-13 MED ORDER — SODIUM CHLORIDE 0.9% FLUSH: 3.0000 mL | Freq: Two times a day (BID) | INTRAVENOUS | Status: DC

## 2023-12-13 MED ORDER — ACETAMINOPHEN 500 MG PO TABS
1000.0000 mg | ORAL_TABLET | ORAL | Status: AC
  Administered 2023-12-14: 1000 mg via ORAL

## 2023-12-14 ENCOUNTER — Other Ambulatory Visit: Payer: Self-pay

## 2023-12-14 ENCOUNTER — Encounter
Admission: RE | Disposition: A | Discharge status: Home / Self Care | Payer: Self-pay | Source: Ambulatory Visit | Attending: Obstetrics

## 2023-12-14 ENCOUNTER — Encounter: Payer: Self-pay | Admitting: Obstetrics

## 2023-12-14 ENCOUNTER — Ambulatory Visit
Admission: RE | Admit: 2023-12-14 | Discharge: 2023-12-14 | Disposition: A | Discharge status: Home / Self Care | Source: Ambulatory Visit | Attending: Obstetrics | Admitting: Obstetrics

## 2023-12-14 ENCOUNTER — Ambulatory Visit: Payer: Self-pay | Admitting: Urgent Care

## 2023-12-14 ENCOUNTER — Ambulatory Visit

## 2023-12-14 DIAGNOSIS — E039 Hypothyroidism, unspecified: Secondary | ICD-10-CM | POA: Diagnosis not present

## 2023-12-14 DIAGNOSIS — F419 Anxiety disorder, unspecified: Secondary | ICD-10-CM | POA: Diagnosis not present

## 2023-12-14 DIAGNOSIS — Z79899 Other long term (current) drug therapy: Secondary | ICD-10-CM | POA: Insufficient documentation

## 2023-12-14 DIAGNOSIS — N930 Postcoital and contact bleeding: Secondary | ICD-10-CM | POA: Diagnosis present

## 2023-12-14 DIAGNOSIS — G43909 Migraine, unspecified, not intractable, without status migrainosus: Secondary | ICD-10-CM | POA: Diagnosis not present

## 2023-12-14 DIAGNOSIS — Z79890 Hormone replacement therapy (postmenopausal): Secondary | ICD-10-CM | POA: Diagnosis not present

## 2023-12-14 DIAGNOSIS — N941 Unspecified dyspareunia: Secondary | ICD-10-CM | POA: Diagnosis not present

## 2023-12-14 DIAGNOSIS — Z87891 Personal history of nicotine dependence: Secondary | ICD-10-CM | POA: Diagnosis not present

## 2023-12-14 DIAGNOSIS — N888 Other specified noninflammatory disorders of cervix uteri: Secondary | ICD-10-CM

## 2023-12-14 DIAGNOSIS — N841 Polyp of cervix uteri: Secondary | ICD-10-CM | POA: Diagnosis not present

## 2023-12-14 DIAGNOSIS — E063 Autoimmune thyroiditis: Secondary | ICD-10-CM | POA: Insufficient documentation

## 2023-12-14 DIAGNOSIS — F32A Depression, unspecified: Secondary | ICD-10-CM | POA: Insufficient documentation

## 2023-12-14 HISTORY — PX: CERVICAL POLYPECTOMY: SHX88

## 2023-12-14 HISTORY — PX: DILATION AND CURETTAGE OF UTERUS: SHX78

## 2023-12-14 LAB — POCT PREGNANCY, URINE: Preg Test, Ur: NEGATIVE

## 2023-12-14 SURGERY — POLYPECTOMY, CERVIX
Anesthesia: General

## 2023-12-14 MED ORDER — FENTANYL CITRATE (PF) 100 MCG/2ML IJ SOLN
INTRAMUSCULAR | Status: AC
Start: 2023-12-14 — End: 2023-12-14
  Filled 2023-12-14: qty 2

## 2023-12-14 MED ORDER — IBUPROFEN 800 MG PO TABS
800.0000 mg | ORAL_TABLET | Freq: Three times a day (TID) | ORAL | 0 refills | Status: AC | PRN
  Filled 2023-12-14: qty 30, 10d supply, fill #0

## 2023-12-14 MED ORDER — ONDANSETRON HCL 4 MG/2ML IJ SOLN
INTRAMUSCULAR | Status: AC
  Filled 2023-12-14: qty 2

## 2023-12-14 MED ORDER — DEXMEDETOMIDINE HCL IN NACL 80 MCG/20ML IV SOLN
INTRAVENOUS | Status: DC | PRN
  Administered 2023-12-14: 8 ug via INTRAVENOUS

## 2023-12-14 MED ORDER — FENTANYL CITRATE (PF) 100 MCG/2ML IJ SOLN: 25.0000 ug | INTRAMUSCULAR | Status: DC | PRN

## 2023-12-14 MED ORDER — DEXAMETHASONE SODIUM PHOSPHATE 10 MG/ML IJ SOLN
INTRAMUSCULAR | Status: DC | PRN
  Administered 2023-12-14: 10 mg via INTRAVENOUS

## 2023-12-14 MED ORDER — OXYCODONE HCL 5 MG/5ML PO SOLN: 5.0000 mg | Freq: Once | ORAL | Status: DC | PRN

## 2023-12-14 MED ORDER — 0.9 % SODIUM CHLORIDE (POUR BTL) OPTIME
TOPICAL | Status: DC | PRN
  Administered 2023-12-14: 500 mL

## 2023-12-14 MED ORDER — FENTANYL CITRATE (PF) 100 MCG/2ML IJ SOLN
INTRAMUSCULAR | Status: DC | PRN
  Administered 2023-12-14: 50 ug via INTRAVENOUS

## 2023-12-14 MED ORDER — MIDAZOLAM HCL 2 MG/2ML IJ SOLN
INTRAMUSCULAR | Status: DC | PRN
  Administered 2023-12-14: 2 mg via INTRAVENOUS

## 2023-12-14 MED ORDER — DEXAMETHASONE SODIUM PHOSPHATE 10 MG/ML IJ SOLN
INTRAMUSCULAR | Status: AC
  Filled 2023-12-14: qty 1

## 2023-12-14 MED ORDER — ACETAMINOPHEN 10 MG/ML IV SOLN: 1000.0000 mg | Freq: Once | INTRAVENOUS | Status: DC | PRN

## 2023-12-14 MED ORDER — LIDOCAINE HCL (CARDIAC) PF 100 MG/5ML IV SOSY
PREFILLED_SYRINGE | INTRAVENOUS | Status: DC | PRN
  Administered 2023-12-14: 80 mg via INTRAVENOUS

## 2023-12-14 MED ORDER — KETOROLAC TROMETHAMINE 30 MG/ML IJ SOLN
INTRAMUSCULAR | Status: DC | PRN
  Administered 2023-12-14: 15 mg via INTRAVENOUS

## 2023-12-14 MED ORDER — MIDAZOLAM HCL 2 MG/2ML IJ SOLN
INTRAMUSCULAR | Status: AC
Start: 2023-12-14 — End: 2023-12-14
  Filled 2023-12-14: qty 2

## 2023-12-14 MED ORDER — DROPERIDOL 2.5 MG/ML IJ SOLN: 0.6250 mg | Freq: Once | INTRAMUSCULAR | Status: DC | PRN

## 2023-12-14 MED ORDER — OXYCODONE-ACETAMINOPHEN 5-325 MG PO TABS
1.0000 | ORAL_TABLET | Freq: Three times a day (TID) | ORAL | 0 refills | Status: AC | PRN
  Filled 2023-12-14: qty 3, 1d supply, fill #0

## 2023-12-14 MED ORDER — PROPOFOL 10 MG/ML IV BOLUS
INTRAVENOUS | Status: AC
  Filled 2023-12-14: qty 20

## 2023-12-14 MED ORDER — ONDANSETRON HCL 4 MG/2ML IJ SOLN
INTRAMUSCULAR | Status: DC | PRN
  Administered 2023-12-14: 4 mg via INTRAVENOUS

## 2023-12-14 MED ORDER — OXYCODONE HCL 5 MG PO TABS: 5.0000 mg | ORAL_TABLET | Freq: Once | ORAL | Status: DC | PRN

## 2023-12-14 MED ORDER — PROPOFOL 1000 MG/100ML IV EMUL
INTRAVENOUS | Status: AC
Start: 2023-12-14 — End: 2023-12-14
  Filled 2023-12-14: qty 100

## 2023-12-14 MED ORDER — PROPOFOL 500 MG/50ML IV EMUL
INTRAVENOUS | Status: DC | PRN
Start: 2023-12-14 — End: 2023-12-14
  Administered 2023-12-14: 50 ug/kg/min via INTRAVENOUS

## 2023-12-14 SURGICAL SUPPLY — 21 items
BAG URINE DRAIN 2000ML AR STRL (UROLOGICAL SUPPLIES) ×2 IMPLANT
CATH URTH 16FR FL 2W BLN LF (CATHETERS) ×2 IMPLANT
DRSG TELFA 3X8 NADH STRL (GAUZE/BANDAGES/DRESSINGS) IMPLANT
ELECTRODE REM PT RTRN 9FT ADLT (ELECTROSURGICAL) ×2 IMPLANT
GAUZE 4X4 16PLY ~~LOC~~+RFID DBL (SPONGE) IMPLANT
GLOVE BIO SURGEON STRL SZ 6 (GLOVE) ×2 IMPLANT
GLOVE BIOGEL PI IND STRL 6 (GLOVE) ×2 IMPLANT
GOWN STRL REUS W/ TWL LRG LVL3 (GOWN DISPOSABLE) ×4 IMPLANT
IV LACTATED RINGERS 1000ML (IV SOLUTION) ×2 IMPLANT
KIT TURNOVER CYSTO (KITS) ×2 IMPLANT
MANIFOLD NEPTUNE II (INSTRUMENTS) ×2 IMPLANT
PACK DNC HYST (MISCELLANEOUS) ×2 IMPLANT
PAD OB MATERNITY 11 LF (PERSONAL CARE ITEMS) ×2 IMPLANT
PAD PREP OB/GYN DISP 24X41 (PERSONAL CARE ITEMS) ×2 IMPLANT
PENCIL SMOKE EVACUATOR (MISCELLANEOUS) IMPLANT
SCRUB CHG 4% DYNA-HEX 4OZ (MISCELLANEOUS) ×2 IMPLANT
SET CYSTO W/LG BORE CLAMP LF (SET/KITS/TRAYS/PACK) IMPLANT
SYR 10ML LL (SYRINGE) ×2 IMPLANT
TRAP FLUID SMOKE EVACUATOR (MISCELLANEOUS) ×2 IMPLANT
TUBING CONNECTING 10 (TUBING) ×2 IMPLANT
WATER STERILE IRR 500ML POUR (IV SOLUTION) ×2 IMPLANT

## 2023-12-14 NOTE — Transfer of Care (Addendum)
 Immediate Anesthesia Transfer of Care Note  Patient: Christy Whitehead  Procedure(s) Performed: POLYPECTOMY, CERVIX  Patient Location: PACU  Anesthesia Type:MAC  Level of Consciousness: awake and patient cooperative  Airway & Oxygen Therapy: Patient Spontanous Breathing and Patient connected to face mask oxygen  Post-op Assessment: Report given to RN and Post -op Vital signs reviewed and stable  Post vital signs: Reviewed and stable  Last Vitals:  Vitals Value Taken Time  BP 100/76 1024  Temp 97 1024  Pulse 70 1024  Resp 12 1024  SpO2 98 1024    Last Pain:  Vitals:   12/14/23 0754  PainSc: 0-No pain         Complications: There were no known notable events for this encounter.

## 2023-12-14 NOTE — Anesthesia Procedure Notes (Signed)
 Procedure Name: MAC Date/Time: 12/14/2023 9:32 AM  Performed by: Lorrene Camelia LABOR, CRNAPre-anesthesia Checklist: Patient identified, Emergency Drugs available, Suction available and Patient being monitored Patient Re-evaluated:Patient Re-evaluated prior to induction Oxygen Delivery Method: Simple face mask Preoxygenation: Pre-oxygenation with 100% oxygen Induction Type: IV induction

## 2023-12-14 NOTE — Anesthesia Postprocedure Evaluation (Signed)
 Anesthesia Post Note  Patient: Christy Whitehead  Procedure(s) Performed: POLYPECTOMY, CERVIX DILATION AND CURETTAGE  Patient location during evaluation: PACU Anesthesia Type: General Level of consciousness: awake and alert Pain management: pain level controlled Vital Signs Assessment: post-procedure vital signs reviewed and stable Respiratory status: spontaneous breathing, nonlabored ventilation and respiratory function stable Cardiovascular status: blood pressure returned to baseline and stable Postop Assessment: no apparent nausea or vomiting Anesthetic complications: no   There were no known notable events for this encounter.   Last Vitals:  Vitals:   12/14/23 1110 12/14/23 1112  BP:  108/71  Pulse: (!) 47   Resp: 17   Temp:    SpO2: 100%     Last Pain:  Vitals:   12/14/23 1100  PainSc: 0-No pain                 Camellia Merilee Louder

## 2023-12-14 NOTE — Anesthesia Preprocedure Evaluation (Addendum)
 Anesthesia Evaluation  Patient identified by MRN, date of birth, ID band Patient awake    Reviewed: Allergy & Precautions, H&P , NPO status , Patient's Chart, lab work & pertinent test results  Airway Mallampati: II  TM Distance: >3 FB Neck ROM: full    Dental no notable dental hx.    Pulmonary former smoker   Pulmonary exam normal        Cardiovascular negative cardio ROS Normal cardiovascular exam     Neuro/Psych  Headaches PSYCHIATRIC DISORDERS Anxiety Depression       GI/Hepatic negative GI ROS, Neg liver ROS,,,  Endo/Other  Hypothyroidism    Renal/GU      Musculoskeletal   Abdominal   Peds  Hematology negative hematology ROS (+)   Anesthesia Other Findings Past Medical History: 12/19/2008, 02/20/2009: Abnormal Pap smear of cervix     Comment:  ASCUS 08/06/2010, 10/01/2010: Abnormal Pap smear of cervix     Comment:  HGSIL 05/21/2023: Acute renal failure (HCC) No date: Anemia No date: Anxiety 06/05/2004: Cervical dysplasia CIN1 11/25/2023: Cervical polyp 12/20/2010: CIN III (cervical intraepithelial neoplasia grade III)  with severe dysplasia     Comment:  LEEP Ecto Cervix No date: Depression No date: Dysmenorrhea 11/25/2023: Dyspareunia, female 04/17/2023: Eye twitch 10/10/2021: Hashimoto's thyroiditis 07/09/2021: High grade squamous intraepithelial lesion (HGSIL), grade  3 CIN, on biopsy of cervix No date: Hypothyroidism No date: Migraines 11/25/2023: Postcoital and contact bleeding 05/21/2023: Pyelonephritis 05/21/2023: Sepsis (HCC) 03/06/2022: Vitamin D  deficiency  Past Surgical History: 2015: CHOLECYSTECTOMY 06/05/2004,02/20/2009,10/01/2010: COLPOSCOPY 12/20/2010: LEEP     Comment:  ECC &Endo Cx Negative, Ecto CIN 2-3  BMI    Body Mass Index: 25.24 kg/m      Reproductive/Obstetrics negative OB ROS                              Anesthesia Physical Anesthesia  Plan  ASA: 2  Anesthesia Plan: General   Post-op Pain Management: Ofirmev  IV (intra-op)* and Toradol  IV (intra-op)*   Induction: Intravenous  PONV Risk Score and Plan: Ondansetron , Treatment may vary due to age or medical condition, Propofol  infusion and TIVA  Airway Management Planned: Natural Airway  Additional Equipment:   Intra-op Plan:   Post-operative Plan:   Informed Consent: I have reviewed the patients History and Physical, chart, labs and discussed the procedure including the risks, benefits and alternatives for the proposed anesthesia with the patient or authorized representative who has indicated his/her understanding and acceptance.     Dental Advisory Given  Plan Discussed with: Anesthesiologist, CRNA and Surgeon  Anesthesia Plan Comments:          Anesthesia Quick Evaluation

## 2023-12-14 NOTE — Op Note (Signed)
 OPERATIVE NOTE DATE: 12/14/2023  PRE-OPERATIVE DIAGNOSIS:  1) cervical polyp; postcoital bleeding; cervical/vaginal pain  POST-OPERATIVE DIAGNOSIS:  Post-Op Diagnosis Codes:    * Cervical polyp [N84.1]    * Postcoital and contact bleeding [N93.0]    * Dyspareunia, female [N94.10]  OPERATION:  Cervical polypectomy w/ECC  SURGEON(S): Surgeons and Role:    * Leigh Sober, MD - Primary   ANESTHESIA: MAC  ESTIMATED BLOOD LOSS: 30 mL  OPERATIVE FINDINGS: Cervical polyp, friable cervix  SPECIMEN:  ID Type Source Tests Collected by Time Destination  1 : cervical polyp Tissue PATH Gyn biopsy SURGICAL PATHOLOGY Leigh Sober, MD 12/14/2023 [phone]   2 : endocervical curettage Tissue PATH Gyn biopsy SURGICAL PATHOLOGY Leigh Sober, MD 12/14/2023 [phone]     COMPLICATIONS: None  DRAINS: None  DISPOSITION: Stable to recovery room  DESCRIPTION OF PROCEDURE:      The patient was prepped and draped in the dorsal lithotomy position and placed under MAC sedation. A sterile speculum was inserted and the cervix was grasped with a single-toothed tenaculum. The polyp was grasped with ring forceps and using a twisting motion removed polyp in multiple pieces, was friable. Walking back with the ring forceps, there was an endocervical stalk that was friable, Bovie electrocautery was used to destroy the remaining stalk. A systematic curettage was performed of the endocervical canal. The tenaculum was removed from the cervix and hemostasis was noted. The weighted speculum was removed and the patient went to recovery room in stable condition.   Sober Leigh, DO [facility]

## 2023-12-14 NOTE — Interval H&P Note (Signed)
 History and Physical Interval Note:  12/14/2023 9:07 AM  Christy Whitehead  has presented today for surgery, with the diagnosis of cervical polyp; postcoital bleeding; cervical/vaginal pain.  The various methods of treatment have been discussed with the patient and family. After consideration of risks, benefits and other options for treatment, the patient has consented to  Procedure(s) with comments: POLYPECTOMY, CERVIX (N/A) - pt requesting MAC as a surgical intervention.  The patient's history has been reviewed, patient examined, no change in status, stable for surgery.  I have reviewed the patient's chart and labs.  Questions were answered to the patient's satisfaction.     [name]

## 2023-12-15 ENCOUNTER — Encounter: Payer: Self-pay | Admitting: Obstetrics

## 2023-12-15 LAB — SURGICAL PATHOLOGY

## 2023-12-16 ENCOUNTER — Other Ambulatory Visit: Payer: Self-pay | Admitting: Family Medicine

## 2023-12-16 ENCOUNTER — Other Ambulatory Visit: Payer: Self-pay

## 2023-12-16 NOTE — Telephone Encounter (Signed)
 Last seen on 11/04/22 No follow up scheduled   Needs to schedule a follow up visit.

## 2023-12-17 ENCOUNTER — Other Ambulatory Visit: Payer: Self-pay

## 2023-12-18 LAB — ABO/RH: ABO/RH(D): A POS

## 2023-12-28 ENCOUNTER — Other Ambulatory Visit: Payer: Self-pay | Admitting: Neurology

## 2023-12-28 ENCOUNTER — Other Ambulatory Visit: Payer: Self-pay

## 2023-12-28 ENCOUNTER — Telehealth: Payer: Self-pay | Admitting: Family Medicine

## 2023-12-28 DIAGNOSIS — B9689 Other specified bacterial agents as the cause of diseases classified elsewhere: Secondary | ICD-10-CM

## 2023-12-28 MED ORDER — METRONIDAZOLE 500 MG PO TABS
500.0000 mg | ORAL_TABLET | Freq: Two times a day (BID) | ORAL | 0 refills | Status: AC
  Filled 2023-12-28: qty 14, 7d supply, fill #0

## 2023-12-28 MED FILL — Levothyroxine Sodium Tab 125 MCG: ORAL | 30 days supply | Qty: 30 | Fill #4 | Status: CN

## 2023-12-28 NOTE — Telephone Encounter (Signed)
 Patient called to schedule follow up appointment to be able to get refill.

## 2023-12-29 ENCOUNTER — Other Ambulatory Visit: Payer: Self-pay

## 2023-12-29 MED FILL — Atogepant Tab 60 MG: ORAL | 90 days supply | Qty: 90 | Fill #0 | Status: AC

## 2023-12-29 MED FILL — Topiramate Tab 100 MG: ORAL | 30 days supply | Qty: 60 | Fill #0 | Status: AC

## 2023-12-29 NOTE — Telephone Encounter (Signed)
 Last seen on 11/04/22 Follow up scheduled on 03/09/24

## 2024-01-04 ENCOUNTER — Other Ambulatory Visit: Payer: Self-pay

## 2024-01-04 ENCOUNTER — Other Ambulatory Visit: Payer: Self-pay | Admitting: *Deleted

## 2024-01-04 MED ORDER — ONDANSETRON 4 MG PO TBDP
4.0000 mg | ORAL_TABLET | Freq: Three times a day (TID) | ORAL | 2 refills | Status: DC | PRN
  Filled 2024-01-04: qty 10, 4d supply, fill #0
  Filled 2024-03-03: qty 10, 4d supply, fill #1

## 2024-01-04 MED ORDER — SUMATRIPTAN SUCCINATE 100 MG PO TABS
100.0000 mg | ORAL_TABLET | Freq: Once | ORAL | 2 refills | Status: DC | PRN
  Filled 2024-01-04: qty 9, 30d supply, fill #0

## 2024-01-04 MED FILL — Levothyroxine Sodium Tab 125 MCG: ORAL | 30 days supply | Qty: 30 | Fill #4 | Status: AC

## 2024-01-04 NOTE — Telephone Encounter (Signed)
 Last seen on 11/04/22 Follow up scheduled on 03/09/24

## 2024-01-31 ENCOUNTER — Other Ambulatory Visit: Payer: Self-pay | Admitting: Nurse Practitioner

## 2024-01-31 MED FILL — Topiramate Tab 100 MG: ORAL | 30 days supply | Qty: 60 | Fill #1 | Status: AC

## 2024-02-01 ENCOUNTER — Other Ambulatory Visit: Payer: Self-pay

## 2024-02-01 ENCOUNTER — Other Ambulatory Visit: Payer: Self-pay | Admitting: Nurse Practitioner

## 2024-02-02 NOTE — Telephone Encounter (Signed)
 Patient is overdue for an appointment. Please call to schedule and then route to provider for refill.

## 2024-02-02 NOTE — Telephone Encounter (Signed)
 Requested medication (s) are due for refill today:   Yes for both  Requested medication (s) are on the active medication list:   Yes for both  Future visit scheduled:   No.    LOV 06/03/2023 with Dr. Herold   Last TSH 05/21/2023   Last ordered: Synthroid  07/31/2023 #60, 2 refills;   Zepbound  01/29/2023 2 ml, 12 refills  Unable to refill because no protocol assigned.   Criteria for TSH and LOV are met even though protocol is indicating they are not.      Requested Prescriptions  Pending Prescriptions Disp Refills   levothyroxine  (SYNTHROID ) 125 MCG tablet 60 tablet 2    Sig: Take 1 tablet (125 mcg total) by mouth daily.     Endocrinology:  Hypothyroid Agents Failed - 02/02/2024  2:24 PM      Failed - Valid encounter within last 12 months    Recent Outpatient Visits   None     Future Appointments             In 1 month Whitehead, Amy, NP [facility]            Passed - TSH in normal range and within 360 days    TSH  Date Value Ref Range Status  05/21/2023 4.067 0.350 - 4.500 uIU/mL Final    Comment:    Performed by a 3rd Generation assay with a functional sensitivity of <=0.01 uIU/mL. Performed at [facility], [address], [place], [place] [address]   04/17/2023 1.610 0.450 - 4.500 uIU/mL Final          ZEPBOUND  10 MG/0.5ML Pen [Pharmacy Med Name: tirzepatide  (ZEPBOUND ) 10 MG/0.5ML Pen] 2 mL 12    Sig: Inject 10 mg into the skin once a week.     Off-Protocol Failed - 02/02/2024  2:24 PM      Failed - Medication not assigned to a protocol, review manually.      Failed - Valid encounter within last 12 months    Recent Outpatient Visits   None     Future Appointments             In 1 month Whitehead, Amy, NP [facility]

## 2024-02-03 ENCOUNTER — Other Ambulatory Visit: Payer: Self-pay

## 2024-02-03 MED FILL — Tirzepatide (Weight Mngmt) Soln Auto-Injector 10 MG/0.5ML: SUBCUTANEOUS | 28 days supply | Qty: 2 | Fill #0 | Status: AC

## 2024-02-03 MED FILL — Levothyroxine Sodium Tab 125 MCG: ORAL | 60 days supply | Qty: 60 | Fill #0 | Status: CN

## 2024-02-03 NOTE — Patient Instructions (Signed)

## 2024-02-03 NOTE — Telephone Encounter (Signed)
 Called patient and left a message for her to call back to get scheduled.

## 2024-02-05 ENCOUNTER — Ambulatory Visit: Admitting: Nurse Practitioner

## 2024-02-05 ENCOUNTER — Encounter: Payer: Self-pay | Admitting: Nurse Practitioner

## 2024-02-05 VITALS — BP 108/69 | HR 65 | Temp 98.1°F | Resp 16 | Ht 64.02 in | Wt 156.2 lb

## 2024-02-05 DIAGNOSIS — E063 Autoimmune thyroiditis: Secondary | ICD-10-CM | POA: Diagnosis not present

## 2024-02-05 DIAGNOSIS — G43709 Chronic migraine without aura, not intractable, without status migrainosus: Secondary | ICD-10-CM

## 2024-02-05 DIAGNOSIS — F321 Major depressive disorder, single episode, moderate: Secondary | ICD-10-CM

## 2024-02-05 DIAGNOSIS — Z136 Encounter for screening for cardiovascular disorders: Secondary | ICD-10-CM | POA: Diagnosis not present

## 2024-02-05 DIAGNOSIS — E559 Vitamin D deficiency, unspecified: Secondary | ICD-10-CM | POA: Diagnosis not present

## 2024-02-05 DIAGNOSIS — F411 Generalized anxiety disorder: Secondary | ICD-10-CM | POA: Diagnosis not present

## 2024-02-05 DIAGNOSIS — Z6825 Body mass index (BMI) 25.0-25.9, adult: Secondary | ICD-10-CM

## 2024-02-05 DIAGNOSIS — D649 Anemia, unspecified: Secondary | ICD-10-CM | POA: Diagnosis not present

## 2024-02-05 DIAGNOSIS — Z13220 Encounter for screening for lipoid disorders: Secondary | ICD-10-CM | POA: Diagnosis not present

## 2024-02-05 NOTE — Assessment & Plan Note (Signed)
 Refer to depression plan of care.

## 2024-02-05 NOTE — Progress Notes (Signed)
 BP 108/69 (BP Location: Left Arm, Patient Position: Sitting)   Pulse 65   Temp 98.1 F (36.7 C) (Oral)   Resp 16   Ht 5' 4.02 (1.626 m)   Wt 156 lb 3.2 oz (70.9 kg)   LMP 10/18/2023 (Approximate)   SpO2 94% Comment: dark nail polish  BMI 26.80 kg/m    Subjective:    Patient ID: Christy Whitehead, female    DOB: [birth year], 38 y.o.   MRN: [id number]  HPI: Christy Whitehead is a 38 y.o. female  Chief Complaint  Patient presents with   Obesity    Zepbound  5 mg. Overall doing well, some concern for thinning hair, constipation using stool softeners.    Thyroid  Problem   Migraine    Normal currently.    Depression    Managing no current concerns.    HYPOTHYROIDISM Taking Levothyroxine  125 MCG daily. Continues Zepbound  5 MG as maintenance for weight loss, pays OOP.    Follows with neurology for migraines, has visit next month.  Taking Topamax , Imitrex , and Qulipta  + Zofran .  Had migraine last week due to running out of medication.  Has history of anemia and Vitamin D  deficiency Thyroid  control status:stable Satisfied with current treatment? yes Medication side effects: no Medication compliance: good compliance Etiology of hypothyroidism: Hashimoto's Recent dose adjustment:no Fatigue: yes Cold intolerance: yes Heat intolerance: no Weight gain: no Weight loss: no Constipation: yes Diarrhea/loose stools: no Palpitations: no Lower extremity edema: no Anxiety/depressed mood: no   ANXIETY/DEPRESSION Mood status: stable Depressed mood: no Anxious mood: no Anhedonia: no Significant weight loss or gain: no Insomnia: sometimes Fatigue: yes Feelings of worthlessness or guilt: no Impaired concentration/indecisiveness: no Suicidal ideations: no Hopelessness: no Crying spells: no    02/05/2024   10:39 AM 06/03/2023    1:20 PM 04/17/2023    9:15 AM 01/29/2023    8:37 AM 07/29/2022    9:58 AM  Depression screen PHQ 2/9  Decreased Interest 1 1 1 1  0  Down,  Depressed, Hopeless 0 0 0 1 0  PHQ - 2 Score 1 1 1 2  0  Altered sleeping 0 2 1 2 1   Tired, decreased energy 1 2 1 2 1   Change in appetite 0 1 0 1 1  Feeling bad or failure about yourself  0 0 0 0 0  Trouble concentrating 0 0 0 1 0  Moving slowly or fidgety/restless 0 0 0 0 0  Suicidal thoughts 0 0 0 0 0  PHQ-9 Score 2 6 3 8 3   Difficult doing work/chores Not difficult at all Not difficult at all Not difficult at all Somewhat difficult Not difficult at all       02/05/2024   10:40 AM 06/03/2023    1:20 PM 04/17/2023    9:15 AM 01/29/2023    8:38 AM  GAD 7 : Generalized Anxiety Score  Nervous, Anxious, on Edge 0 0 1 1  Control/stop worrying 0 0 1 1  Worry too much - different things 0 0 2 1  Trouble relaxing 1 1 1  0  Restless 1 1 2  0  Easily annoyed or irritable 0 2 2 1   Afraid - awful might happen 0 0 2 1  Total GAD 7 Score 2 4 11 5   Anxiety Difficulty Not difficult at all Somewhat difficult Somewhat difficult Somewhat difficult   Relevant past medical, surgical, family and social history reviewed and updated as indicated. Interim medical history since our last visit reviewed. Allergies and medications  reviewed and updated.  Review of Systems  Constitutional:  Negative for activity change, appetite change, diaphoresis, fatigue and fever.  Respiratory:  Negative for cough, chest tightness, shortness of breath and wheezing.   Cardiovascular:  Negative for chest pain, palpitations and leg swelling.  Gastrointestinal: Negative.   Endocrine: Positive for cold intolerance. Negative for heat intolerance.  Neurological: Negative.   Psychiatric/Behavioral: Negative.      Per HPI unless specifically indicated above     Objective:    BP 108/69 (BP Location: Left Arm, Patient Position: Sitting)   Pulse 65   Temp 98.1 F (36.7 C) (Oral)   Resp 16   Ht 5' 4.02 (1.626 m)   Wt 156 lb 3.2 oz (70.9 kg)   LMP 10/18/2023 (Approximate)   SpO2 94% Comment: dark nail polish  BMI 26.80  kg/m   Wt Readings from Last 3 Encounters:  02/05/24 156 lb 3.2 oz (70.9 kg)  12/14/23 147 lb 0.8 oz (66.7 kg)  12/07/23 147 lb (66.7 kg)    Physical Exam Vitals and nursing note reviewed.  Constitutional:      General: She is awake. She is not in acute distress.    Appearance: She is well-developed and well-groomed. She is not ill-appearing or toxic-appearing.  HENT:     Head: Normocephalic.     Right Ear: Hearing and external ear normal.     Left Ear: Hearing and external ear normal.  Eyes:     General: Lids are normal.        Right eye: No discharge.        Left eye: No discharge.     Conjunctiva/sclera: Conjunctivae normal.     Pupils: Pupils are equal, round, and reactive to light.  Neck:     Thyroid : No thyromegaly.     Vascular: No carotid bruit.  Cardiovascular:     Rate and Rhythm: Normal rate and regular rhythm.     Heart sounds: Normal heart sounds. No murmur heard.    No gallop.  Pulmonary:     Effort: Pulmonary effort is normal. No accessory muscle usage or respiratory distress.     Breath sounds: Normal breath sounds.  Abdominal:     General: Bowel sounds are normal. There is no distension.     Palpations: Abdomen is soft.     Tenderness: There is no abdominal tenderness.  Musculoskeletal:     Cervical back: Normal range of motion and neck supple.     Right lower leg: No edema.     Left lower leg: No edema.  Lymphadenopathy:     Cervical: No cervical adenopathy.  Skin:    General: Skin is warm and dry.  Neurological:     Mental Status: She is alert and oriented to person, place, and time.     Deep Tendon Reflexes: Reflexes are normal and symmetric.     Reflex Scores:      Brachioradialis reflexes are 2+ on the right side and 2+ on the left side.      Patellar reflexes are 2+ on the right side and 2+ on the left side. Psychiatric:        Attention and Perception: Attention normal.        Mood and Affect: Mood normal.        Speech: Speech normal.         Behavior: Behavior normal. Behavior is cooperative.        Thought Content: Thought content normal.     Results  for orders placed or performed during the hospital encounter of 12/14/23  Surgical pathology   Collection Time: 12/14/23 12:00 AM  Result Value Ref Range   SURGICAL PATHOLOGY      SURGICAL PATHOLOGY [facility] [address], [address] [place], [place] [address] Telephone [phone] or [phone] Fax [phone]  REPORT OF SURGICAL PATHOLOGY   Accession #: [id number] Patient Name: LILY, VELASQUEZ Visit # : [id number]  MRN: [id number] Physician: Leigh Sober DOB/Age [birth year] (Age: 43) Gender: F Collected Date: 12/14/2023 Received Date: 12/14/2023  FINAL DIAGNOSIS       1. Polyp, cervical :       BENIGN ENDOCERVICAL POLYP AND ABUNDANT MUCUS.       2. Endocervix, curettage,  :       ABUNDANT MUCUS AND A FEW BENIGN ENDOCERVICAL CELLS.       ELECTRONIC SIGNATURE : Belvie Come, John, Pathologist, Electronic Signature  MICROSCOPIC DESCRIPTION  CASE COMMENTS STAINS USED IN DIAGNOSIS: H&E H&E H&E    CLINICAL HISTORY  SPECIMEN(S) OBTAINED 1. Polyp, Cervical 2. Endocervix, curettage,  SPECIMEN COMMENTS: SPECIMEN CLINICAL INFORMATION: 1. Cervical polyp, postcoital bleeding, cervical/vaginal pai n    Gross Description 1. Cervical polyp, received fresh on 3 Telfa pads is a 3.0 x 2.0 x 1.1 cm aggregate of gelatinous, hemorrhagic material admixed with a mild amount of soft tissue fragments.The specimen is submitted in toto in 2 blocks (1A-B). 2. Endocervical curettage, received fresh on two bloodstained Telfa pads os a scant amount soft tissue.Each pad is wiped with a formalin-soaked sponge; sponges are submitted in toto in 1 block (2A).; the specimen may not survive processing.      [facility] 12/14/2023        Report signed out from the following location(s) [place] [address] [place], [place] [address] CLIA #: [id number]  [facility] [address] [place], [place] [address] CLIA #: [id number]   Pregnancy, urine POC   Collection Time: 12/14/23  8:04 AM  Result Value Ref Range   Preg Test, Ur NEGATIVE NEGATIVE  ABO/Rh   Collection Time: 12/14/23 11:02 AM  Result Value Ref Range   ABO/RH(D)      A POS Performed at [facility], [address] [place], [place] [address]       Assessment & Plan:   Problem List Items Addressed This Visit       Cardiovascular and Mediastinum   Migraines   Chronic, stable.  Continue collaboration with neurology and current regimen as ordered by them.  Recent note reviewed.      Relevant Orders   Comprehensive metabolic panel with GFR     Endocrine   Hashimoto's thyroiditis   Chronic, ongoing.  Will recheck today to ensure ongoing stable levels and continue current dosing, unless labs show need to change.       Relevant Orders   T4, free   TSH     Other   Vitamin D  deficiency   Ongoing, is taking supplement.  Continue this and check Vit D level today.      Relevant Orders   VITAMIN D  25 Hydroxy (Vit-D Deficiency, Fractures)   Normocytic anemia   Noted on past labs, recheck levels today and start supplement as needed.      Relevant Orders   CBC with Differential/Platelet   Ferritin   Iron   Generalized anxiety disorder   Refer to depression plan of care.  Depression, major, single episode, moderate (HCC) - Primary   Chronic, ongoing.  Denies SI/HI.  Currently no medications, initiate as needed.      BMI 25.0-25.9,adult   Continues on Zepbound , has reached weight loss goal.  Pays OOP for Zepbound , will send in refills of 10 MG (she will split to = 5 MG weekly), however if ongoing weight loss or becomes underweight will recommend stopping or lowering this.      Other Visit Diagnoses       Encounter for lipid screening for cardiovascular disease       Lipid panel  today.   Relevant Orders   Comprehensive metabolic panel with GFR   Lipid Panel w/o Chol/HDL Ratio        Follow up plan: Return in about 6 months (around 08/04/2024) for Hypothyroid.

## 2024-02-05 NOTE — Assessment & Plan Note (Signed)
 Chronic, stable.  Continue collaboration with neurology and current regimen as ordered by them.  Recent note reviewed.

## 2024-02-05 NOTE — Assessment & Plan Note (Signed)
Continues on Zepbound, has reached weight loss goal.  Pays OOP for Zepbound, will send in refills of 10 MG (she will split to = 5 MG weekly), however if ongoing weight loss or becomes underweight will recommend stopping or lowering this.

## 2024-02-05 NOTE — Assessment & Plan Note (Signed)
 Ongoing, is taking supplement.  Continue this and check Vit D level today.

## 2024-02-05 NOTE — Assessment & Plan Note (Signed)
 Chronic, ongoing.  Will recheck today to ensure ongoing stable levels and continue current dosing, unless labs show need to change.

## 2024-02-05 NOTE — Assessment & Plan Note (Signed)
 Noted on past labs, recheck levels today and start supplement as needed.

## 2024-02-05 NOTE — Assessment & Plan Note (Signed)
 Chronic, ongoing.  Denies SI/HI.  Currently no medications, initiate as needed.

## 2024-02-06 ENCOUNTER — Ambulatory Visit: Payer: Self-pay | Admitting: Nurse Practitioner

## 2024-02-06 ENCOUNTER — Other Ambulatory Visit: Payer: Self-pay

## 2024-02-06 LAB — COMPREHENSIVE METABOLIC PANEL WITH GFR
ALT: 11 IU/L (ref 0–32)
AST: 11 IU/L (ref 0–40)
Albumin: 3.7 g/dL — ABNORMAL LOW (ref 3.9–4.9)
Alkaline Phosphatase: 43 IU/L — ABNORMAL LOW (ref 44–121)
BUN/Creatinine Ratio: 17 (ref 9–23)
BUN: 13 mg/dL (ref 6–20)
Bilirubin Total: <0.2 mg/dL (ref 0.0–1.2)
CO2: 17 mmol/L — ABNORMAL LOW (ref 20–29)
Calcium: 8.8 mg/dL (ref 8.7–10.2)
Chloride: 109 mmol/L — ABNORMAL HIGH (ref 96–106)
Creatinine, Ser: 0.75 mg/dL (ref 0.57–1.00)
Globulin, Total: 2.5 g/dL (ref 1.5–4.5)
Glucose: 83 mg/dL (ref 70–99)
Potassium: 3.8 mmol/L (ref 3.5–5.2)
Sodium: 138 mmol/L (ref 134–144)
Total Protein: 6.2 g/dL (ref 6.0–8.5)
eGFR: 105 mL/min/1.73 (ref >59)

## 2024-02-06 LAB — LIPID PANEL W/O CHOL/HDL RATIO
Cholesterol, Total: 166 mg/dL (ref 100–199)
HDL: 51 mg/dL (ref >39)
LDL Chol Calc (NIH): 90 mg/dL (ref 0–99)
Triglycerides: 141 mg/dL (ref 0–149)
VLDL Cholesterol Cal: 25 mg/dL (ref 5–40)

## 2024-02-06 LAB — CBC WITH DIFFERENTIAL/PLATELET
Basophils Absolute: 0.1 x10E3/uL (ref 0.0–0.2)
Basos: 1 %
EOS (ABSOLUTE): 0.3 x10E3/uL (ref 0.0–0.4)
Eos: 3 %
Hematocrit: 37.6 % (ref 34.0–46.6)
Hemoglobin: 12.3 g/dL (ref 11.1–15.9)
Immature Grans (Abs): 0 x10E3/uL (ref 0.0–0.1)
Immature Granulocytes: 0 %
Lymphocytes Absolute: 4.3 x10E3/uL — ABNORMAL HIGH (ref 0.7–3.1)
Lymphs: 41 %
MCH: 30.1 pg (ref 26.6–33.0)
MCHC: 32.7 g/dL (ref 31.5–35.7)
MCV: 92 fL (ref 79–97)
Monocytes Absolute: 0.6 x10E3/uL (ref 0.1–0.9)
Monocytes: 6 %
Neutrophils Absolute: 5.1 x10E3/uL (ref 1.4–7.0)
Neutrophils: 49 %
Platelets: 319 x10E3/uL (ref 150–450)
RBC: 4.09 x10E6/uL (ref 3.77–5.28)
RDW: 11.9 % (ref 11.7–15.4)
WBC: 10.4 x10E3/uL (ref 3.4–10.8)

## 2024-02-06 LAB — IRON: Iron: 65 ug/dL (ref 27–159)

## 2024-02-06 LAB — VITAMIN D 25 HYDROXY (VIT D DEFICIENCY, FRACTURES): Vit D, 25-Hydroxy: 41.1 ng/mL (ref 30.0–100.0)

## 2024-02-06 LAB — T4, FREE: Free T4: 1.09 ng/dL (ref 0.82–1.77)

## 2024-02-06 LAB — FERRITIN: Ferritin: 22 ng/mL (ref 15–150)

## 2024-02-06 LAB — TSH: TSH: 8.3 u[IU]/mL — ABNORMAL HIGH (ref 0.450–4.500)

## 2024-02-06 MED ORDER — LEVOTHYROXINE SODIUM 150 MCG PO TABS
150.0000 ug | ORAL_TABLET | Freq: Every day | ORAL | 0 refills | Status: DC
  Filled 2024-02-06: qty 90, 90d supply, fill #0

## 2024-02-06 NOTE — Progress Notes (Signed)
 Contacted via MyChart -- needs lab only visit in 6 weeks  Good morning Christy Whitehead, your labs have returned and are overall stable with exception of elevation in TSH, which I suspected based on your symptoms at present.  I am sending an increase in your Levothyroxine  to 150 MCG daily, stop 125 MCG dosing once you have the 150.  Then in 6 weeks we will recheck levels outpatient.  Any questions? Keep being amazing!!  Thank you for allowing me to participate in your care.  I appreciate you. Kindest regards, Sequoia Witz

## 2024-02-08 NOTE — Progress Notes (Signed)
 Scheduled

## 2024-03-03 ENCOUNTER — Other Ambulatory Visit: Payer: Self-pay

## 2024-03-03 MED FILL — Topiramate Tab 100 MG: ORAL | 30 days supply | Qty: 60 | Fill #2 | Status: AC

## 2024-03-04 ENCOUNTER — Other Ambulatory Visit: Payer: Self-pay

## 2024-03-08 NOTE — Patient Instructions (Signed)
 Below is our plan:  We will continue Qulipta  daily, topiramate  100mg  BID and Sumatriptan  and or ondansetron  PRN  Please make sure you are staying well hydrated. I recommend 50-60 ounces daily. Well balanced diet and regular exercise encouraged. Consistent sleep schedule with 6-8 hours recommended.   Please continue follow up with care team as directed.   Follow up with me in 1 year   You may receive a survey regarding today's visit. I encourage you to leave honest feed back as I do use this information to improve patient care. Thank you for seeing me today!   GENERAL HEADACHE INFORMATION:   Natural supplements: Magnesium  Oxide or Magnesium  Glycinate 500 mg at bed (up to 800 mg daily) Coenzyme Q10 300 mg in AM Vitamin B2- 200 mg twice a day   Add 1 supplement at a time since even natural supplements can have undesirable side effects. You can sometimes buy supplements cheaper (especially Coenzyme Q10) at www.WebmailGuide.co.za or at [facility].  Migraine with aura: There is increased risk for stroke in women with migraine with aura and a contraindication for the combined contraceptive pill for use by women who have migraine with aura. The risk for women with migraine without aura is lower. However other risk factors like smoking are far more likely to increase stroke risk than migraine. There is a recommendation for no smoking and for the use of OCPs without estrogen such as progestogen only pills particularly for women with migraine with aura.SABRA People who have migraine headaches with auras may be 3 times more likely to have a stroke caused by a blood clot, compared to migraine patients who don't see auras. Women who take hormone-replacement therapy may be 30 percent more likely to suffer a clot-based stroke than women not taking medication containing estrogen. Other risk factors like smoking and high blood pressure may be  much more important.    Vitamins and herbs that show potential:   Magnesium :  Magnesium  (250 mg twice a day or 500 mg at bed) has a relaxant effect on smooth muscles such as blood vessels. Individuals suffering from frequent or daily headache usually have low magnesium  levels which can be increase with daily supplementation of 400-750 mg. Three trials found 40-90% average headache reduction  when used as a preventative. Magnesium  may help with headaches are aura, the best evidence for magnesium  is for migraine with aura is its thought to stop the cortical spreading depression we believe is the pathophysiology of migraine aura.Magnesium  also demonstrated the benefit in menstrually related migraine.  Magnesium  is part of the messenger system in the serotonin cascade and it is a good muscle relaxant.  It is also useful for constipation which can be a side effect of other medications used to treat migraine. Good sources include nuts, whole grains, and tomatoes. Side Effects: loose stool/diarrhea  Riboflavin (vitamin B 2) 200 mg twice a day. This vitamin assists nerve cells in the production of ATP a principal energy storing molecule.  It is necessary for many chemical reactions in the body.  There have been at least 3 clinical trials of riboflavin using 400 mg per day all of which suggested that migraine frequency can be decreased.  All 3 trials showed significant improvement in over half of migraine sufferers.  The supplement is found in bread, cereal, milk, meat, and poultry.  Most Americans get more riboflavin than the recommended daily allowance, however riboflavin deficiency is not necessary for the supplements to help prevent headache. Side effects: energizing, green  urine   Coenzyme Q10: This is present in almost all cells in the body and is critical component for the conversion of energy.  Recent studies have shown that a nutritional supplement of CoQ10 can reduce the frequency of migraine attacks by improving the energy production of cells as with riboflavin.  Doses of 150 mg twice a  day have been shown to be effective.   Melatonin: Increasing evidence shows correlation between melatonin secretion and headache conditions.  Melatonin supplementation has decreased headache intensity and duration.  It is widely used as a sleep aid.  Sleep is natures way of dealing with migraine.  A dose of 3 mg is recommended to start for headaches including cluster headache. Higher doses up to 15 mg has been reviewed for use in Cluster headache and have been used. The rationale behind using melatonin for cluster is that many theories regarding the cause of Cluster headache center around the disruption of the normal circadian rhythm in the brain.  This helps restore the normal circadian rhythm.   HEADACHE DIET: Foods and beverages which may trigger migraine Note that only 20% of headache patients are food sensitive. You will know if you are food sensitive if you get a headache consistently 20 minutes to 2 hours after eating a certain food. Only cut out a food if it causes headaches, otherwise you might remove foods you enjoy! What matters most for diet is to eat a well balanced healthy diet full of vegetables and low fat protein, and to not miss meals.   Chocolate, other sweets ALL cheeses except cottage and cream cheese Dairy products, yogurt, sour cream, ice cream Liver Meat extracts (Bovril, Marmite, meat tenderizers) Meats or fish which have undergone aging, fermenting, pickling or smoking. These include: Hotdogs,salami,Lox,sausage, mortadellas,smoked salmon, pepperoni, Pickled herring Pods of broad bean (English beans, Chinese pea pods, [place] (fava) beans, lima and navy beans Ripe avocado, ripe banana Yeast extracts or active yeast preparations such as Brewer's or Fleishman's (commercial bakes goods are permitted) Tomato based foods, pizza (lasagna, etc.)   MSG (monosodium glutamate) is disguised as many things; look for these common aliases: Monopotassium glutamate Autolysed  yeast Hydrolysed protein Sodium caseinate "flavorings" "all natural preservatives Nutrasweet   Avoid all other foods that convincingly provoke headaches.   Resources: The Dizzy Bluford Aid Your Headache Diet, migrainestrong.com  https://zamora-andrews.com/   Caffeine and Migraine For patients that have migraine, caffeine intake more than 3 days per week can lead to dependency and increased migraine frequency. I would recommend cutting back on your caffeine intake as best you can. The recommended amount of caffeine is 200-300 mg daily, although migraine patients may experience dependency at even lower doses. While you may notice an increase in headache temporarily, cutting back will be helpful for headaches in the long run. For more information on caffeine and migraine, visit: https://americanmigrainefoundation.org/resource-library/caffeine-and-migraine/   Headache Prevention Strategies:   1. Maintain a headache diary; learn to identify and avoid triggers.  - This can be a simple note where you log when you had a headache, associated symptoms, and medications used - There are several smartphone apps developed to help track migraines: Migraine Buddy, Migraine Monitor, Curelator N1-Headache App   Common triggers include: Emotional triggers: Emotional/Upset family or friends Emotional/Upset occupation Business reversal/success Anticipation anxiety Crisis-serious Post-crisis periodNew job/position   Physical triggers: Vacation Day Weekend Strenuous Exercise High Altitude Location New Move Menstrual Day Physical Illness Oversleep/Not enough sleep Weather changes Light: Photophobia or light sesnitivity treatment involves a balance between  desensitization and reduction in overly strong input. Use dark polarized glasses outside, but not inside. Avoid bright or fluorescent light, but do not dim environment to the point that going into a  normally lit room hurts. Consider FL-41 tint lenses, which reduce the most irritating wavelengths without blocking too much light.  These can be obtained at axonoptics.com or theraspecs.com Foods: see list above.   2. Limit use of acute treatments (over-the-counter medications, triptans, etc.) to no more than 2 days per week or 10 days per month to prevent medication overuse headache (rebound headache).     3. Follow a regular schedule (including weekends and holidays): Don't skip meals. Eat a balanced diet. 8 hours of sleep nightly. Minimize stress. Exercise 30 minutes per day. Being overweight is associated with a 5 times increased risk of chronic migraine. Keep well hydrated and drink 6-8 glasses of water  per day.   4. Initiate non-pharmacologic measures at the earliest onset of your headache. Rest and quiet environment. Relax and reduce stress. Breathe2Relax is a free app that can instruct you on    some simple relaxtion and breathing techniques. Http://Dawnbuse.com is a    free website that provides teaching videos on relaxation.  Also, there are  many apps that   can be downloaded for "mindful" relaxation.  An app called YOGA NIDRA will help walk you through mindfulness. Another app called Calm can be downloaded to give you a structured mindfulness guide with daily reminders and skill development. Headspace for guided meditation Mindfulness Based Stress Reduction Online Course: www.palousemindfulness.com Cold compresses.   5. Don't wait!! Take the maximum allowable dosage of prescribed medication at the first sign of migraine.   6. Compliance:  Take prescribed medication regularly as directed and at the first sign of a migraine.   7. Communicate:  Call your physician when problems arise, especially if your headaches change, increase in frequency/severity, or become associated with neurological symptoms (weakness, numbness, slurred speech, etc.). Proceed to emergency room if you experience  new or worsening symptoms or symptoms do not resolve, if you have new neurologic symptoms or if headache is severe, or for any concerning symptom.   8. Headache/pain management therapies: Consider various complementary methods, including medication, behavioral therapy, psychological counselling, biofeedback, massage therapy, acupuncture, dry needling, and other modalities.  Such measures may reduce the need for medications. Counseling for pain management, where patients learn to function and ignore/minimize their pain, seems to work very well.   9. Recommend changing family's attention and focus away from patient's headaches. Instead, emphasize daily activities. If first question of day is 'How are your headaches/Do you have a headache today?', then patient will constantly think about headaches, thus making them worse. Goal is to re-direct attention away from headaches, toward daily activities and other distractions.   10. Helpful Websites: www.AmericanHeadacheSociety.org PatentHood.ch www.headaches.org TightMarket.nl www.achenet.org

## 2024-03-08 NOTE — Progress Notes (Unsigned)
 PATIENT: Christy Whitehead DOB: 1986/03/10  REASON FOR VISIT: follow up HISTORY FROM: patient  Virtual Visit via Telephone Note  I connected with Christy Whitehead on 03/09/24 at  9:45 AM EDT by telephone and verified that I am speaking with the correct person using two identifiers.   I discussed the limitations, risks, security and privacy concerns of performing an evaluation and management service by telephone and the availability of in person appointments. I also discussed with the patient that there may be a patient responsible charge related to this service. The patient expressed understanding and agreed to proceed.   History of Present Illness:  03/09/24 ALL (Mychart): Christy Whitehead returns for follow up for migraines. She was last seen 10/2022 and doing well on Qulipta  60mg  QD, topiramate  100mg  BID and sumatriptan  PRN. Since, she reports doing fairly well. She may have 2-3 migraine days a month. She is tolerating meds well. Sumtriptan usually helps. She pairs with Tylenol , ibuprofen  and ondansetron . She does have twitching of right eye. She is due for eye exam. 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 No vision changes or weakness of lid.   11/04/2022 ALL (Mychart):  Christy Whitehead is a 38 y.o. female here today for follow up for migraines. She continues Qulipta  60mg  QD, topiramate  100mg  BID and sumatriptan  PRN. She feels migraines are well managed. She may have 8-10 mild headaches per month with 2-3 migrainous headaches. Sumatriptan  works well. Ibuprofen  or Tylenol  help with milder headaches. She contineus to work nights. She continues Nuvaring for contraception.   10/24/21 ALL: Christy Whitehead returns for follow up for migraines. She was last seen 08/2020 and switched from Emgality  to Qulipta  due to skin irritation and continued on topiramate  100mg  BID. Rizatriptan  was switched to sumatriptan  for abortive therapy. Since, she feels that she is doing much better. She was  having daily headaches, now having about 8 per month. Most are mild and aborted with Tylenol . Sumatriptan  helps with severe migraines. Usually takes 1-2 doses per month. Ondansetron  helps with nausea. Stress is common trigger. She is exercising multiple times per week. 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    Observations/Objective:  Generalized: Well developed, in no acute distress  Mentation: Alert oriented to time, place, history taking. Follows all commands speech and language fluent   Assessment and Plan:  38 y.o. year old female  has a past medical history of Abnormal Pap smear of cervix (12/19/2008, 02/20/2009), Abnormal Pap smear of cervix (08/06/2010, 10/01/2010), Acute renal failure (05/21/2023), Anemia, Anxiety, Cervical dysplasia CIN1 (06/05/2004), Cervical polyp (11/25/2023), CIN III (cervical intraepithelial neoplasia grade III) with severe dysplasia (12/20/2010), Depression, Dysmenorrhea, Dyspareunia, female (11/25/2023), Eye twitch (04/17/2023), Hashimoto's thyroiditis (10/10/2021), High grade squamous intraepithelial lesion (HGSIL), grade 3 CIN, on biopsy of cervix (07/09/2021), Hypothyroidism, Migraines, Postcoital and contact bleeding (11/25/2023), Pyelonephritis (05/21/2023), Sepsis (HCC) (05/21/2023), and Vitamin D  deficiency (03/06/2022). here with    ICD-10-CM   1. Migraine without aura and without status migrainosus, not intractable  G43.009       Christy Whitehead continues to do well on current treatment plan. We will continue Qulipta  60mg  daily, topiramate  100mg  BID and sumatriptan  PRN. Healthy lifestyle habits encouraged. She was advised against pregnancy while taking Qulipta  and topiramate . She will follow up in 1 year, sooner if needed.   No orders of the defined types were placed in this encounter.   Meds ordered this encounter  Medications   ondansetron  (ZOFRAN -ODT) 4 MG disintegrating tablet    Sig: Take  1 tablet (4 mg total) by mouth every 8 (eight) hours as needed for nausea or  vomiting.    Dispense:  10 tablet    Refill:  11    Supervising Provider:   YAN, YIJUN [[id number]]   SUMAtriptan  (IMITREX ) 100 MG tablet    Sig: Take 1 tablet (100 mg total) by mouth once as needed for up to 1 dose for migraine. May repeat in 2 hours if headache persists or recurs.    Dispense:  9 tablet    Refill:  11    Supervising Provider:   YAN, YIJUN [[id number]]   topiramate  (TOPAMAX ) 100 MG tablet    Sig: Take 1 tablet (100 mg total) by mouth 2 (two) times daily. Must be seen for further refills.    Dispense:  180 tablet    Refill:  3    Supervising Provider:   YAN, YIJUN [[id number]]   Atogepant  (QULIPTA ) 60 MG TABS    Sig: Take 1 tablet (60 mg) by mouth daily.    Dispense:  90 tablet    Refill:  3    Supervising Provider:   YAN, YIJUN [[id number]]     Follow Up Instructions:  I discussed the assessment and treatment plan with the patient. The patient was provided an opportunity to ask questions and all were answered. The patient agreed with the plan and demonstrated an understanding of the instructions.   The patient was advised to call back or seek an in-person evaluation if the symptoms worsen or if the condition fails to improve as anticipated.  I provided 15 minutes of non-face-to-face time during this encounter. Patient located at their place of residence during Mychart visit. Provider is in the office.    Derold Dorsch, NP

## 2024-03-09 ENCOUNTER — Other Ambulatory Visit: Payer: Self-pay

## 2024-03-09 ENCOUNTER — Telehealth (INDEPENDENT_AMBULATORY_CARE_PROVIDER_SITE_OTHER): Admitting: Family Medicine

## 2024-03-09 ENCOUNTER — Encounter: Payer: Self-pay | Admitting: Family Medicine

## 2024-03-09 ENCOUNTER — Other Ambulatory Visit (HOSPITAL_COMMUNITY): Payer: Self-pay

## 2024-03-09 DIAGNOSIS — G43009 Migraine without aura, not intractable, without status migrainosus: Secondary | ICD-10-CM | POA: Diagnosis not present

## 2024-03-09 MED ORDER — SUMATRIPTAN SUCCINATE 100 MG PO TABS
100.0000 mg | ORAL_TABLET | Freq: Once | ORAL | 11 refills | Status: AC | PRN
  Filled 2024-03-09: qty 9, 15d supply, fill #0
  Filled 2024-06-13: qty 9, 30d supply, fill #0
  Filled 2024-06-14 (×2): qty 8, 27d supply, fill #0
  Filled 2024-06-14: qty 1, 3d supply, fill #0

## 2024-03-09 MED ORDER — TOPIRAMATE 100 MG PO TABS
100.0000 mg | ORAL_TABLET | Freq: Two times a day (BID) | ORAL | 3 refills | Status: AC
  Filled 2024-03-09 – 2024-04-05 (×2): qty 180, 90d supply, fill #0
  Filled 2024-06-26: qty 180, 90d supply, fill #1

## 2024-03-09 MED ORDER — ONDANSETRON 4 MG PO TBDP
4.0000 mg | ORAL_TABLET | Freq: Three times a day (TID) | ORAL | 11 refills | Status: AC | PRN
  Filled 2024-03-09 – 2024-04-05 (×2): qty 10, 4d supply, fill #0
  Filled 2024-04-27 – 2024-06-13 (×2): qty 10, 4d supply, fill #1

## 2024-03-09 MED ORDER — QULIPTA 60 MG PO TABS
60.0000 mg | ORAL_TABLET | Freq: Every day | ORAL | 3 refills | Status: AC
  Filled 2024-03-09 – 2024-04-05 (×3): qty 90, 90d supply, fill #0
  Filled 2024-06-26: qty 90, 90d supply, fill #1

## 2024-03-10 ENCOUNTER — Other Ambulatory Visit: Payer: Self-pay

## 2024-03-19 ENCOUNTER — Other Ambulatory Visit: Payer: Self-pay

## 2024-03-21 ENCOUNTER — Other Ambulatory Visit

## 2024-03-21 DIAGNOSIS — R7989 Other specified abnormal findings of blood chemistry: Secondary | ICD-10-CM | POA: Diagnosis not present

## 2024-03-22 ENCOUNTER — Ambulatory Visit: Payer: Self-pay | Admitting: Nurse Practitioner

## 2024-03-22 LAB — TSH: TSH: 0.702 u[IU]/mL (ref 0.450–4.500)

## 2024-03-22 NOTE — Progress Notes (Signed)
 Contacted via MyChart  TSH improved this check.  Great news.  Maintain current Levothyroxine  dosing.  Any questions? Keep being awesome!!  Thank you for allowing me to participate in your care.  I appreciate you. Kindest regards, Talmadge Ganas

## 2024-04-05 ENCOUNTER — Other Ambulatory Visit: Payer: Self-pay

## 2024-04-27 ENCOUNTER — Other Ambulatory Visit: Payer: Self-pay

## 2024-04-27 ENCOUNTER — Other Ambulatory Visit: Payer: Self-pay | Admitting: Nurse Practitioner

## 2024-04-28 ENCOUNTER — Other Ambulatory Visit: Payer: Self-pay | Admitting: Nurse Practitioner

## 2024-04-28 ENCOUNTER — Other Ambulatory Visit: Payer: Self-pay

## 2024-04-29 ENCOUNTER — Other Ambulatory Visit: Payer: Self-pay

## 2024-05-02 ENCOUNTER — Other Ambulatory Visit: Payer: Self-pay

## 2024-05-02 MED FILL — Levothyroxine Sodium Tab 150 MCG: ORAL | 90 days supply | Qty: 90 | Fill #0 | Status: AC

## 2024-05-02 MED FILL — Tirzepatide (Weight Mngmt) Soln Auto-Injector 10 MG/0.5ML: SUBCUTANEOUS | 28 days supply | Qty: 2 | Fill #0 | Status: AC

## 2024-05-02 NOTE — Telephone Encounter (Signed)
 Requested medication (s) are due for refill today - yes  Requested medication (s) are on the active medication list -yes  Future visit scheduled -no  Last refill: 02/08/24 2ml  Notes to clinic: off protocol- provider review   Requested Prescriptions  Pending Prescriptions Disp Refills   ZEPBOUND  10 MG/0.5ML Pen [Pharmacy Med Name: tirzepatide  (ZEPBOUND ) 10 MG/0.5ML Pen] 2 mL 0    Sig: Inject 10 mg into the skin every Monday.     Off-Protocol Failed - 05/02/2024 11:31 AM      Failed - Medication not assigned to a protocol, review manually.      Passed - Valid encounter within last 12 months    Recent Outpatient Visits           2 months ago Depression, major, single episode, moderate (HCC)   [place] [facility] [place], [place] T, NP       Future Appointments             In 10 months Lomax, Amy, NP [place] [facility]            Signed Prescriptions Disp Refills   levothyroxine  (SYNTHROID ) 150 MCG tablet 90 tablet 0    Sig: Take 1 tablet (150 mcg total) by mouth daily.     Endocrinology:  Hypothyroid Agents Passed - 05/02/2024 11:31 AM      Passed - TSH in normal range and within 360 days    TSH  Date Value Ref Range Status  03/21/2024 0.702 0.450 - 4.500 uIU/mL Final         Passed - Valid encounter within last 12 months    Recent Outpatient Visits           2 months ago Depression, major, single episode, moderate (HCC)   [place] [facility] [place], Melanie DASEN, NP       Future Appointments             In 10 months Lomax, Amy, NP [place] [facility]               Requested Prescriptions  Pending Prescriptions Disp Refills   ZEPBOUND  10 MG/0.5ML Pen [Pharmacy Med Name: tirzepatide  (ZEPBOUND ) 10 MG/0.5ML Pen] 2 mL 0    Sig: Inject 10 mg into the skin every Monday.     Off-Protocol Failed - 05/02/2024 11:31 AM      Failed - Medication not assigned to a protocol, review  manually.      Passed - Valid encounter within last 12 months    Recent Outpatient Visits           2 months ago Depression, major, single episode, moderate (HCC)   [facility] [place], [place] T, NP       Future Appointments             In 10 months Lomax, Amy, NP [place] [facility]            Signed Prescriptions Disp Refills   levothyroxine  (SYNTHROID ) 150 MCG tablet 90 tablet 0    Sig: Take 1 tablet (150 mcg total) by mouth daily.     Endocrinology:  Hypothyroid Agents Passed - 05/02/2024 11:31 AM      Passed - TSH in normal range and within 360 days    TSH  Date Value Ref Range Status  03/21/2024 0.702 0.450 - 4.500 uIU/mL Final         Passed - Valid  encounter within last 12 months    Recent Outpatient Visits           2 months ago Depression, major, single episode, moderate (HCC)    [facility] [place], Melanie DASEN, NP       Future Appointments             In 10 months Lomax, Amy, NP [facility]

## 2024-05-02 NOTE — Telephone Encounter (Signed)
 Requested Prescriptions  Pending Prescriptions Disp Refills   levothyroxine  (SYNTHROID ) 150 MCG tablet 90 tablet 0    Sig: Take 1 tablet (150 mcg total) by mouth daily.     Endocrinology:  Hypothyroid Agents Passed - 05/02/2024 11:30 AM      Passed - TSH in normal range and within 360 days    TSH  Date Value Ref Range Status  03/21/2024 0.702 0.450 - 4.500 uIU/mL Final         Passed - Valid encounter within last 12 months    Recent Outpatient Visits           2 months ago Depression, major, single episode, moderate (HCC)   [place] [facility] [place], [place] T, NP       Future Appointments             In 10 months Lomax, Amy, NP [place] [facility]             ZEPBOUND  10 MG/0.5ML Pen [Pharmacy Med Name: tirzepatide  (ZEPBOUND ) 10 MG/0.5ML Pen] 2 mL 0    Sig: Inject 10 mg into the skin every Monday.     Off-Protocol Failed - 05/02/2024 11:30 AM      Failed - Medication not assigned to a protocol, review manually.      Passed - Valid encounter within last 12 months    Recent Outpatient Visits           2 months ago Depression, major, single episode, moderate (HCC)   [place] [facility] [place], Melanie DASEN, NP       Future Appointments             In 10 months Lomax, Amy, NP [facility]

## 2024-05-08 ENCOUNTER — Other Ambulatory Visit: Payer: Self-pay

## 2024-05-09 ENCOUNTER — Other Ambulatory Visit: Payer: Self-pay

## 2024-06-10 ENCOUNTER — Encounter: Payer: Self-pay | Admitting: Nurse Practitioner

## 2024-06-13 ENCOUNTER — Other Ambulatory Visit: Payer: Self-pay | Admitting: Nurse Practitioner

## 2024-06-13 ENCOUNTER — Other Ambulatory Visit: Payer: Self-pay

## 2024-06-14 ENCOUNTER — Other Ambulatory Visit: Payer: Self-pay

## 2024-06-14 MED ORDER — ZEPBOUND 10 MG/0.5ML ~~LOC~~ SOAJ
10.0000 mg | SUBCUTANEOUS | 1 refills | Status: AC
  Filled 2024-06-14: qty 2, 28d supply, fill #0

## 2024-06-14 NOTE — Telephone Encounter (Signed)
 Requested medication (s) are due for refill today: yes  Requested medication (s) are on the active medication list: yes  Last refill:  05/02/24  Future visit scheduled: yes  Notes to clinic:  Medication not assigned to a protocol, review manually      Requested Prescriptions  Pending Prescriptions Disp Refills   tirzepatide  (ZEPBOUND ) 10 MG/0.5ML Pen 2 mL 0    Sig: Inject 10 mg into the skin every Monday.     Off-Protocol Failed - 06/14/2024  1:50 PM      Failed - Medication not assigned to a protocol, review manually.      Passed - Valid encounter within last 12 months    Recent Outpatient Visits           4 months ago Depression, major, single episode, moderate (HCC)   [place] [facility] [place], Melanie DASEN, NP       Future Appointments             In 9 months Lomax, Amy, NP [facility]

## 2024-06-24 ENCOUNTER — Other Ambulatory Visit: Payer: Self-pay

## 2024-07-25 ENCOUNTER — Ambulatory Visit: Admitting: Nurse Practitioner

## 2024-08-05 ENCOUNTER — Ambulatory Visit: Admitting: Nurse Practitioner

## 2025-03-14 ENCOUNTER — Telehealth: Admitting: Family Medicine
